# Patient Record
Sex: Female | Born: 1955 | Race: White | Hispanic: No | Marital: Married | Smoking: Current every day smoker
Health system: Southern US, Community
[De-identification: ages and names within clinical notes are randomized; demographics above are authoritative.]

## PROBLEM LIST (undated history)

## (undated) DIAGNOSIS — I10 Essential (primary) hypertension: Secondary | ICD-10-CM

## (undated) DIAGNOSIS — Z720 Tobacco use: Secondary | ICD-10-CM

## (undated) DIAGNOSIS — R918 Other nonspecific abnormal finding of lung field: Secondary | ICD-10-CM

## (undated) DIAGNOSIS — E785 Hyperlipidemia, unspecified: Secondary | ICD-10-CM

## (undated) DIAGNOSIS — I2510 Atherosclerotic heart disease of native coronary artery without angina pectoris: Secondary | ICD-10-CM

## (undated) HISTORY — DX: Hyperlipidemia, unspecified: E78.5

---

## 1998-08-23 ENCOUNTER — Encounter: Payer: Self-pay | Admitting: *Deleted

## 1998-08-23 ENCOUNTER — Emergency Department (HOSPITAL_COMMUNITY): Admission: EM | Admit: 1998-08-23 | Discharge: 1998-08-23 | Payer: Self-pay | Admitting: Emergency Medicine

## 1999-05-08 ENCOUNTER — Encounter: Payer: Self-pay | Admitting: Family Medicine

## 1999-05-08 ENCOUNTER — Inpatient Hospital Stay (HOSPITAL_COMMUNITY): Admission: EM | Admit: 1999-05-08 | Discharge: 1999-05-09 | Payer: Self-pay | Admitting: Emergency Medicine

## 1999-05-08 ENCOUNTER — Encounter: Payer: Self-pay | Admitting: Emergency Medicine

## 1999-05-09 ENCOUNTER — Encounter: Payer: Self-pay | Admitting: Internal Medicine

## 1999-05-10 ENCOUNTER — Emergency Department (HOSPITAL_COMMUNITY): Admission: EM | Admit: 1999-05-10 | Discharge: 1999-05-10 | Payer: Self-pay | Admitting: Emergency Medicine

## 2001-06-04 ENCOUNTER — Other Ambulatory Visit: Admission: RE | Admit: 2001-06-04 | Discharge: 2001-06-04 | Payer: Self-pay | Admitting: *Deleted

## 2002-11-18 ENCOUNTER — Other Ambulatory Visit: Admission: RE | Admit: 2002-11-18 | Discharge: 2002-11-18 | Payer: Self-pay | Admitting: *Deleted

## 2003-12-01 ENCOUNTER — Other Ambulatory Visit: Admission: RE | Admit: 2003-12-01 | Discharge: 2003-12-01 | Payer: Self-pay | Admitting: *Deleted

## 2005-05-05 ENCOUNTER — Other Ambulatory Visit: Admission: RE | Admit: 2005-05-05 | Discharge: 2005-05-05 | Payer: Self-pay | Admitting: Family Medicine

## 2006-09-18 ENCOUNTER — Other Ambulatory Visit: Admission: RE | Admit: 2006-09-18 | Discharge: 2006-09-18 | Payer: Self-pay | Admitting: *Deleted

## 2008-03-09 ENCOUNTER — Other Ambulatory Visit: Admission: RE | Admit: 2008-03-09 | Discharge: 2008-03-09 | Payer: Self-pay | Admitting: Family Medicine

## 2009-10-12 ENCOUNTER — Other Ambulatory Visit: Admission: RE | Admit: 2009-10-12 | Discharge: 2009-10-12 | Payer: Self-pay | Admitting: Family Medicine

## 2010-08-16 NOTE — H&P (Signed)
[place]  Patient:    Kari Harrison, Kari Harrison                         MRN: [id number] Adm. Date:  [id number] Attending:  Lorre Nick                         History and Physical  DIAGNOSES: 1. Epigastric pain, rule out gallstones, rule out cardiac etiology. 2. Poorly controlled hypertension.  HISTORY OF PRESENT ILLNESS:  Kari Harrison is a 55 year old white female admitted with epigastric pain in usual state of health until about two days prior to admission.  She was kept awake all night with heartburn, Tums did not give relief. She had epigastric pain at that time, which was constant, radiating to the left  lateral chest at times, and food seemed to increase symptoms.  No previous history of similar pain.  No diaphoresis, was having nausea but no vomiting.  No fever,  constipation, diarrhea, melena, or hematochezia.  Family history positive for gallstones in mother.  She came to [facility] Room where it was felt there would be need for admission to rule out MI.  PAST MEDICAL HISTORY:  Hospitalizations, operations:  Childbirth x 2 in the past, one vaginal delivery, one cesarean section in 1985.  ______ history of hypertension but she simply stopped taking medicines a year or two ago and does not really followup on blood pressures.  ALLERGIES:  No known drug allergies.  SOCIAL HISTORY:  She smokes about two or three cigarettes a day for three years. No alcohol use.  Employed as an Film/video editor.  Mother and sister accompany her to the emergency room.  FAMILY HISTORY:  Mother with gallstones, sister with high blood pressure. Father with MI and died at age 11.  REVIEW OF SYSTEMS:  Normal cholesterol in past by patient history.  Denies any history of respiratory disease, diabetes, kidney, or liver disease.  LMP two weeks ago.  It is 28-day, approximately 6 days in length.  She states she has a history of a heart murmur and had an  echocardiogram last year that she states was normal, although report is not on the chart.  OBJECTIVE:  VITAL SIGNS:  Blood pressure 170/108 after p.o. clonidine, pulse 80.  GENERAL:  Alert, oriented x 3.  HEENT:  Normocephalic, atraumatic.  EOMI nonicteric.  Posterior pharynx clear.  NECK:  Without JVD.  Thyroid normal.  RESPIRATORY:  Clear.  HEART:  RRR.  Normal S1, S2 with a grade 2/6 early systolic ejection murmur at eft sternal border.  ABDOMEN:  Tender to palpate in the epigastric region but no rigidity, guarding r rebound.  No HSM.  Slightly decreased bowel sounds.  NEUROLOGICAL:  Intact.  EXTREMITIES:  Without CCE.  RECTAL:  Stool [place], guaiac-negative.  ADMISSION LABORATORY DATA:  White count mildly elevated at 12.1, normal hemoglobin 14.6.  Liver functions normal.  BUN 14, creatinine 0.9, sodium was 3.5. Troponin level normal at 0.03.  CK 31, MB fraction 0.9, is normal.  Chest x-ray:  No acute disease.  ECG:  Normal sinus rhythm.  LVH present by voltage criteria with T wave inversions in the inferior and lateral leads consistent with chronic processes such as LDH, although I cannot rule out acute process.  ASSESSMENT AND PLAN: 1. Epigastric discomfort, probably gallstones, arrange for ultrasound.  IV Demerol,    on Pepcid, n.p.o.  Doubt a cardiac etiology  but Dr. Logan Bores has been consulted.    Cardiac enzymes are pending.  Follow on a monitor bed. 2. Poorly controlled hypertension.  N.P.O. for now.  Begin IV Vasotec.  Check    thyroid functions.  May need repeat echocardiogram to evaluate heart murmur. DD:  05/08/99 TD:  05/08/99 Job: [id number]

## 2011-09-23 ENCOUNTER — Other Ambulatory Visit: Payer: Self-pay | Admitting: Family Medicine

## 2011-09-23 DIAGNOSIS — R109 Unspecified abdominal pain: Secondary | ICD-10-CM

## 2011-09-24 ENCOUNTER — Ambulatory Visit
Admission: RE | Admit: 2011-09-24 | Discharge: 2011-09-24 | Disposition: A | Discharge status: Home / Self Care | Payer: Managed Care, Other (non HMO) | Source: Ambulatory Visit | Attending: Family Medicine | Admitting: Family Medicine

## 2011-09-24 DIAGNOSIS — R109 Unspecified abdominal pain: Secondary | ICD-10-CM

## 2011-09-24 MED ORDER — IOHEXOL 300 MG/ML  SOLN
100.0000 mL | Freq: Once | INTRAMUSCULAR | Status: AC | PRN
  Administered 2011-09-24: 100 mL via INTRAVENOUS

## 2011-11-21 ENCOUNTER — Other Ambulatory Visit: Payer: Self-pay | Admitting: Family Medicine

## 2011-11-21 ENCOUNTER — Other Ambulatory Visit (HOSPITAL_COMMUNITY)
Admission: RE | Admit: 2011-11-21 | Discharge: 2011-11-21 | Disposition: A | Discharge status: Home / Self Care | Payer: Managed Care, Other (non HMO) | Source: Ambulatory Visit | Attending: Family Medicine | Admitting: Family Medicine

## 2011-11-21 DIAGNOSIS — M8580 Other specified disorders of bone density and structure, unspecified site: Secondary | ICD-10-CM

## 2011-11-21 DIAGNOSIS — Z124 Encounter for screening for malignant neoplasm of cervix: Secondary | ICD-10-CM | POA: Insufficient documentation

## 2011-11-21 DIAGNOSIS — Z1151 Encounter for screening for human papillomavirus (HPV): Secondary | ICD-10-CM | POA: Insufficient documentation

## 2011-11-21 DIAGNOSIS — Z1231 Encounter for screening mammogram for malignant neoplasm of breast: Secondary | ICD-10-CM

## 2012-01-16 ENCOUNTER — Ambulatory Visit
Admission: RE | Admit: 2012-01-16 | Discharge: 2012-01-16 | Disposition: A | Discharge status: Home / Self Care | Payer: Managed Care, Other (non HMO) | Source: Ambulatory Visit | Attending: Family Medicine | Admitting: Family Medicine

## 2012-01-16 DIAGNOSIS — M8580 Other specified disorders of bone density and structure, unspecified site: Secondary | ICD-10-CM

## 2012-01-16 DIAGNOSIS — Z1231 Encounter for screening mammogram for malignant neoplasm of breast: Secondary | ICD-10-CM

## 2013-12-23 ENCOUNTER — Other Ambulatory Visit: Payer: Self-pay | Admitting: Family Medicine

## 2013-12-23 DIAGNOSIS — M8580 Other specified disorders of bone density and structure, unspecified site: Secondary | ICD-10-CM

## 2013-12-23 DIAGNOSIS — Z1231 Encounter for screening mammogram for malignant neoplasm of breast: Secondary | ICD-10-CM

## 2014-01-27 ENCOUNTER — Ambulatory Visit
Admission: RE | Admit: 2014-01-27 | Discharge: 2014-01-27 | Disposition: A | Discharge status: Home / Self Care | Payer: PRIVATE HEALTH INSURANCE | Source: Ambulatory Visit | Attending: Family Medicine | Admitting: Family Medicine

## 2014-01-27 DIAGNOSIS — M8580 Other specified disorders of bone density and structure, unspecified site: Secondary | ICD-10-CM

## 2014-01-27 DIAGNOSIS — Z1231 Encounter for screening mammogram for malignant neoplasm of breast: Secondary | ICD-10-CM

## 2014-06-14 ENCOUNTER — Inpatient Hospital Stay (HOSPITAL_COMMUNITY)
Admission: EM | Admit: 2014-06-14 | Discharge: 2014-06-17 | DRG: 247 | Disposition: A | Discharge status: Home / Self Care | Payer: 59 | Attending: Cardiology | Admitting: Cardiology

## 2014-06-14 ENCOUNTER — Emergency Department (HOSPITAL_COMMUNITY): Payer: 59

## 2014-06-14 ENCOUNTER — Encounter (HOSPITAL_COMMUNITY): Payer: Self-pay | Admitting: Emergency Medicine

## 2014-06-14 DIAGNOSIS — R079 Chest pain, unspecified: Secondary | ICD-10-CM

## 2014-06-14 DIAGNOSIS — Z7902 Long term (current) use of antithrombotics/antiplatelets: Secondary | ICD-10-CM

## 2014-06-14 DIAGNOSIS — Z8249 Family history of ischemic heart disease and other diseases of the circulatory system: Secondary | ICD-10-CM

## 2014-06-14 DIAGNOSIS — Z720 Tobacco use: Secondary | ICD-10-CM

## 2014-06-14 DIAGNOSIS — Z7982 Long term (current) use of aspirin: Secondary | ICD-10-CM

## 2014-06-14 DIAGNOSIS — F17210 Nicotine dependence, cigarettes, uncomplicated: Secondary | ICD-10-CM | POA: Diagnosis present

## 2014-06-14 DIAGNOSIS — R918 Other nonspecific abnormal finding of lung field: Secondary | ICD-10-CM | POA: Diagnosis present

## 2014-06-14 DIAGNOSIS — I10 Essential (primary) hypertension: Secondary | ICD-10-CM | POA: Diagnosis present

## 2014-06-14 DIAGNOSIS — I25110 Atherosclerotic heart disease of native coronary artery with unstable angina pectoris: Principal | ICD-10-CM | POA: Diagnosis present

## 2014-06-14 DIAGNOSIS — I2510 Atherosclerotic heart disease of native coronary artery without angina pectoris: Secondary | ICD-10-CM

## 2014-06-14 DIAGNOSIS — I200 Unstable angina: Secondary | ICD-10-CM

## 2014-06-14 DIAGNOSIS — Z79899 Other long term (current) drug therapy: Secondary | ICD-10-CM

## 2014-06-14 DIAGNOSIS — I2584 Coronary atherosclerosis due to calcified coronary lesion: Secondary | ICD-10-CM | POA: Diagnosis present

## 2014-06-14 HISTORY — DX: Tobacco use: Z72.0

## 2014-06-14 HISTORY — DX: Essential (primary) hypertension: I10

## 2014-06-14 HISTORY — DX: Atherosclerotic heart disease of native coronary artery without angina pectoris: I25.10

## 2014-06-14 HISTORY — DX: Other nonspecific abnormal finding of lung field: R91.8

## 2014-06-14 LAB — CREATININE, SERUM
CREATININE: 1.06 mg/dL (ref 0.50–1.10)
GFR calc Af Amer: 65 mL/min — ABNORMAL LOW (ref >90)
GFR, EST NON AFRICAN AMERICAN: 56 mL/min — AB (ref >90)

## 2014-06-14 LAB — I-STAT TROPONIN, ED
TROPONIN I, POC: 0.01 ng/mL (ref 0.00–0.08)
Troponin i, poc: 0 ng/mL (ref 0.00–0.08)

## 2014-06-14 LAB — BASIC METABOLIC PANEL
Anion gap: 8 (ref 5–15)
BUN: 14 mg/dL (ref 6–23)
CO2: 28 mmol/L (ref 19–32)
Calcium: 9.1 mg/dL (ref 8.4–10.5)
Chloride: 103 mmol/L (ref 96–112)
Creatinine, Ser: 0.91 mg/dL (ref 0.50–1.10)
GFR calc Af Amer: 78 mL/min — ABNORMAL LOW (ref >90)
GFR, EST NON AFRICAN AMERICAN: 68 mL/min — AB (ref >90)
GLUCOSE: 101 mg/dL — AB (ref 70–99)
Potassium: 3.4 mmol/L — ABNORMAL LOW (ref 3.5–5.1)
Sodium: 139 mmol/L (ref 135–145)

## 2014-06-14 LAB — CBC
HCT: 42.4 % (ref 36.0–46.0)
HEMATOCRIT: 43.3 % (ref 36.0–46.0)
Hemoglobin: 14.6 g/dL (ref 12.0–15.0)
Hemoglobin: 14.1 g/dL (ref 12.0–15.0)
MCH: 31.8 pg (ref 26.0–34.0)
MCH: 31.2 pg (ref 26.0–34.0)
MCHC: 33.7 g/dL (ref 30.0–36.0)
MCHC: 33.3 g/dL (ref 30.0–36.0)
MCV: 94.3 fL (ref 78.0–100.0)
MCV: 93.8 fL (ref 78.0–100.0)
PLATELETS: 295 10*3/uL (ref 150–400)
Platelets: 295 10*3/uL (ref 150–400)
RBC: 4.59 MIL/uL (ref 3.87–5.11)
RBC: 4.52 MIL/uL (ref 3.87–5.11)
RDW: 13.6 % (ref 11.5–15.5)
RDW: 13.6 % (ref 11.5–15.5)
WBC: 16.2 10*3/uL — AB (ref 4.0–10.5)
WBC: 13.8 10*3/uL — ABNORMAL HIGH (ref 4.0–10.5)

## 2014-06-14 LAB — TROPONIN I
Troponin I: <0.03 ng/mL (ref <0.031)
Troponin I: <0.03 ng/mL (ref <0.031)

## 2014-06-14 LAB — HEPATIC FUNCTION PANEL
ALBUMIN: 3.3 g/dL — AB (ref 3.5–5.2)
ALT: 18 U/L (ref 0–35)
AST: 20 U/L (ref 0–37)
Alkaline Phosphatase: 80 U/L (ref 39–117)
Bilirubin, Direct: 0.3 mg/dL (ref 0.0–0.5)
Indirect Bilirubin: 0.8 mg/dL (ref 0.3–0.9)
Total Bilirubin: 1.1 mg/dL (ref 0.3–1.2)
Total Protein: 6.2 g/dL (ref 6.0–8.3)

## 2014-06-14 MED ORDER — HEPARIN SODIUM (PORCINE) 5000 UNIT/ML IJ SOLN
5000.0000 [IU] | Freq: Three times a day (TID) | INTRAMUSCULAR | Status: DC
  Filled 2014-06-14: qty 1

## 2014-06-14 MED ORDER — NITROGLYCERIN 0.4 MG SL SUBL: 0.4000 mg | SUBLINGUAL_TABLET | SUBLINGUAL | Status: DC | PRN

## 2014-06-14 MED ORDER — VENLAFAXINE HCL ER 75 MG PO CP24
75.0000 mg | ORAL_CAPSULE | Freq: Every day | ORAL | Status: DC
  Administered 2014-06-16 – 2014-06-17 (×2): 75 mg via ORAL
  Filled 2014-06-14 (×5): qty 1

## 2014-06-14 MED ORDER — ATORVASTATIN CALCIUM 10 MG PO TABS: 10.0000 mg | ORAL_TABLET | Freq: Every day | ORAL | Status: DC

## 2014-06-14 MED ORDER — ASPIRIN EC 81 MG PO TBEC
81.0000 mg | DELAYED_RELEASE_TABLET | Freq: Every day | ORAL | Status: DC
  Filled 2014-06-14: qty 1

## 2014-06-14 MED ORDER — SODIUM CHLORIDE 0.9 % IV BOLUS (SEPSIS)
500.0000 mL | Freq: Once | INTRAVENOUS | Status: AC
  Administered 2014-06-14: 500 mL via INTRAVENOUS

## 2014-06-14 MED ORDER — ATENOLOL 50 MG PO TABS
50.0000 mg | ORAL_TABLET | Freq: Every day | ORAL | Status: DC
  Administered 2014-06-16 – 2014-06-17 (×2): 50 mg via ORAL
  Filled 2014-06-14: qty 2
  Filled 2014-06-14 (×2): qty 1

## 2014-06-14 MED ORDER — ACETAMINOPHEN 325 MG PO TABS: 650.0000 mg | ORAL_TABLET | ORAL | Status: DC | PRN

## 2014-06-14 MED ORDER — ONDANSETRON HCL 4 MG/2ML IJ SOLN: 4.0000 mg | Freq: Four times a day (QID) | INTRAMUSCULAR | Status: DC | PRN

## 2014-06-14 NOTE — ED Notes (Signed)
Cardiology at bedside.

## 2014-06-14 NOTE — Consult Note (Signed)
CARDIOLOGY CONSULT NOTE  Patient ID: Kari Harrison, MRN: [id number], DOB/AGE: 07/20/1955 59 y.o. Admit date: 06/14/2014 Date of Consult: 06/14/2014  Primary Physician: No primary care provider on file. Primary Cardiologist: None Referring Physician: ED Physician   Chief Complaint: Chest Pain Reason for Consultation: Chest Pain   HPI:  Kari Harrison is a 59yo woman with PMHx of HTN who presents to the ED with chest pain. 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 She describes the pain as "tight and heavy", radiating to her right chest, lasting about 15 minutes, and constant. She states after the pain started she went to lay down in the back of the store, but this did not reliever her pain. She then took 6 baby aspirin and this relieved her pain 10 minutes later. She describes associated diaphoresis, but denies dyspnea, nausea, vomiting, palpitations. She denies any chest pain or dyspnea with exertion. She had a nuclear stress test in 2001 which showed EF 55% and no evidence of ischemia or infarction. Of note, patient reports she had been to the dentist this morning for a tooth filling and received laughing gas and novacaine for the procedure. She states she is a smoker and has smoked 1/2 ppd for the last 20 years.   Medical History:  Past Medical History  Diagnosis Date  . Hypertension       Surgical History:  Past Surgical History  Procedure Laterality Date  . Cesarean section       Home Meds: Prior to Admission medications   Medication Sig Start Date End Date Taking? Authorizing Provider  aspirin 81 MG tablet Take 486 mg by mouth once.    Yes Historical Provider, MD  atenolol (TENORMIN) 50 MG tablet Take 50 mg by mouth daily. 06/12/14  Yes Historical Provider, MD  atorvastatin (LIPITOR) 10 MG tablet Take 10 mg by mouth daily. 06/12/14  Yes Historical Provider, MD    lisinopril-hydrochlorothiazide (PRINZIDE,ZESTORETIC) 20-12.5 MG per tablet Take 2 tablets by mouth daily. 06/12/14  Yes Historical Provider, MD  venlafaxine XR (EFFEXOR-XR) 75 MG 24 hr capsule Take 75 mg by mouth daily. 06/12/14  Yes Historical Provider, MD   Allergies: No Known Allergies  History   Social History  . Marital Status: Married    Spouse Name: N/A  . Number of Children: N/A  . Years of Education: N/A   Occupational History  . Not on file.   Social History Main Topics  . Smoking status: Current Every Day Smoker    Types: Cigarettes  . Smokeless tobacco: Not on file  . Alcohol Use: No  . Drug Use: Not on file  . Sexual Activity: Not on file   Other Topics Concern  . Not on file   Social History Narrative  . No narrative on file     No family history on file.   Review of Systems: General: negative for chills, fever, night sweats or weight changes.  ENT: negative for rhinorrhea or epistaxis Cardiovascular: See HPI Dermatological: negative for rash Respiratory: negative for cough or wheezing GI: negative for diarrhea, bright red blood per rectum, melena, or hematemesis GU: no hematuria, urgency, or frequency Neurologic: negative for visual changes, syncope, headache, or dizziness Heme: no easy bruising or bleeding Endo: negative for excessive thirst, thyroid disorder, or flushing Musculoskeletal: negative for joint pain or swelling, negative for myalgias  All other systems reviewed and are otherwise  negative except as noted above.  Physical Exam: Blood pressure 92/56, pulse 65, temperature 98.8 F (37.1 C), temperature source Oral, resp. rate 19, height 5\' 2"  (1.575 m), weight 170 lb (77.111 kg), SpO2 96 %. General: alert, sitting up in bed, NAD HEENT: [place]/AT, EOMI, mucus membranes moist Neck: supple, no JVD CV: RRR, no m/g/r Pulm: CTA bilaterally, breaths non-labored Abd: BS+, soft, non-tender, non-distended  Ext: warm, no edema Neuro: alert and  oriented x 3, no focal deficits     Labs: No results for input(s): CKTOTAL, CKMB, TROPONINI in the last 72 hours. Lab Results  Component Value Date   WBC 16.2* 06/14/2014   HGB 14.6 06/14/2014   HCT 43.3 06/14/2014   MCV 94.3 06/14/2014   PLT 295 06/14/2014    Recent Labs Lab 06/14/14 1343  NA 139  K 3.4*  CL 103  CO2 28  BUN 14  CREATININE 0.91  CALCIUM 9.1  PROT 6.2  BILITOT 1.1  ALKPHOS 80  ALT 18  AST 20  GLUCOSE 101*   Radiology/Studies:  Dg Chest Port 1 View  06/14/2014   CLINICAL DATA:  Mid chest pain and some shortness of breath. Bronchitis 3 weeks ago. Smoker.  EXAM: PORTABLE CHEST - 1 VIEW  COMPARISON:  05/09/2009.  FINDINGS: Normal sized heart. Clear lungs. Mildly prominent pulmonary vasculature. No pleural fluid. Unremarkable bones.  IMPRESSION: Interval mild pulmonary vascular congestion.   Electronically Signed   By: Beckie SaltsSteven  Reid M.D.   On: 06/14/2014 13:43    EKG:  Sinus rhythm, T wave inversions in I, II, V2-V6, prolonged QTc, no prior to compare   Cardiac Studies: None   ASSESSMENT AND PLAN:   Chest Pain: Patient presented with acute onset substernal chest pain at rest concerning for unstable angina. Initial istat troponin negative, but EKG shows T wave inversions. Chest x-ray with pulmonary edema, but no evidence of fluid overload on exam. Will trend troponins and admit to cardiology service. May need cath if chest pain reoccurs or troponin becomes elevated.   Paula ComptonSigned, Rivet, Carly MD 06/14/2014, 3:30 PM   Patient seen with resident, agree with the above note.  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  No prior chest pain.  Baseline good exercise tolerance.  She has multiple cardiac RFs including HTN, smoking, hyperlipidemia, and a father with MI at 3336.  Her ECG shows rather diffuse T wave inversions (no comparison). Initial troponin is negative.  Given her symptoms,  ECG, and RFs, I am concerned that her pain may have been ischemic.  - Admit and cycle enzymes.   - Will keep NPO at midnight. If troponin remains negative, will need Cardiolite or cardiac CTA.  If troponin positive, will need cardiac cath.  - Treat with ASA, statin, atenolol (home med) for now.  If she has recurrent pain or troponin returns positive, she will need to go on heparin gtt.   Marca AnconaDalton McLean 06/14/2014 4:47 PM

## 2014-06-14 NOTE — ED Provider Notes (Signed)
CSN: [id number]     Arrival date & time 06/14/14  1258 History   First MD Initiated Contact with Patient 06/14/14 1307     Chief Complaint  Patient presents with  . Chest Pain     Patient is a 59 y.o. female presenting with chest pain. The history is provided by the patient. No language interpreter was used.  Chest Pain  Ms. Scogin presents for evaluation of chest pain.  Pain started about two hours ago.  The pain is described as pressing and tight from the center of the chest and right side of chest.  She has associated diaphoresis, pallor.  She took six baby aspirin prior to EMS arrival.  The pain lasted about fifteen minutes and occurred at rest.  No associated SOB, abdominal pain, nausea, vomiting, leg swelling or pain.  Pain is completely resolved currently.  She had a tooth filled this morning at the dentist office and had laughing gas and novacaine.  She does not take any oral estrogen therapy. No history of cardiac disease, hyperlipidemia, DVT or PE.   Past Medical History  Diagnosis Date  . Hypertension    Past Surgical History  Procedure Laterality Date  . Cesarean section     No family history on file. History  Substance Use Topics  . Smoking status: Current Every Day Smoker    Types: Cigarettes  . Smokeless tobacco: Not on file  . Alcohol Use: No   OB History    No data available     Review of Systems  Cardiovascular: Positive for chest pain.  All other systems reviewed and are negative.     Allergies  Review of patient's allergies indicates no known allergies.  Home Medications   Prior to Admission medications   Not on File   BP 99/69 mmHg  Temp(Src) 98.8 F (37.1 C) (Oral)  Resp 16  Ht  (1.575 m)  Wt 170 lb (77.111 kg)  BMI 31.09 kg/m2  SpO2 91% Physical Exam  Constitutional: She is oriented to person, place, and time. She appears well-developed and well-nourished.  HENT:  Head: Normocephalic and atraumatic.  Cardiovascular: Normal rate  and regular rhythm.   No murmur heard. Pulmonary/Chest: Effort normal and breath sounds normal. No respiratory distress.  Abdominal: Soft. There is no tenderness. There is no rebound and no guarding.  Musculoskeletal: She exhibits no edema or tenderness.  Neurological: She is alert and oriented to person, place, and time.  Skin: Skin is warm and dry.  Psychiatric: She has a normal mood and affect. Her behavior is normal.  Nursing note and vitals reviewed.   ED Course  Procedures (including critical care time) Labs Review Labs Reviewed  CBC - Abnormal; Notable for the following:    WBC 16.2 (*)    All other components within normal limits  BASIC METABOLIC PANEL - Abnormal; Notable for the following:    Potassium 3.4 (*)    Glucose, Bld 101 (*)    GFR calc non Af Amer 68 (*)    GFR calc Af Amer 78 (*)    All other components within normal limits  HEPATIC FUNCTION PANEL - Abnormal; Notable for the following:    Albumin 3.3 (*)    All other components within normal limits  Rosezena Sensor, ED    Imaging Review Dg Chest Port 1 View  06/14/2014   CLINICAL DATA:  Mid chest pain and some shortness of breath. Bronchitis 3 weeks ago. Smoker.  EXAM: PORTABLE CHEST -  1 VIEW  COMPARISON:  05/09/2009.  FINDINGS: Normal sized heart. Clear lungs. Mildly prominent pulmonary vasculature. No pleural fluid. Unremarkable bones.  IMPRESSION: Interval mild pulmonary vascular congestion.   Electronically Signed   By: Beckie SaltsSteven  Reid M.D.   On: 06/14/2014 13:43     EKG Interpretation   Date/Time:  Wednesday June 14 2014 13:04:20 EDT Ventricular Rate:  69 PR Interval:  154 QRS Duration: 80 QT Interval:  535 QTC Calculation: 573 R Axis:   46 Text Interpretation:  Sinus rhythm Repol abnrm suggests ischemia,  anterolateral Prolonged QT interval Confirmed by Lincoln Brighamees, Liz [phone]([id number]) on  06/14/2014 1:28:20 PM      MDM   Final diagnoses:  Chest pain, unspecified chest pain type    Patient here for  valuation of episodic chest pain currently resolved, EKG is abnormal with T-wave inversion in lateral leads. Patient does have prior EKG with similar but slightly different changes. CBC with leukocytosis but there is no current evidence of acute infectious process, clinical picture is not consistent with CHF. Patient received ASA prior to ED arrival. Nitroglycerin ointment held given patient's marginal blood pressures. Discussed with cards regarding evaluation for ACS rule out.  Tilden FossaElizabeth Jean Skow, MD 06/14/14 [phone]

## 2014-06-14 NOTE — ED Notes (Addendum)
EMS - Patient was a work with sudden onset of central and right sided chest pain that lasted 15-20 minutes in duration.  Initially 10/10 pain on arrival to ED 0/10 pain.  Patient took 324mg  Aspirin prior to calling EMS. Pt states she has no pain at this time. Denies any N/V.

## 2014-06-15 ENCOUNTER — Observation Stay (HOSPITAL_COMMUNITY): Payer: 59

## 2014-06-15 ENCOUNTER — Encounter (HOSPITAL_COMMUNITY): Payer: Self-pay | Admitting: Interventional Cardiology

## 2014-06-15 DIAGNOSIS — R079 Chest pain, unspecified: Secondary | ICD-10-CM

## 2014-06-15 DIAGNOSIS — I209 Angina pectoris, unspecified: Secondary | ICD-10-CM | POA: Insufficient documentation

## 2014-06-15 DIAGNOSIS — I2510 Atherosclerotic heart disease of native coronary artery without angina pectoris: Secondary | ICD-10-CM

## 2014-06-15 DIAGNOSIS — Z720 Tobacco use: Secondary | ICD-10-CM

## 2014-06-15 LAB — BASIC METABOLIC PANEL
Anion gap: 5 (ref 5–15)
BUN: 14 mg/dL (ref 6–23)
CO2: 28 mmol/L (ref 19–32)
Calcium: 8.9 mg/dL (ref 8.4–10.5)
Chloride: 105 mmol/L (ref 96–112)
Creatinine, Ser: 0.85 mg/dL (ref 0.50–1.10)
GFR calc Af Amer: 85 mL/min — ABNORMAL LOW (ref >90)
GFR calc non Af Amer: 74 mL/min — ABNORMAL LOW (ref >90)
Glucose, Bld: 117 mg/dL — ABNORMAL HIGH (ref 70–99)
Potassium: 3.4 mmol/L — ABNORMAL LOW (ref 3.5–5.1)
Sodium: 138 mmol/L (ref 135–145)

## 2014-06-15 LAB — TROPONIN I: Troponin I: <0.03 ng/mL (ref <0.031)

## 2014-06-15 LAB — PROTIME-INR
INR: 0.98 (ref 0.00–1.49)
Prothrombin Time: 13.1 seconds (ref 11.6–15.2)

## 2014-06-15 MED ORDER — METOPROLOL TARTRATE 1 MG/ML IV SOLN: 5.0000 mg | Freq: Once | INTRAVENOUS | Status: DC

## 2014-06-15 MED ORDER — SODIUM CHLORIDE 0.9 % IV SOLN
1.0000 mL/kg/h | INTRAVENOUS | Status: DC
  Administered 2014-06-15 – 2014-06-16 (×2): 1 mL/kg/h via INTRAVENOUS

## 2014-06-15 MED ORDER — SODIUM CHLORIDE 0.9 % IV SOLN: 250.0000 mL | INTRAVENOUS | Status: DC | PRN

## 2014-06-15 MED ORDER — NITROGLYCERIN 0.4 MG SL SUBL: 0.8000 mg | SUBLINGUAL_TABLET | Freq: Once | SUBLINGUAL | Status: DC

## 2014-06-15 MED ORDER — ENOXAPARIN SODIUM 80 MG/0.8ML ~~LOC~~ SOLN
80.0000 mg | SUBCUTANEOUS | Status: AC
  Administered 2014-06-15: 80 mg via SUBCUTANEOUS
  Filled 2014-06-15: qty 0.8

## 2014-06-15 MED ORDER — SODIUM CHLORIDE 0.9 % IJ SOLN: 3.0000 mL | INTRAMUSCULAR | Status: DC | PRN

## 2014-06-15 MED ORDER — SODIUM CHLORIDE 0.9 % IJ SOLN
3.0000 mL | Freq: Two times a day (BID) | INTRAMUSCULAR | Status: DC
  Administered 2014-06-16: 3 mL via INTRAVENOUS

## 2014-06-15 MED ORDER — IOHEXOL 350 MG/ML SOLN
80.0000 mL | Freq: Once | INTRAVENOUS | Status: AC | PRN
  Administered 2014-06-15: 80 mL via INTRAVENOUS

## 2014-06-15 MED ORDER — METOPROLOL TARTRATE 1 MG/ML IV SOLN
INTRAVENOUS | Status: AC
  Filled 2014-06-15: qty 5

## 2014-06-15 MED ORDER — NITROGLYCERIN 0.4 MG SL SUBL
SUBLINGUAL_TABLET | SUBLINGUAL | Status: AC
  Administered 2014-06-15: 0.4 mg via SUBLINGUAL
  Filled 2014-06-15: qty 2

## 2014-06-15 MED ORDER — ENOXAPARIN SODIUM 80 MG/0.8ML ~~LOC~~ SOLN
80.0000 mg | Freq: Two times a day (BID) | SUBCUTANEOUS | Status: DC
  Filled 2014-06-15 (×2): qty 0.8

## 2014-06-15 NOTE — Progress Notes (Signed)
Patient Name: Kari Harrison Date of Encounter: 06/15/2014     Active Problems:   Chest pain    SUBJECTIVE  No further CP. She would like to go home but willing to stay for further cardiac work up/  CURRENT MEDS . aspirin EC  81 mg Oral Daily  . atenolol  50 mg Oral Daily  . atorvastatin  10 mg Oral Daily  . heparin  5,000 Units Subcutaneous 3 times per day  . venlafaxine XR  75 mg Oral Daily    OBJECTIVE  Filed Vitals:   06/14/14 1630 06/14/14 1830 06/14/14 1935 06/15/14 0500  BP: 110/71 102/58 107/57 124/77  Pulse: 73 73 68 66  Temp:   98.5 F (36.9 C) 98.1 F (36.7 C)  TempSrc:   Oral Oral  Resp: 12 16    Height:    5\' 2"  (1.575 m)  Weight:    170 lb (77.111 kg)  SpO2: 97% 97% 96% 98%    Intake/Output Summary (Last 24 hours) at 06/15/14 0818 Last data filed at 06/14/14 1617  Gross per 24 hour  Intake    500 ml  Output      0 ml  Net    500 ml   Filed Weights   06/14/14 1313 06/15/14 0500  Weight: 170 lb (77.111 kg) 170 lb (77.111 kg)    PHYSICAL EXAM  General: Pleasant, NAD. Neuro: Alert and oriented X 3. Moves all extremities spontaneously. Psych: Normal affect. HEENT:  Normal  Neck: Supple without bruits or JVD. Lungs:  Resp regular and unlabored, CTA. Heart: brady no s3, s4, or murmurs. Abdomen: Soft, non-tender, non-distended, BS + x 4.  Extremities: No clubbing, cyanosis or edema. DP/PT/Radials 2+ and equal bilaterally.  Accessory Clinical Findings  CBC  Recent Labs  06/14/14 1343 06/14/14 2233  WBC 16.2* 13.8*  HGB 14.6 14.1  HCT 43.3 42.4  MCV 94.3 93.8  PLT 295 295   Basic Metabolic Panel  Recent Labs  06/14/14 1343 06/14/14 2233  NA 139  --   K 3.4*  --   CL 103  --   CO2 28  --   GLUCOSE 101*  --   BUN 14  --   CREATININE 0.91 1.06  CALCIUM 9.1  --    Liver Function Tests  Recent Labs  06/14/14 1343  AST 20  ALT 18  ALKPHOS 80  BILITOT 1.1  PROT 6.2  ALBUMIN 3.3*   No results for input(s): LIPASE,  AMYLASE in the last 72 hours. Cardiac Enzymes  Recent Labs  06/14/14 1633 06/14/14 2233 06/15/14 0417  TROPONINI <0.03 <0.03 <0.03    TELE  NSR with sinus brady  Radiology/Studies  Dg Chest Port 1 View  06/14/2014   CLINICAL DATA:  Mid chest pain and some shortness of breath. Bronchitis 3 weeks ago. Smoker.  EXAM: PORTABLE CHEST - 1 VIEW  COMPARISON:  05/09/2009.  FINDINGS: Normal sized heart. Clear lungs. Mildly prominent pulmonary vasculature. No pleural fluid. Unremarkable bones.  IMPRESSION: Interval mild pulmonary vascular congestion.   Electronically Signed   By: Beckie SaltsSteven  Reid M.D.   On: 06/14/2014 13:43    ASSESSMENT AND PLAN  Kari Harrison is a 59 y.o. female with a history of HTN, smoking, hyperlipidemia, and a father with MI at 3936 who [facility] with chest pain concerning for [place].   Chest pain  -- Troponin neg x3. ECG shows rather diffuse T wave inversions (no comparison).  -- Given her symptoms,  ECG, and RFs, there is concern that her pain may have been ischemic.  -- Per Dr. Shirlee Latch: If troponin remains negative, will need Cardiolite or cardiac CTA. If troponin positive, will need cardiac cath.  -- Continue ASA, statin, atenolol (home med) for now. -- Will arrange for inpatient Cardiac CT. Her HR is sinus brady at baseline on home atenelol. Will have 18G IV placed. I have spoke to Dr. Delton See who will read this.   Incidental pulmonary nodules seen on Cardiac CT overread- still awaiting report or coronary status. -- "Several pulmonary nodules in the left lung, as detailed above. While it is possible that these could be infectious/inflammatory, or related to areas of mucoid impaction within distal bronchioles, the possibility of malignancy should be considered. At this time, a repeat noncontrast chest CT is recommended in 3 months to assess for the stability or resolution of these findings."   Signed, Janetta Hora PA-C  Pager [phone]

## 2014-06-15 NOTE — Progress Notes (Signed)
Pt states she has taken her own home meds this evening.  Her husband brought them and she took them at 1830 today.  She took all her meds except lisinopril.  She took: Aspirin 81mg , Atenolol 50mg , Lipitor 10mg , Effexor-XR 75mg .  She also took Vitamin C and Vitamin D.  Pt was educated on the importance of letting the RN give her any and all medications.  Pt apologized and stated she did not know the policy but will not take her own meds on her own while hospitalized.

## 2014-06-15 NOTE — Progress Notes (Signed)
ANTICOAGULATION CONSULT NOTE - Initial Consult  Pharmacy Consult for Lovenox Indication: chest pain/ACS  No Known Allergies  Patient Measurements: Height: 5\' 2"  (157.5 cm) Weight: 170 lb (77.111 kg) IBW/kg (Calculated) : 50.1  Vital Signs: Temp: 98.6 F (37 C) (03/17 1327) Temp Source: Oral (03/17 1327) BP: 96/66 mmHg (03/17 1327) Pulse Rate: 59 (03/17 1327)  Labs:  Recent Labs  06/14/14 1343 06/14/14 1633 06/14/14 2233 06/15/14 0417  HGB 14.6  --  14.1  --   HCT 43.3  --  42.4  --   PLT 295  --  295  --   CREATININE 0.91  --  1.06  --   TROPONINI  --  <0.03 <0.03 <0.03    Estimated Creatinine Clearance: 54.9 mL/min (by C-G formula based on Cr of 1.06).   Medical History: Past Medical History  Diagnosis Date  . Hypertension   . CAD in native artery 06/15/2014    Medications:  Prescriptions prior to admission  Medication Sig Dispense Refill Last Dose  . aspirin 81 MG tablet Take 486 mg by mouth once.    06/14/2014 at Unknown time  . atenolol (TENORMIN) 50 MG tablet Take 50 mg by mouth daily.  0 06/13/2014 at 2330  . atorvastatin (LIPITOR) 10 MG tablet Take 10 mg by mouth daily.  0 06/13/2014 at Unknown time  . lisinopril-hydrochlorothiazide (PRINZIDE,ZESTORETIC) 20-12.5 MG per tablet Take 2 tablets by mouth daily.  0 06/13/2014 at Unknown time  . venlafaxine XR (EFFEXOR-XR) 75 MG 24 hr capsule Take 75 mg by mouth daily.  0 06/13/2014 at Unknown time    Assessment: 59 y/o female who presented to the ED with chest pain concerning for unstable angina. Pharmacy consulted to begin Lovenox. Plan is for cath. SQ heparin was ordered but none was given. No bleeding noted, CBC is normal, troponin is neg x3. Renal function is normal.  Goal of Therapy:  Anti-Xa level 0.6-1 units/ml 4hrs after LMWH dose given Monitor platelets by anticoagulation protocol: Yes   Plan:  - Lovenox 80 mg SQ q12h - CBC q72h while on Lovenox - Monitor for s/sx of bleeding - F/u after cath  tomorrow  [facility]ist Medical CenterJennifer Poland, Pharm.D., BCPS Clinical Pharmacist Pager: [phone] 06/15/2014 7:52 PM

## 2014-06-15 NOTE — Progress Notes (Signed)
UR completed 

## 2014-06-16 ENCOUNTER — Encounter (HOSPITAL_COMMUNITY)
Admission: EM | Disposition: A | Discharge status: Home / Self Care | Payer: PRIVATE HEALTH INSURANCE | Source: Home / Self Care | Attending: Cardiology

## 2014-06-16 ENCOUNTER — Encounter (HOSPITAL_COMMUNITY): Payer: Self-pay | Admitting: Internal Medicine

## 2014-06-16 DIAGNOSIS — Z8249 Family history of ischemic heart disease and other diseases of the circulatory system: Secondary | ICD-10-CM | POA: Diagnosis not present

## 2014-06-16 DIAGNOSIS — Z7902 Long term (current) use of antithrombotics/antiplatelets: Secondary | ICD-10-CM | POA: Diagnosis not present

## 2014-06-16 DIAGNOSIS — I200 Unstable angina: Secondary | ICD-10-CM

## 2014-06-16 DIAGNOSIS — R079 Chest pain, unspecified: Secondary | ICD-10-CM | POA: Diagnosis present

## 2014-06-16 DIAGNOSIS — I2584 Coronary atherosclerosis due to calcified coronary lesion: Secondary | ICD-10-CM | POA: Diagnosis present

## 2014-06-16 DIAGNOSIS — F17210 Nicotine dependence, cigarettes, uncomplicated: Secondary | ICD-10-CM | POA: Diagnosis present

## 2014-06-16 DIAGNOSIS — I25110 Atherosclerotic heart disease of native coronary artery with unstable angina pectoris: Principal | ICD-10-CM

## 2014-06-16 DIAGNOSIS — R918 Other nonspecific abnormal finding of lung field: Secondary | ICD-10-CM | POA: Diagnosis present

## 2014-06-16 DIAGNOSIS — I10 Essential (primary) hypertension: Secondary | ICD-10-CM | POA: Diagnosis present

## 2014-06-16 DIAGNOSIS — I2510 Atherosclerotic heart disease of native coronary artery without angina pectoris: Secondary | ICD-10-CM | POA: Diagnosis not present

## 2014-06-16 DIAGNOSIS — I209 Angina pectoris, unspecified: Secondary | ICD-10-CM | POA: Diagnosis not present

## 2014-06-16 DIAGNOSIS — Z7982 Long term (current) use of aspirin: Secondary | ICD-10-CM | POA: Diagnosis not present

## 2014-06-16 DIAGNOSIS — Z79899 Other long term (current) drug therapy: Secondary | ICD-10-CM | POA: Diagnosis not present

## 2014-06-16 HISTORY — PX: LEFT HEART CATHETERIZATION WITH CORONARY ANGIOGRAM: SHX5451

## 2014-06-16 LAB — LIPID PANEL
CHOL/HDL RATIO: 4 ratio
CHOLESTEROL: 136 mg/dL (ref 0–200)
HDL: 34 mg/dL — AB (ref >39)
LDL Cholesterol: 76 mg/dL (ref 0–99)
Triglycerides: 129 mg/dL (ref <150)
VLDL: 26 mg/dL (ref 0–40)

## 2014-06-16 LAB — BASIC METABOLIC PANEL
Anion gap: 5 (ref 5–15)
BUN: 11 mg/dL (ref 6–23)
CHLORIDE: 111 mmol/L (ref 96–112)
CO2: 26 mmol/L (ref 19–32)
CREATININE: 0.78 mg/dL (ref 0.50–1.10)
Calcium: 8.6 mg/dL (ref 8.4–10.5)
GFR calc Af Amer: >90 mL/min (ref >90)
GFR, EST NON AFRICAN AMERICAN: 90 mL/min — AB (ref >90)
Glucose, Bld: 93 mg/dL (ref 70–99)
Potassium: 3.7 mmol/L (ref 3.5–5.1)
Sodium: 142 mmol/L (ref 135–145)

## 2014-06-16 LAB — POCT ACTIVATED CLOTTING TIME
ACTIVATED CLOTTING TIME: 257 s
Activated Clotting Time: 368 seconds

## 2014-06-16 SURGERY — LEFT HEART CATHETERIZATION WITH CORONARY ANGIOGRAM
Anesthesia: LOCAL

## 2014-06-16 MED ORDER — SODIUM CHLORIDE 0.9 % IV SOLN: 1.0000 mL/kg/h | INTRAVENOUS | Status: AC

## 2014-06-16 MED ORDER — SODIUM CHLORIDE 0.9 % IV SOLN
0.2500 mg/kg/h | INTRAVENOUS | Status: DC
  Filled 2014-06-16: qty 250

## 2014-06-16 MED ORDER — BIVALIRUDIN 250 MG IV SOLR
INTRAVENOUS | Status: AC
  Filled 2014-06-16: qty 250

## 2014-06-16 MED ORDER — ASPIRIN 81 MG PO CHEW
CHEWABLE_TABLET | ORAL | Status: AC
  Filled 2014-06-16: qty 1

## 2014-06-16 MED ORDER — ASPIRIN 81 MG PO CHEW
81.0000 mg | CHEWABLE_TABLET | Freq: Every day | ORAL | Status: DC
  Administered 2014-06-17: 81 mg via ORAL
  Filled 2014-06-16: qty 1

## 2014-06-16 MED ORDER — NITROGLYCERIN 1 MG/10 ML FOR IR/CATH LAB
INTRA_ARTERIAL | Status: AC
  Filled 2014-06-16: qty 10

## 2014-06-16 MED ORDER — ATORVASTATIN CALCIUM 20 MG PO TABS
20.0000 mg | ORAL_TABLET | Freq: Every day | ORAL | Status: DC
  Administered 2014-06-16: 20 mg via ORAL
  Filled 2014-06-16 (×3): qty 1

## 2014-06-16 MED ORDER — HEPARIN (PORCINE) IN NACL 2-0.9 UNIT/ML-% IJ SOLN
INTRAMUSCULAR | Status: AC
Start: 2014-06-16 — End: 2014-06-16
  Filled 2014-06-16: qty 1000

## 2014-06-16 MED ORDER — ACETAMINOPHEN 325 MG PO TABS: 650.0000 mg | ORAL_TABLET | ORAL | Status: DC | PRN

## 2014-06-16 MED ORDER — MIDAZOLAM HCL 2 MG/2ML IJ SOLN
INTRAMUSCULAR | Status: AC
  Filled 2014-06-16: qty 2

## 2014-06-16 MED ORDER — FENTANYL CITRATE 0.05 MG/ML IJ SOLN
INTRAMUSCULAR | Status: AC
  Filled 2014-06-16: qty 2

## 2014-06-16 MED ORDER — HEPARIN SODIUM (PORCINE) 1000 UNIT/ML IJ SOLN
INTRAMUSCULAR | Status: AC
  Filled 2014-06-16: qty 1

## 2014-06-16 MED ORDER — CLOPIDOGREL BISULFATE 300 MG PO TABS
ORAL_TABLET | ORAL | Status: AC
Start: 2014-06-16 — End: 2014-06-16
  Filled 2014-06-16: qty 1

## 2014-06-16 MED ORDER — ONDANSETRON HCL 4 MG/2ML IJ SOLN: 4.0000 mg | Freq: Four times a day (QID) | INTRAMUSCULAR | Status: DC | PRN

## 2014-06-16 MED ORDER — CLOPIDOGREL BISULFATE 300 MG PO TABS
ORAL_TABLET | ORAL | Status: AC
  Filled 2014-06-16: qty 1

## 2014-06-16 MED ORDER — CLOPIDOGREL BISULFATE 75 MG PO TABS
75.0000 mg | ORAL_TABLET | Freq: Every day | ORAL | Status: DC
  Administered 2014-06-17: 09:00:00 75 mg via ORAL
  Filled 2014-06-16: qty 1

## 2014-06-16 MED ORDER — VERAPAMIL HCL 2.5 MG/ML IV SOLN
INTRAVENOUS | Status: AC
  Filled 2014-06-16: qty 2

## 2014-06-16 MED ORDER — LIDOCAINE HCL (PF) 1 % IJ SOLN
INTRAMUSCULAR | Status: AC
  Filled 2014-06-16: qty 30

## 2014-06-16 NOTE — CV Procedure (Signed)
Cardiac Cath Procedure Note:  Indication: Chest pain. Cardiac CT with coronary stenosis  Procedures performed:  1) Selective coronary angiography 2) Left heart catheterization 3) Left ventriculogram  Description of procedure:   The risks and indication of the procedure were explained. Consent was signed and placed on the chart. An appropriate timeout was taken prior to the procedure.  We initially started with a right radial approach but this was abandoned due to a radial loop. The right groin was then prepped and draped in the routine sterile fashion and anesthetized with 1% local lidocaine.   A 5 FR arterial sheath was placed in the right femoral artery using a modified Seldinger technique. Standard catheters including a JL4, JR4 and angled pigtail were used. All catheter exchanges were made over a wire.  Complications:  None apparent  Findings:  Ao Pressure: 106/59 (79) LV Pressure: 105/11/21 There was no signficant gradient across the aortic valve on pullback.  Left main: Mild calcification. No significant stenosis.   LAD: Long vessel wraps apex. 50-60% lesion in proximal to mid LAD prior to take-off of first diagonal. There is a 40% lesion in the mid LAD just after the D1 take-off. Mild plaque in distal LAD. In large D1 there is 50% lesion ostially and 50% lesion in midsection.  LCX:  Small ramus with moderate ostial plaque. Large dominant circumflex. Gives off large branching OM-1. 3 PLS and a PDA. In the proximal LCX there is a 20-30% stenosis. In the mid LCX there is a 50% lesion just after the take-off of the OM-1.  In the distal LCX there is a 60% lesion after the first PL and a 90% lesion prior to the 2nd PL. In the large OM-1 there is a 40-50% mid lesion   RCA: Moderate sized non-dominant vessel with moderate diffuse plaque.  LV-gram done in the RAO projection: Ejection fraction =  55% nor regional wall motion abnormalities  Assessment: 1. 3v CAD with high grade  lesion in distal portion of dominant LCX otherwise non-obstructive CAD. 2. Normal LVEF  Plan/Discussion:  PCI LCX. Aggressive risk factor management.   Arvilla Meresaniel Bensimhon MD 5:06 PM

## 2014-06-16 NOTE — Interval H&P Note (Signed)
History and Physical Interval Note:  06/16/2014 3:42 PM  Kari Harrison  has presented today for surgery, with the diagnosis of cp  The various methods of treatment have been discussed with the patient and family. After consideration of risks, benefits and other options for treatment, the patient has consented to  Procedure(s): LEFT HEART CATHETERIZATION WITH CORONARY ANGIOGRAM (N/A) and possible angioplasty as a surgical intervention .  The patient's history has been reviewed, patient examined, no change in status, stable for surgery.  I have reviewed the patient's chart and labs.  Questions were answered to the patient's satisfaction.     [name]

## 2014-06-16 NOTE — CV Procedure (Signed)
       PROCEDURE:  PCI distal left circumflex.  INDICATIONS:  Unstable angina  The risks, benefits, and details of the procedure were explained to the patient.  The patient verbalized understanding and wanted to proceed.  Informed written consent was obtained.  PROCEDURE TECHNIQUE: Dr. Jones BroomBensihmon performed the diagnostic cardiac cath which revealed severe distal circumflex disease.   An EBU 3.0 guide catheter was used to engage the left main. An EBU 3.5 would have fit better. A pro-water wire was placed across the disease in the circumflex. A 2.0 x 12 balloon was used to predilate. A 2.25 x 24 Promus drug-eluting stent was deployed across the area of disease in the distal circumflex. The stent was deployed. The stent was postdilated with a 2.5 x 12 noncompliant balloon, inflated high pressure. Both branches which were jailed had TIMI-3 flow.  Intracoronary nitroglycerin was given. There was an excellent angiographic result. Angio-Seal was used for  was used for hemostasisIn the right groin. A TR band had been placed on the right wrist as they attempted radial access but due to a loop, were unsuccessful.   CONTRAST:  Total of 95 cc.  COMPLICATIONS:  None.     IMPRESSIONS:  1. Successful PCI of the distal  left circumflex artery  with a 2.25 x 24 Promus drug-eluting stent, postdilated to greater than 2.5 mm in diameter. 2.   Unsuccessful attempt at diagnostic cardiac cath from the right radial approach due to radial loop.  RECOMMENDATION:  Continue dual antiplatelet therapy for at least a year. Continue aggressive secondary prevention.   She'll be watched overnight. Possible discharge tomorrow. Further cardiac care per Dr. Shirlee LatchMcLean or Dr. Katrinka BlazingSmith.

## 2014-06-16 NOTE — Progress Notes (Addendum)
   The official CT angiogram report demonstrates severe three-vessel coronary artery disease with the possibility of significant diagonal obstruction. Coronary angiography was recommended.  Discussed the final results of the coronary CTA. We discuss the coronary angioma, risks, and alternatives. She is agreeable to proceed.  Plan catheterization today and if no intervention, discharged with aggressive risk factor modification including smoking cessation.  Home later today if medical therapy and no intervention.

## 2014-06-16 NOTE — H&P (View-Only) (Signed)
   The official CT angiogram report demonstrates severe three-vessel coronary artery disease with the possibility of significant diagonal obstruction. Coronary angiography was recommended.  Discussed the final results of the coronary CTA. We discuss the coronary angioma, risks, and alternatives. She is agreeable to proceed.  Plan catheterization today and if no intervention, discharged with aggressive risk factor modification including smoking cessation.  Home later today if medical therapy and no intervention. 

## 2014-06-16 NOTE — Progress Notes (Signed)
Pt to cath lab for procedure via stretcher 

## 2014-06-16 NOTE — Progress Notes (Signed)
Pt received form [facility] west to the cath lab holding area for a cath procedure.  Pt alert and denies any discomfort at this time.  IVF infusing, consent signed  And pt on monitor.

## 2014-06-17 ENCOUNTER — Encounter (HOSPITAL_COMMUNITY): Payer: Self-pay | Admitting: Nurse Practitioner

## 2014-06-17 DIAGNOSIS — I10 Essential (primary) hypertension: Secondary | ICD-10-CM | POA: Diagnosis present

## 2014-06-17 DIAGNOSIS — R918 Other nonspecific abnormal finding of lung field: Secondary | ICD-10-CM | POA: Diagnosis present

## 2014-06-17 LAB — BASIC METABOLIC PANEL
ANION GAP: 7 (ref 5–15)
BUN: 11 mg/dL (ref 6–23)
CHLORIDE: 111 mmol/L (ref 96–112)
CO2: 22 mmol/L (ref 19–32)
CREATININE: 0.79 mg/dL (ref 0.50–1.10)
Calcium: 8.5 mg/dL (ref 8.4–10.5)
GFR calc Af Amer: >90 mL/min (ref >90)
GFR, EST NON AFRICAN AMERICAN: 89 mL/min — AB (ref >90)
Glucose, Bld: 118 mg/dL — ABNORMAL HIGH (ref 70–99)
Potassium: 4.1 mmol/L (ref 3.5–5.1)
SODIUM: 140 mmol/L (ref 135–145)

## 2014-06-17 LAB — CBC
HEMATOCRIT: 39.9 % (ref 36.0–46.0)
Hemoglobin: 13.2 g/dL (ref 12.0–15.0)
MCH: 31.1 pg (ref 26.0–34.0)
MCHC: 33.1 g/dL (ref 30.0–36.0)
MCV: 94.1 fL (ref 78.0–100.0)
PLATELETS: 272 10*3/uL (ref 150–400)
RBC: 4.24 MIL/uL (ref 3.87–5.11)
RDW: 13.5 % (ref 11.5–15.5)
WBC: 10.1 10*3/uL (ref 4.0–10.5)

## 2014-06-17 MED ORDER — ATORVASTATIN CALCIUM 80 MG PO TABS
80.0000 mg | ORAL_TABLET | Freq: Every day | ORAL | Status: DC
  Administered 2014-06-17: 09:00:00 80 mg via ORAL
  Filled 2014-06-17: qty 1

## 2014-06-17 MED ORDER — NITROGLYCERIN 0.4 MG SL SUBL: 0.4000 mg | SUBLINGUAL_TABLET | SUBLINGUAL | Status: DC | PRN

## 2014-06-17 MED ORDER — CLOPIDOGREL BISULFATE 75 MG PO TABS: 75.0000 mg | ORAL_TABLET | Freq: Every day | ORAL | Status: DC

## 2014-06-17 MED ORDER — ASPIRIN 81 MG PO TABS: 81.0000 mg | ORAL_TABLET | Freq: Every day | ORAL | Status: DC

## 2014-06-17 MED ORDER — ATORVASTATIN CALCIUM 80 MG PO TABS: 80.0000 mg | ORAL_TABLET | Freq: Every day | ORAL | Status: DC

## 2014-06-17 NOTE — Progress Notes (Signed)
Patient ID: [facility], female   DOB: [birth year], 59 y.o.   MRN: [id number]    Subjective:    No complaints this morning.   Objective:   Temp:  [97.8 F (36.6 C)-98.4 F (36.9 C)] 97.9 F (36.6 C) (03/19 0746) Pulse Rate:  [50-72] 61 (03/19 0746) Resp:  [9-23] 19 (03/19 0746) BP: (98-143)/(58-83) 128/69 mmHg (03/19 0746) SpO2:  [92 %-99 %] 96 % (03/19 0746) Weight:  [171 lb 8.3 oz (77.8 kg)] 171 lb 8.3 oz (77.8 kg) (03/19 0445) Last BM Date: 06/15/14  Filed Weights   06/15/14 0500 06/16/14 0500 06/17/14 0445  Weight: 170 lb (77.111 kg) 174 lb 9.6 oz (79.198 kg) 171 lb 8.3 oz (77.8 kg)    Intake/Output Summary (Last 24 hours) at 06/17/14 0826 Last data filed at 06/17/14 0754  Gross per 24 hour  Intake  913.2 ml  Output   1650 ml  Net -736.8 ml    Telemetry:NSR  Exam:  General: NAD  Resp: CTAB  Cardiac: RRR, no m/r/g, no JVD  GI: abdomen soft, NT, ND  MSK:no LE edema  Neuro: no focal deficits   Lab Results:  Basic Metabolic Panel:  Recent Labs Lab 06/15/14 2015 06/16/14 0550 06/17/14 0331  NA 138 142 140  K 3.4* 3.7 4.1  CL 105 111 111  CO2 28 26 22   GLUCOSE 117* 93 118*  BUN 14 11 11   CREATININE 0.85 0.78 0.79  CALCIUM 8.9 8.6 8.5    Liver Function Tests:  Recent Labs Lab 06/14/14 1343  AST 20  ALT 18  ALKPHOS 80  BILITOT 1.1  PROT 6.2  ALBUMIN 3.3*    CBC:  Recent Labs Lab 06/14/14 1343 06/14/14 2233 06/17/14 0331  WBC 16.2* 13.8* 10.1  HGB 14.6 14.1 13.2  HCT 43.3 42.4 39.9  MCV 94.3 93.8 94.1  PLT 295 295 272    Cardiac Enzymes:  Recent Labs Lab 06/14/14 1633 06/14/14 2233 06/15/14 0417  TROPONINI <0.03 <0.03 <0.03    BNP: No results for input(s): PROBNP in the last 8760 hours.  Coagulation:  Recent Labs Lab 06/15/14 2015  INR 0.98    ECG:   Medications:   Scheduled Medications: . aspirin  81 mg Oral Daily  . atenolol  50 mg Oral Daily  . atorvastatin  20 mg Oral Daily  . clopidogrel  75 mg  Oral Q breakfast  . venlafaxine XR  75 mg Oral Daily     Infusions:     PRN Medications:  acetaminophen, acetaminophen, nitroGLYCERIN, ondansetron (ZOFRAN) IV     Assessment/Plan    1. Chest pain - abnormal cardiac CTA, referred for cath - cath 06/16/14 with LM patent, LAD prox 50-60%, LAD 40% mid, D1 50%, LCX 20-30% prox and 50% mid and 60% distal, 90% lesion prior to second PL.RCA moderate diffuse disease. LVgram EF 55%. S/p DES to distal LCX lesion.  - she is on ASA 81, atenolol 50, plavix 75, atorva 20mg  daily. Change atorva to 80mg  daily in setting of CAD. Resume home lisinopril/HCTZ combination pill at discharge - post cath Hgb 13.2, Cr 0.79. EKG sinus brady diffuse TWI unchanged   2. Pulmonary nodules - incidental finding on cardiac CT, needs repeat CT in 3 months   Will need f/u in 2-3 weeks at [facility] [address] [facility].   Dina RichJonathan Ilean Spradlin, M.D.

## 2014-06-17 NOTE — Discharge Summary (Signed)
Discharge Summary   Patient ID: [facility],  MRN: [id number], DOB/AGE: [birth year] 59 y.o.  Admit date: 06/14/2014 Discharge date: 06/17/2014  Primary Care Provider: No primary care provider on file. Primary Cardiologist: [facility], MD   Discharge Diagnoses Principal Problem:   Unstable angina  **S/P PCI and DES of the distal LCX this admission.  Active Problems:   CAD in native artery   Tobacco abuse   Hypertension   Left Pulmonary nodules  **Incidental finding on cardiac CTA with recommendation for f/u noncontrast chest CT in 3 months.  Allergies No Known Allergies  Procedures  Cardiac CT Angiography 3.17.2016  IMPRESSION: 1. Coronary calcium score of 704 (LAD 281, LCX 238, RCA 15). This was 68 percentile for age and sex matched control. 2.  Normal origin of coronary arteries.  Left dominance. 3. Severe diffuse calcifications of all three coronary arteries with moderate plaques and 50-69% stenosis in all three coronary vessels. Suspicion for obstructive CAD in 2.diagonal branch.   Cardiac catheterization is recommended.  Radiology over-read  IMPRESSION: 1. Several pulmonary nodules in the left lung, as detailed above. While it is possible that these could be infectious/inflammatory, or related to areas of mucoid impaction within distal bronchioles, the possibility of malignancy should be considered. At this time, a repeat noncontrast chest CT is recommended in 3 months to assess for the stability or resolution of these findings. _____________   Cardiac Catheterization and Percutaneous Coronary Intervention 3.18.2016  Findings:  Ao Pressure: 106/59 (79) LV Pressure: 105/11/21 There was no signficant gradient across the aortic valve on pullback.  Left main: Mild calcification. No significant stenosis.  LAD: Long vessel wraps apex. 50-60% lesion in proximal to mid LAD prior to take-off of first diagonal. There is a 40% lesion in the mid LAD just after the D1  take-off. Mild plaque in distal LAD. In large D1 there is 50% lesion ostially and 50% lesion in midsection. LCX:  Small ramus with moderate ostial plaque. Large dominant circumflex. Gives off large branching OM-1. 3 PLS and a PDA. In the proximal LCX there is a 20-30% stenosis. In the mid LCX there is a 50% lesion just after the take-off of the OM-1.  In the distal LCX there is a 60% lesion after the first PL and a 90% lesion prior to the 2nd PL. In the large OM-1 there is a 40-50% mid lesion    **The distal LCX was successfully stented using a 2.25 x 24 mm Promus DES.**  RCA: Moderate sized non-dominant vessel with moderate diffuse plaque.  LV-gram done in the RAO projection: Ejection fraction =  55% nor regional wall motion abnormalities _____________   History of Present Illness  59 y/o female with a h/o HTN, HL, and FH of premature CAD, who presented to [facility] on 06/14/2014 after an episode of substernal chest pain that occurred at work.  In the ED, ECG was notable for diffuse T wave abnormalities and troponin was normal.  She was admitted for further evaluation.  Hospital Course  Patient ruled out for MI.  Decision was made to pursue Cardiac CTA to r/o obstructive disease.  This was performed on 3/17 and showed moderate three vessel CAD with a coronary calcium score of 704 (99th percentile).  The radiology over-read noted left lung nodules with a recommendation for f/u non-contrast chest CT in 3 months.  Decision was then made to pursue diagnostic catheterization.  This was performed on 3/18 and she was found to have moderate, diffuse  CAD with severe distal LCX disease and normal LV function.  The distal LCX was then successfully intervened upon with placement of a 2.25 x 24 mm Promus DES.  [facility] tolerated the procedure well and post-procedure, she has been ambulating without recurrent symptoms or limitations. She will be discharged home today in good condition.  Discharge  Vitals Blood pressure 128/69, pulse 61, temperature 97.9 F (36.6 C), temperature source Oral, resp. rate 21, height 5\' 2"  (1.575 m), weight 171 lb 8.3 oz (77.8 kg), SpO2 96 %.  Filed Weights   06/15/14 0500 06/16/14 0500 06/17/14 0445  Weight: 170 lb (77.111 kg) 174 lb 9.6 oz (79.198 kg) 171 lb 8.3 oz (77.8 kg)   Labs  CBC  Recent Labs  06/14/14 2233 06/17/14 0331  WBC 13.8* 10.1  HGB 14.1 13.2  HCT 42.4 39.9  MCV 93.8 94.1  PLT 295 272   Basic Metabolic Panel  Recent Labs  06/16/14 0550 06/17/14 0331  NA 142 140  K 3.7 4.1  CL 111 111  CO2 26 22  GLUCOSE 93 118*  BUN 11 11  CREATININE 0.78 0.79  CALCIUM 8.6 8.5   Liver Function Tests  Recent Labs  06/14/14 1343  AST 20  ALT 18  ALKPHOS 80  BILITOT 1.1  PROT 6.2  ALBUMIN 3.3*   Cardiac Enzymes  Recent Labs  06/14/14 1633 06/14/14 2233 06/15/14 0417  TROPONINI <0.03 <0.03 <0.03   Fasting Lipid Panel  Recent Labs  06/16/14 0550  CHOL 136  HDL 34*  LDLCALC 76  TRIG 161129  CHOLHDL 4.0   Disposition  Pt is being discharged home today in good condition.  Follow-up Plans & Appointments  Follow-up Information    Follow up with [facility]. Schedule an appointment as soon as possible for a visit in 2 weeks.   Why:  we will arrange and contact you.   Contact information:   [address] [place] [id number] [phone]     Discharge Medications    Medication List    TAKE these medications        aspirin 81 MG tablet  Take 1 tablet (81 mg total) by mouth daily.     atenolol 50 MG tablet  Commonly known as:  TENORMIN  Take 50 mg by mouth daily.     atorvastatin 80 MG tablet  Commonly known as:  LIPITOR  Take 1 tablet (80 mg total) by mouth daily.     clopidogrel 75 MG tablet  Commonly known as:  PLAVIX  Take 1 tablet (75 mg total) by mouth daily with breakfast.     lisinopril-hydrochlorothiazide  20-12.5 MG per tablet  Commonly known as:  PRINZIDE,ZESTORETIC  Take 2 tablets by mouth daily.     nitroGLYCERIN 0.4 MG SL tablet  Commonly known as:  NITROSTAT  Place 1 tablet (0.4 mg total) under the tongue every 5 (five) minutes x 3 doses as needed for chest pain.     venlafaxine XR 75 MG 24 hr capsule  Commonly known as:  EFFEXOR-XR  Take 75 mg by mouth daily.       Outstanding Labs/Studies  Follow-up lipids/lft's in 6-8 wks (statin dose increased) Follow-up non-contrast chest CT in 3 months (L lung nodules incidentally noted on CT this admission).  Duration of Discharge Encounter   Greater than 30 minutes including physician time.  Signed, Nicolasa Duckinghristopher Berge NP 06/17/2014, 9:39 AM

## 2014-06-17 NOTE — Discharge Instructions (Signed)
**PLEASE REMEMBER TO BRING ALL OF YOUR MEDICATIONS TO EACH OF YOUR FOLLOW-UP OFFICE VISITS.  NO HEAVY LIFTING OR SEXUAL ACTIVITY X 7 DAYS. NO DRIVING X 3-5 DAYS. NO SOAKING BATHS, HOT TUBS, POOLS, ETC., X 7 DAYS.  Groin Site Care Refer to this sheet in the next few weeks. These instructions provide you with information on caring for yourself after your procedure. Your caregiver may also give you more specific instructions. Your treatment has been planned according to current medical practices, but problems sometimes occur. Call your caregiver if you have any problems or questions after your procedure. HOME CARE INSTRUCTIONS  You may shower 24 hours after the procedure. Remove the bandage (dressing) and gently wash the site with plain soap and water. Gently pat the site dry.   Do not apply powder or lotion to the site.   Do not sit in a bathtub, swimming pool, or whirlpool for 5 to 7 days.   No bending, squatting, or lifting anything over 10 pounds (4.5 kg) as directed by your caregiver.   Inspect the site at least twice daily.   Do not drive home if you are discharged the same day of the procedure. Have someone else drive you.   What to expect:  Any bruising will usually fade within 1 to 2 weeks.   Blood that collects in the tissue (hematoma) may be painful to the touch. It should usually decrease in size and tenderness within 1 to 2 weeks.  SEEK IMMEDIATE MEDICAL CARE IF:  You have unusual pain at the groin site or down the affected leg.   You have redness, warmth, swelling, or pain at the groin site.   You have drainage (other than a small amount of blood on the dressing).   You have chills.   You have a fever or persistent symptoms for more than 72 hours.   You have a fever and your symptoms suddenly get worse.   Your leg becomes pale, cool, tingly, or numb.  You have heavy bleeding from the site. Hold pressure on the site. .   Start taking an aspirin daily.  You are  welcome to come back at any time for further evaluation of your chest pain.    Chest Pain (Nonspecific) It is often hard to give a specific diagnosis for the cause of chest pain. There is always a chance that your pain could be related to something serious, such as a heart attack or a blood clot in the lungs. You need to follow up with your health care provider for further evaluation. CAUSES   Heartburn.  Pneumonia or bronchitis.  Anxiety or stress.  Inflammation around your heart (pericarditis) or lung (pleuritis or pleurisy).  A blood clot in the lung.  A collapsed lung (pneumothorax). It can develop suddenly on its own (spontaneous pneumothorax) or from trauma to the chest.  Shingles infection (herpes zoster virus). The chest wall is composed of bones, muscles, and cartilage. Any of these can be the source of the pain.  The bones can be bruised by injury.  The muscles or cartilage can be strained by coughing or overwork.  The cartilage can be affected by inflammation and become sore (costochondritis). DIAGNOSIS  Lab tests or other studies may be needed to find the cause of your pain. Your health care provider may have you take a test called an ambulatory electrocardiogram (ECG). An ECG records your heartbeat patterns over a 24-hour period. You may also have other tests, such as:  Transthoracic echocardiogram (TTE). During echocardiography, sound waves are used to evaluate how blood flows through your heart.  Transesophageal echocardiogram (TEE).  Cardiac monitoring. This allows your health care provider to monitor your heart rate and rhythm in real time.  Holter monitor. This is a portable device that records your heartbeat and can help diagnose heart arrhythmias. It allows your health care provider to track your heart activity for several days, if needed.  Stress tests by exercise or by giving medicine that makes the heart beat faster. TREATMENT   Treatment depends on what  may be causing your chest pain. Treatment may include:  Acid blockers for heartburn.  Anti-inflammatory medicine.  Pain medicine for inflammatory conditions.  Antibiotics if an infection is present.  You may be advised to change lifestyle habits. This includes stopping smoking and avoiding alcohol, caffeine, and chocolate.  You may be advised to keep your head raised (elevated) when sleeping. This reduces the chance of acid going backward from your stomach into your esophagus. Most of the time, nonspecific chest pain will improve within 2-3 days with rest and mild pain medicine.  HOME CARE INSTRUCTIONS   If antibiotics were prescribed, take them as directed. Finish them even if you start to feel better.  For the next few days, avoid physical activities that bring on chest pain. Continue physical activities as directed.  Do not use any tobacco products, including cigarettes, chewing tobacco, or electronic cigarettes.  Avoid drinking alcohol.  Only take medicine as directed by your health care provider.  Follow your health care provider's suggestions for further testing if your chest pain does not go away.  Keep any follow-up appointments you made. If you do not go to an appointment, you could develop lasting (chronic) problems with pain. If there is any problem keeping an appointment, call to reschedule. SEEK MEDICAL CARE IF:   Your chest pain does not go away, even after treatment.  You have a rash with blisters on your chest.  You have a fever. SEEK IMMEDIATE MEDICAL CARE IF:   You have increased chest pain or pain that spreads to your arm, neck, jaw, back, or abdomen.  You have shortness of breath.  You have an increasing cough, or you cough up blood.  You have severe back or abdominal pain.  You feel nauseous or vomit.  You have severe weakness.  You faint.  You have chills. This is an emergency. Do not wait to see if the pain will go away. Get medical help at  once. Call your local emergency services (911 in [place] Do not drive yourself to the hospital. MAKE SURE YOU:   Understand these instructions.  Will watch your condition.  Will get help right away if you are not doing well or get worse. Document Released: 12/25/2004 Document Revised: 03/22/2013 Document Reviewed: 10/21/2007 [facility] Patient Information 2015 [employer] [place]. This information is not intended to replace advice given to you by your health care provider. Make sure you discuss any questions you have with your health care provider.

## 2014-06-17 NOTE — Progress Notes (Signed)
CARDIAC REHAB PHASE I   PRE:  Rate/Rhythm: 66 SR  BP:  Supine:  Sitting:120/70 Standing:    SaO2: 94 RA  MODE:  Ambulation: 500 ft   POST:  Rate/Rhythm: 77 SR  BP:  Supine:   Sitting: 124/70  Standing:    SaO2: 95 RA Tolerated ambulation  Well without angina or difficulty.  Education completed re activity restrictions, site care, stent card, antiplatelet therapy, angina symptoms, NTG usage, when to call 911 and the doctor, home exercise, referred to phase II cardiac rehab. [phone] Cindra EvesBehrens, [place]esti Seaberg Adele RN, BSN 06/17/2014 8:54 AM

## 2014-06-17 NOTE — Progress Notes (Signed)
TR BAND REMOVAL  LOCATION:    right radial  DEFLATED PER PROTOCOL:    Yes.    TIME BAND OFF / DRESSING APPLIED:    21:30   SITE UPON ARRIVAL:    Level 0  SITE AFTER BAND REMOVAL:    Level 0  REVERSE ALLEN'S TEST:     positive  CIRCULATION SENSATION AND MOVEMENT:    Within Normal Limits   Yes.    COMMENTS:   Pt tolerated removal of TR band without complications.  Will continue to monitor patient.

## 2014-06-19 MED FILL — Sodium Chloride IV Soln 0.9%: INTRAVENOUS | Qty: 50 | Status: AC

## 2014-07-12 ENCOUNTER — Other Ambulatory Visit: Payer: Self-pay | Admitting: Acute Care

## 2014-07-12 ENCOUNTER — Encounter: Payer: Self-pay | Admitting: Nurse Practitioner

## 2014-07-12 ENCOUNTER — Ambulatory Visit (INDEPENDENT_AMBULATORY_CARE_PROVIDER_SITE_OTHER): Payer: PRIVATE HEALTH INSURANCE | Admitting: Nurse Practitioner

## 2014-07-12 VITALS — BP 104/70 | HR 72 | Ht 61.5 in | Wt 168.4 lb

## 2014-07-12 DIAGNOSIS — I200 Unstable angina: Secondary | ICD-10-CM | POA: Diagnosis not present

## 2014-07-12 DIAGNOSIS — I25810 Atherosclerosis of coronary artery bypass graft(s) without angina pectoris: Secondary | ICD-10-CM

## 2014-07-12 DIAGNOSIS — R918 Other nonspecific abnormal finding of lung field: Secondary | ICD-10-CM

## 2014-07-12 DIAGNOSIS — Z720 Tobacco use: Secondary | ICD-10-CM | POA: Diagnosis not present

## 2014-07-12 DIAGNOSIS — E785 Hyperlipidemia, unspecified: Secondary | ICD-10-CM | POA: Diagnosis not present

## 2014-07-12 DIAGNOSIS — R911 Solitary pulmonary nodule: Secondary | ICD-10-CM | POA: Diagnosis not present

## 2014-07-12 NOTE — Patient Instructions (Signed)
Medication Instructions:  None  Labwork: Your physician recommends that you return for a FASTING lipid profile: May 27 between 8-5    Testing/Procedures: Lung Cancer Screening  Follow-Up: In June with Norma FredricksonLori Gerhardt, NP  Any Other Special Instructions Will Be Listed Below (If Applicable).

## 2014-07-12 NOTE — Progress Notes (Signed)
Patient Name: Kari Harrison Date of Encounter: 07/12/2014  Primary Care Provider:  Neldon Labella, MD Primary Cardiologist:  Golden Circle, MD   Chief Complaint   59 year old female status post recent admission for chest pain with subsequent stenting of the left circumflex, who presents for follow-up today.  Past Medical History   Past Medical History  Diagnosis Date  . Hypertension   . CAD (coronary artery disease)     a. Cardiac CTA: severe 3VD;  b. 05/2014 Cath/PCI: LM nl, LAD 50-60p/m, 64m, D1 50ost/m, RI small/plaque, LCX dominant 20-30p, 57m, 60/90d (2.25x24 Promus DES), OM2 40-4m, RCA nondom, mod diff plaque, EF 55%.  . Tobacco abuse   . Pulmonary nodules     a. 05/2014 CTA: left lung pulm nodules w/ rec for 3 month f/u noncontrast CT.  Marland Kitchen Hyperlipidemia    Past Surgical History  Procedure Laterality Date  . Cesarean section    . Left heart catheterization with coronary angiogram N/A 06/16/2014    Procedure: LEFT HEART CATHETERIZATION WITH CORONARY ANGIOGRAM;  Surgeon: Dolores Patty, MD;  Location: [facility] CATH LAB;  Service: Cardiovascular;  Laterality: N/A;    Allergies  No Known Allergies  HPI  59 year old female who was recently admitted to [facility] with chest pain. She initially underwent cardiac CT angiography which suggested severe three-vessel CAD with incidental finding of left lung nodules and recommendation for follow-up CT in 3 months. Diagnostic catheterization was performed and showed severe left circumflex disease. This was successfully treated using a Promus drug eluting stent. She had normal LV function. She tolerated the procedure well was discharged the next day. Since discharge, she has done well without recurrence of chest pain or dyspnea. She further denies PND, without knee, dizziness, CD20, edema, or early satiety. She is tolerating medications well. She continues to smoke about 1 pack of cigarettes per week and is committed to quitting. Home  Medications  Prior to Admission medications   Medication Sig Start Date End Date Taking? Authorizing Provider  aspirin 81 MG tablet Take 1 tablet (81 mg total) by mouth daily. 06/17/14  Yes Ok Anis, NP  atenolol (TENORMIN) 50 MG tablet Take 50 mg by mouth daily. 06/12/14  Yes Historical Provider, MD  atorvastatin (LIPITOR) 80 MG tablet Take 1 tablet (80 mg total) by mouth daily. 06/17/14  Yes Ok Anis, NP  clopidogrel (PLAVIX) 75 MG tablet Take 1 tablet (75 mg total) by mouth daily with breakfast. 06/17/14  Yes Ok Anis, NP  lisinopril-hydrochlorothiazide (PRINZIDE,ZESTORETIC) 20-12.5 MG per tablet Take 2 tablets by mouth daily. 06/12/14  Yes Historical Provider, MD  nitroGLYCERIN (NITROSTAT) 0.4 MG SL tablet Place 1 tablet (0.4 mg total) under the tongue every 5 (five) minutes x 3 doses as needed for chest pain. 06/17/14  Yes Ok Anis, NP  venlafaxine XR (EFFEXOR-XR) 75 MG 24 hr capsule Take 75 mg by mouth daily. 06/12/14  Yes Historical Provider, MD    Review of Systems As above, she is doing well without chest pain or dyspnea. She continues to smoke..  All other systems reviewed and are otherwise negative except as noted above.  Physical Exam  VS:  BP 104/70 mmHg  Pulse 72  Ht 5' 1.5" (1.562 m)  Wt 168 lb 6.4 oz (76.386 kg)  BMI 31.31 kg/m2 , BMI Body mass index is 31.31 kg/(m^2). GEN: Well nourished, well developed, in no acute distress. HEENT: normal. Neck: Supple, no JVD, carotid bruits, or masses. Cardiac: RRR, no murmurs, rubs,  or gallops. No clubbing, cyanosis, edema.  Radials/DP/PT 2+ and equal bilaterally.  Right wrist and right groin cath sites are without bleeding, bruit, or hematoma. Respiratory:  Respirations regular and unlabored, clear to auscultation bilaterally. GI: Soft, nontender, nondistended, BS + x 4. MS: no deformity or atrophy. Skin: warm and dry, no rash. Neuro:  Strength and sensation are intact. Psych: Normal  affect.  Accessory Clinical Findings  ECG - regular sinus rhythm, 68, left axis, lateral T-wave inversion which is unchanged from prior ECGs. No acute ST or T changes.  Assessment & Plan  1.  Coronary artery disease: Status post recent admission for chest pain with abnormal cardiac CTA and subsequent catheterization revealing severe circumflex disease. This was successfully stented. She has residual, diffuse moderate coronary artery disease. She has not been having any chest pain or dyspnea. She remains on aspirin, beta blocker, statin, Plavix, and ACE inhibitor therapy. She is tolerating all of her medications well. She does not plan on partaking in cardiac rehabilitation though this was encouraged.  2. Hypertension: Stable on the beta blocker, ACE inhibitor, and diuretic.  3. Hyperlipidemia: LDL was 76 on March 18, with normal LFTs.  4. Ongoing tobacco abuse: She is down from about a half a pack a day to 1 pack per week. Complete cessation advised.  5. Left lung nodules: I discussed her case with Kandice RobinsonsSarah Groce, NP, who is the coordinator for the [facility]. We will send an ambulatory referral to the nodule clinic and they will follow up with her and arrange for follow-up low-dose lung cancer screening CT within the next 3 months.  6. Disposition: Follow-up lipids and LFTs in approximately 6 weeks. Follow with Dr. Shirlee LatchMcLean in 3 months. Referral made for nodule screening.   Nicolasa Duckinghristopher Berge, NP 07/12/2014, 10:46 AM

## 2014-08-09 ENCOUNTER — Encounter: Payer: Self-pay | Admitting: *Deleted

## 2014-08-25 ENCOUNTER — Other Ambulatory Visit (INDEPENDENT_AMBULATORY_CARE_PROVIDER_SITE_OTHER): Payer: 59 | Admitting: *Deleted

## 2014-08-25 DIAGNOSIS — R911 Solitary pulmonary nodule: Secondary | ICD-10-CM | POA: Diagnosis not present

## 2014-08-25 DIAGNOSIS — E785 Hyperlipidemia, unspecified: Secondary | ICD-10-CM | POA: Diagnosis not present

## 2014-08-25 LAB — HEPATIC FUNCTION PANEL
ALBUMIN: 3.9 g/dL (ref 3.5–5.2)
ALT: 46 U/L — ABNORMAL HIGH (ref 0–35)
AST: 36 U/L (ref 0–37)
Alkaline Phosphatase: 96 U/L (ref 39–117)
BILIRUBIN TOTAL: 0.6 mg/dL (ref 0.2–1.2)
Bilirubin, Direct: 0.2 mg/dL (ref 0.0–0.3)
TOTAL PROTEIN: 7.2 g/dL (ref 6.0–8.3)

## 2014-08-25 LAB — LIPID PANEL
CHOL/HDL RATIO: 3
Cholesterol: 119 mg/dL (ref 0–200)
HDL: 38.1 mg/dL — ABNORMAL LOW (ref >39.00)
LDL CALC: 56 mg/dL (ref 0–99)
NonHDL: 80.9
TRIGLYCERIDES: 126 mg/dL (ref 0.0–149.0)
VLDL: 25.2 mg/dL (ref 0.0–40.0)

## 2014-08-29 ENCOUNTER — Other Ambulatory Visit: Payer: Self-pay | Admitting: *Deleted

## 2014-08-29 DIAGNOSIS — R7401 Elevation of levels of liver transaminase levels: Secondary | ICD-10-CM

## 2014-08-29 DIAGNOSIS — R740 Nonspecific elevation of levels of transaminase and lactic acid dehydrogenase [LDH]: Principal | ICD-10-CM

## 2014-09-05 ENCOUNTER — Other Ambulatory Visit: Payer: Self-pay | Admitting: Family Medicine

## 2014-09-05 DIAGNOSIS — R918 Other nonspecific abnormal finding of lung field: Secondary | ICD-10-CM

## 2014-09-14 ENCOUNTER — Encounter: Payer: Self-pay | Admitting: Emergency Medicine

## 2014-09-14 ENCOUNTER — Ambulatory Visit (INDEPENDENT_AMBULATORY_CARE_PROVIDER_SITE_OTHER): Payer: 59 | Admitting: Emergency Medicine

## 2014-09-14 VITALS — BP 120/80 | HR 66 | Ht 62.0 in | Wt 168.0 lb

## 2014-09-14 DIAGNOSIS — R918 Other nonspecific abnormal finding of lung field: Secondary | ICD-10-CM

## 2014-09-14 DIAGNOSIS — R911 Solitary pulmonary nodule: Secondary | ICD-10-CM | POA: Diagnosis not present

## 2014-09-14 DIAGNOSIS — Z720 Tobacco use: Secondary | ICD-10-CM

## 2014-09-14 NOTE — Assessment & Plan Note (Signed)
Left lower lobe pulmonary nodules the largest of which is approximate 1 cm in diameter. Etiology is unclear but she does have a smoking history and a believe that this needs to be followed. She needs a repeat CT scan with contrast now which would be the three-month time point. I will follow with her to review

## 2014-09-14 NOTE — Assessment & Plan Note (Signed)
She has made significant progress decreasing her cigarettes. She is motivated to stop completely. I encouraged her in this. We will consider medical therapy when we follow-up. She likely needs pulmonary function testing at some point and we will discuss this as well

## 2014-09-14 NOTE — Patient Instructions (Signed)
We will perform a CT scan of your chest with contrast to reassess your small pulmonary nodule Please follow-up with Dr. Delton Coombes next available to review the study

## 2014-09-14 NOTE — Progress Notes (Signed)
Subjective:    Patient ID: Kari Harrison, female    DOB: 12-19-1955, 59 y.o.   MRN: [id number]  HPI 59 year old active smoker (10 pack-years) with hypertension, CAD recently identified and s/p L heart cath and stent placement. She is very active. Denies any wheezing. She does have some occasional cough especially in the last week which she ascribes to allergies. She does not have any history of lung cancer or any other malignancy.    I have reviewed her cardiac CT scan from March 2016 that reveals left lower lobe nodules, the largest of which is approximately 1 cm in diameter. She is referred regarding the pulmonary nodules. I have reviewed her cardiology notes including her catheterization report.    Review of Systems  Constitutional: Negative for fever and unexpected weight change.  HENT: Negative for congestion, dental problem, ear pain, nosebleeds, postnasal drip, rhinorrhea, sinus pressure, sneezing, sore throat and trouble swallowing.   Eyes: Negative for redness and itching.  Respiratory: Negative for cough, chest tightness, shortness of breath and wheezing.   Cardiovascular: Negative for palpitations and leg swelling.  Gastrointestinal: Negative for nausea and vomiting.       Heartburn  Genitourinary: Negative for dysuria.  Musculoskeletal: Negative for joint swelling.  Skin: Negative for rash.  Neurological: Negative for headaches.  Hematological: Does not bruise/bleed easily.  Psychiatric/Behavioral: Negative for dysphoric mood. The patient is not nervous/anxious.    Past Medical History  Diagnosis Date  . Hypertension   . CAD (coronary artery disease)     a. Cardiac CTA: severe 3VD;  b. 05/2014 Cath/PCI: LM nl, LAD 50-60p/m, 72m, D1 50ost/m, RI small/plaque, LCX dominant 20-30p, 49m, 60/90d (2.25x24 Promus DES), OM2 40-71m, RCA nondom, mod diff plaque, EF 55%.  . Tobacco abuse   . Pulmonary nodules     a. 05/2014 CTA: left lung pulm nodules w/ rec for 3 month f/u noncontrast  CT.  Marland Kitchen Hyperlipidemia      Family History  Problem Relation Age of Onset  . Arthritis Mother   . Macular degeneration Mother   . Heart attack Father   . Hypertension Sister      History   Social History  . Marital Status: Married    Spouse Name: N/A  . Number of Children: N/A  . Years of Education: N/A   Occupational History  . Not on file.   Social History Main Topics  . Smoking status: Current Every Day Smoker -- 0.25 packs/day for 19 years    Types: Cigarettes  . Smokeless tobacco: Not on file  . Alcohol Use: No  . Drug Use: Not on file  . Sexual Activity: Not on file   Other Topics Concern  . 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   No Known Allergies   Outpatient Prescriptions Prior to Visit  Medication Sig Dispense Refill  . aspirin 81 MG tablet Take 1 tablet (81 mg total) by mouth daily. 30 tablet   . atenolol (TENORMIN) 50 MG tablet Take 50 mg by mouth daily.  0  . atorvastatin (LIPITOR) 80 MG tablet Take 1 tablet (80 mg total) by mouth daily. 30 tablet 6  . clopidogrel (PLAVIX) 75 MG tablet Take 1 tablet (75 mg total) by mouth daily with breakfast. 30 tablet 6  . lisinopril-hydrochlorothiazide (PRINZIDE,ZESTORETIC) 20-12.5 MG per tablet Take 2 tablets by mouth daily.  0  . nitroGLYCERIN (NITROSTAT) 0.4 MG SL tablet Place 1 tablet (0.4 mg total)  under the tongue every 5 (five) minutes x 3 doses as needed for chest pain. 25 tablet 3  . venlafaxine XR (EFFEXOR-XR) 75 MG 24 hr capsule Take 75 mg by mouth daily.  0   No facility-administered medications prior to visit.         Objective:   Physical Exam Filed Vitals:   09/14/14 1447 09/14/14 1448  BP:  120/80  Pulse:  66  Height: 5\' 2"  (1.575 m)   Weight: 168 lb (76.204 kg)   SpO2:  96%   Gen: Pleasant, well-nourished, in no distress,  normal affect  ENT: No lesions,  mouth clear,  oropharynx clear, no postnasal drip  Neck: No JVD, no TMG, no carotid  bruits  Lungs: No use of accessory muscles, clear without rales or rhonchi  Cardiovascular: RRR, heart sounds normal, no murmur or gallops, no peripheral edema  Musculoskeletal: No deformities, no cyanosis or clubbing  Neuro: alert, non focal  Skin: Warm, no lesions or rashes   06/15/14 -- cardiac CT scan  FINDINGS: A 120 kV prospective scan was triggered in the descending thoracic aorta at 111 HU's. Axial non-contrast 3 mm slices were carried out through the heart. The data set was analyzed on a dedicated work station and scored using the Agatson method. Gantry rotation speed was 270 msecs and collimation was .9 mm. No beta blockade and 0.4 mg of sl NTG was given. The 3D data set was reconstructed in 5% intervals of the 67-82 % of the R-R cycle. Diastolic phases were analyzed on a dedicated work station using MPR, MIP and VRT modes. The patient received 80 cc of contrast.  Aorta: Normal caliber of the aortic root and visualized portion of the ascending aorta. No calcifications.  Aortic Valve: Trileaflet. No calcifications.  Coronary Arteries: Originating in normal position.  Left dominance.  Left main is a large caliber vessel with no plaque. It gives rise to LAD, a very small ramus intermedius and a dominant LCX artery.  LAD is a large caliber vessel that gives rise to one very small diagonal branch and a large second diagonal branch. Proximal LAD has mild long calcified plaque with associated 0-25 % stenosis. In the mid LAD, prior to the take off of the second diagonal branch there is a moderate mixed plaque with associated stenosis 50-69%. Distal LAD has no plaque.  The second diagonal branch has mild calcified ostial plaque with 25-50% stenosis. Mid portion of the vessel has non-calcified plaque suspicious for > 70% stenosis.  Ramus intermedius is a very small caliber vessel that has > 50% stenosis in the mid segment.  LCX artery is a large dominant  vessel that gives rise to three obtuse marginal branches and PDA.  Ostial and proximal LCX artery has 3 focal minimal calcified plaques associated with 0-25% stenosis. Mid LCX artery at the take off of OM1 has a long moderate calcified plaque associated with 50-69% stenosis. There is another long mild calcified plaque at the take off the OM2 associated with 25-50% stenosis.  OM1 is a large caliber vessel that has minimal calcified plaque in the ostial and proximal portion with 0-25% stenosis.  OM2 and OM3 are small caliber vessel with mild plaque only.  RCA is a moderate caliber non-dominant vessel. Proximal RCA has 1 focal calcified plaque with associated stenosis 25-50% stenosis. Mid RCA had a long non-calcified plaque associated with 50-69% stenosis.  IMPRESSION: 1. Coronary calcium score of 704 (LAD 281, LCX 238, RCA 15). This was 99  percentile for age and sex matched control.  2. Normal origin of coronary arteries. Left dominance.  3. Severe diffuse calcifications of all three coronary arteries with moderate plaques and 50-69% stenosis in all three coronary vessels. Suspicion for obstructive CAD in 2.diagonal branch.  Cardiac catheterization is recommended.  FINDINGS: 11 x 10 mm irregular-shaped slightly spiculated nodular density in the left lower lobe (image 39 of series 204), which has a slightly branching pattern, which could represent mucoid impaction within some distal bronchi and bronchioles. Adjacent satellite nodules in the left lower lobe (example on image 42 of series 204 where there is a 4 mm nodule). In addition, there is a 9 x 6 mm nodule in the left upper lobe (images 18 and 19 of series 204). Within the visualized portions of the thorax there is no acute consolidative airspace disease, no pleural effusion, no pneumothorax and no lymphadenopathy. Visualized portions of the upper abdomen are unremarkable. No aggressive appearing lytic or blastic  lesions are noted in the visualized portions of the skeleton.  IMPRESSION: 1. Several pulmonary nodules in the left lung, as detailed above. While it is possible that these could be infectious/inflammatory, or related to areas of mucoid impaction within distal bronchioles, the possibility of malignancy should be considered. At this time, a repeat noncontrast chest CT is recommended in 3 months to assess for the stability or resolution of these findings.m      Assessment & Plan:  Pulmonary nodules Left lower lobe pulmonary nodules the largest of which is approximate 1 cm in diameter. Etiology is unclear but she does have a smoking history and a believe that this needs to be followed. She needs a repeat CT scan with contrast now which would be the three-month time point. I will follow with her to review  Tobacco abuse She has made significant progress decreasing her cigarettes. She is motivated to stop completely. I encouraged her in this. We will consider medical therapy when we follow-up. She likely needs pulmonary function testing at some point and we will discuss this as well

## 2014-09-21 ENCOUNTER — Ambulatory Visit: Payer: PRIVATE HEALTH INSURANCE | Admitting: Nurse Practitioner

## 2014-09-22 ENCOUNTER — Ambulatory Visit (INDEPENDENT_AMBULATORY_CARE_PROVIDER_SITE_OTHER)
Admission: RE | Admit: 2014-09-22 | Discharge: 2014-09-22 | Disposition: A | Discharge status: Home / Self Care | Payer: 59 | Source: Ambulatory Visit | Attending: Emergency Medicine | Admitting: Emergency Medicine

## 2014-09-22 DIAGNOSIS — R911 Solitary pulmonary nodule: Secondary | ICD-10-CM | POA: Diagnosis not present

## 2014-09-22 MED ORDER — IOHEXOL 300 MG/ML  SOLN
80.0000 mL | Freq: Once | INTRAMUSCULAR | Status: AC | PRN
  Administered 2014-09-22: 80 mL via INTRAVENOUS

## 2014-09-27 ENCOUNTER — Encounter: Payer: Self-pay | Admitting: Nurse Practitioner

## 2014-09-27 ENCOUNTER — Ambulatory Visit (INDEPENDENT_AMBULATORY_CARE_PROVIDER_SITE_OTHER): Payer: 59 | Admitting: Nurse Practitioner

## 2014-09-27 VITALS — BP 118/76 | HR 71 | Ht 61.5 in | Wt 168.0 lb

## 2014-09-27 DIAGNOSIS — R748 Abnormal levels of other serum enzymes: Secondary | ICD-10-CM | POA: Diagnosis not present

## 2014-09-27 LAB — HEPATIC FUNCTION PANEL
ALT: 42 U/L — ABNORMAL HIGH (ref 0–35)
AST: 36 U/L (ref 0–37)
Albumin: 4.1 g/dL (ref 3.5–5.2)
Alkaline Phosphatase: 97 U/L (ref 39–117)
Bilirubin, Direct: 0.3 mg/dL (ref 0.0–0.3)
Total Bilirubin: 0.9 mg/dL (ref 0.2–1.2)
Total Protein: 7.7 g/dL (ref 6.0–8.3)

## 2014-09-27 NOTE — Progress Notes (Signed)
CARDIOLOGY OFFICE NOTE  Date:  09/27/2014    Kari Harrison Date of Birth: 05/11/1955 Medical Record #[id number]  PCP:  Neldon Labella, MD  Cardiologist:  Shirlee Latch    No chief complaint on file.   History of Present Illness: Kari Harrison is a 59 y.o. female who presents today for a follow up visit. This is an approximate 2 1/2 month check. Seen for Dr. Shirlee Latch. She has CAD, HTn, tobacco abuse, lung nodules and HLD.  Presented back in March after cardiac CT showing severe 3 VD - s/p PCI of the LCX.  Saw Marvis Repress, NP here in April - she was doing well.  Comes back today. Here alone. She is doing well. Smoking down to 1/2 pack per week - where it was 1/2 pack a day. 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 No chest pain. Breathing is good. She will have a rare pain in her upper right thigh - feels most likely like arthritis. For repeat LFTs - will get today. Has had a repeat CT scan as well - those results were shared with her.    Past Medical History  Diagnosis Date  . Hypertension   . CAD (coronary artery disease)     a. Cardiac CTA: severe 3VD;  b. 05/2014 Cath/PCI: LM nl, LAD 50-60p/m, 2m, D1 50ost/m, RI small/plaque, LCX dominant 20-30p, 49m, 60/90d (2.25x24 Promus DES), OM2 40-54m, RCA nondom, mod diff plaque, EF 55%.  . Tobacco abuse   . Pulmonary nodules     a. 05/2014 CTA: left lung pulm nodules w/ rec for 3 month f/u noncontrast CT.  Marland Kitchen Hyperlipidemia     Past Surgical History  Procedure Laterality Date  . Cesarean section    . Left heart catheterization with coronary angiogram N/A 06/16/2014    Procedure: LEFT HEART CATHETERIZATION WITH CORONARY ANGIOGRAM;  Surgeon: Dolores Patty, MD;  Location: [facility] CATH LAB;  Service: Cardiovascular;  Laterality: N/A;     Medications: Current Outpatient Prescriptions  Medication Sig Dispense Refill  . aspirin 81 MG tablet Take 1 tablet  (81 mg total) by mouth daily. 30 tablet   . atenolol (TENORMIN) 50 MG tablet Take 50 mg by mouth daily.  0  . atorvastatin (LIPITOR) 80 MG tablet Take 1 tablet (80 mg total) by mouth daily. 30 tablet 6  . clopidogrel (PLAVIX) 75 MG tablet Take 1 tablet (75 mg total) by mouth daily with breakfast. 30 tablet 6  . lisinopril-hydrochlorothiazide (PRINZIDE,ZESTORETIC) 20-12.5 MG per tablet Take 2 tablets by mouth daily.  0  . nitroGLYCERIN (NITROSTAT) 0.4 MG SL tablet Place 1 tablet (0.4 mg total) under the tongue every 5 (five) minutes x 3 doses as needed for chest pain. 25 tablet 3  . venlafaxine XR (EFFEXOR-XR) 75 MG 24 hr capsule Take 75 mg by mouth daily.  0   No current facility-administered medications for this visit.    Allergies: No Known Allergies  Social History: The patient  reports that she has been smoking Cigarettes.  She has a 4.75 pack-year smoking history. She does not have any smokeless tobacco history on file. She reports that she does not drink alcohol.   Family History: The patient's family history includes Arthritis in her mother; Heart attack in her father; Hypertension in her sister; Macular degeneration in her mother.   Review of Systems: Please see the history of present illness.  Otherwise, the review of systems is positive for none.   All other systems are reviewed and negative.   Physical Exam: VS:  BP 118/76 mmHg  Pulse 71  Ht 5' 1.5" (1.562 m)  Wt 168 lb (76.204 kg)  BMI 31.23 kg/m2 .  BMI Body mass index is 31.23 kg/(m^2).  Wt Readings from Last 3 Encounters:  09/27/14 168 lb (76.204 kg)  09/14/14 168 lb (76.204 kg)  07/12/14 168 lb 6.4 oz (76.386 kg)    General: Pleasant. Well developed, well nourished and in no acute distress.  HEENT: Normal. Neck: Supple, no JVD, carotid bruits, or masses noted.  Cardiac: Regular rate and rhythm. No murmurs, rubs, or gallops. No edema. Distal pulses are 2+ Respiratory:  Lungs are clear to auscultation  bilaterally with normal work of breathing.  GI: Soft and nontender.  MS: No deformity or atrophy. Gait and ROM intact. Skin: Warm and dry. Color is normal.  Neuro:  Strength and sensation are intact and no gross focal deficits noted.  Psych: Alert, appropriate and with normal affect.   LABORATORY DATA:   EKG:  EKG is not ordered today.   Lab Results  Component Value Date   WBC 10.1 06/17/2014   HGB 13.2 06/17/2014   HCT 39.9 06/17/2014   PLT 272 06/17/2014   GLUCOSE 118* 06/17/2014   CHOL 119 08/25/2014   TRIG 126.0 08/25/2014   HDL 38.10* 08/25/2014   LDLCALC 56 08/25/2014   ALT 46* 08/25/2014   AST 36 08/25/2014   NA 140 06/17/2014   K 4.1 06/17/2014   CL 111 06/17/2014   CREATININE 0.79 06/17/2014   BUN 11 06/17/2014   CO2 22 06/17/2014   INR 0.98 06/15/2014    BNP (last 3 results) No results for input(s): BNP in the last 8760 hours.  ProBNP (last 3 results) No results for input(s): PROBNP in the last 8760 hours.   Other Studies Reviewed Today: PROCEDURE: PCI distal left circumflex.  INDICATIONS: Unstable angina  The risks, benefits, and details of the procedure were explained to the patient. The patient verbalized understanding and wanted to proceed. Informed written consent was obtained.  PROCEDURE TECHNIQUE: Dr. Jones Broom performed the diagnostic cardiac cath which revealed severe distal circumflex disease. An EBU 3.0 guide catheter was used to engage the left main. An EBU 3.5 would have fit better. A pro-water wire was placed across the disease in the circumflex. A 2.0 x 12 balloon was used to predilate. A 2.25 x 24 Promus drug-eluting stent was deployed across the area of disease in the distal circumflex. The stent was deployed. The stent was postdilated with a 2.5 x 12 noncompliant balloon, inflated high pressure. Both branches which were jailed had TIMI-3 flow. Intracoronary nitroglycerin was given. There was an excellent angiographic result.  Angio-Seal was used for was used for hemostasisIn the right groin. A TR band had been placed on the right wrist as they attempted radial access but due to a loop, were unsuccessful.  CONTRAST: Total of 95 cc.  COMPLICATIONS: None.    IMPRESSIONS:  1. Successful PCI of the distal left circumflex artery with a 2.25 x 24 Promus drug-eluting stent, postdilated to greater than 2.5 mm in diameter. 2. Unsuccessful attempt at diagnostic cardiac cath from the right radial approach due to radial loop.  RECOMMENDATION: Continue dual antiplatelet therapy for at least a year. Continue aggressive secondary prevention.   She'll be watched overnight. Possible discharge tomorrow. Further cardiac care per Dr. Shirlee Latch or Dr. Katrinka Blazing.  Left main: Mild calcification. No significant stenosis.   LAD: Long vessel wraps apex. 50-60% lesion in proximal to mid LAD prior to take-off of first diagonal. There is a 40% lesion in the mid LAD just after the D1 take-off. Mild plaque in distal LAD. In large D1 there is 50% lesion ostially and 50% lesion in midsection.  LCX: Small ramus with moderate ostial plaque. Large dominant circumflex. Gives off large branching OM-1. 3 PLS and a PDA. In the proximal LCX there is a 20-30% stenosis. In the mid LCX there is a 50% lesion just after the take-off of the OM-1. In the distal LCX there is a 60% lesion after the first PL and a 90% lesion prior to the 2nd PL. In the large OM-1 there is a 40-50% mid lesion   RCA: Moderate sized non-dominant vessel with moderate diffuse plaque.  LV-gram done in the RAO projection: Ejection fraction = 55% nor regional wall motion abnormalities  Assessment: 1. 3v CAD with high grade lesion in distal portion of dominant LCX otherwise non-obstructive CAD. 2. Normal LVEF  Plan/Discussion:  PCI LCX. Aggressive risk factor management.   Arvilla Meresaniel Bensimhon MD 5:06 PM    CT Chest IMPRESSION: 1. Previously noted pulmonary nodules  in the left lung have completely resolved, compatible with benign infectious or inflammatory nodules on the prior study. 2. Atherosclerosis, including three-vessel coronary artery disease, status post PTCI to the left circumflex coronary artery.   Electronically Signed  By: Trudie Reedaniel Entrikin M.D.  On: 09/22/2014 09:14  Assessment/Plan: 1. Coronary artery disease: Status post admission for chest pain with abnormal cardiac CTA and subsequent catheterization revealing severe circumflex disease from March of 2016. LCX was successfully stented. She has residual, diffuse moderate coronary artery disease. She has not been having any chest pain or dyspnea. She remains on aspirin, beta blocker, statin, Plavix, and ACE inhibitor therapy. She is tolerating all of her medications well. She continues to do well.  2. Hypertension: Stable on the beta blocker, ACE inhibitor, and diuretic.  3. Hyperlipidemia: on statin therapy  4. Ongoing tobacco abuse: She continues to cut back - complete cessation advised.  5. Left lung nodules: resolved on repeat study - these results were shared with her today.   6. Mild elevation of LFTs - recheck today since she is here.   Current medicines are reviewed with the patient today.  The patient does not have concerns regarding medicines other than what has been noted above.  The following changes have been made:  See above.  Labs/ tests ordered today include:   No orders of the defined types were placed in this encounter.     Disposition:   FU with Dr. Shirlee LatchMcLean in 6 months.   Patient is agreeable to this plan and will call if any problems develop in the interim.   Signed: Rosalio MacadamiaLori C. Obelia Bonello, RN, ANP-C 09/27/2014 2:04 PM  [facility] [address] [place], [place]  [address] Phone: [phone] Fax: [phone]

## 2014-09-27 NOTE — Patient Instructions (Signed)
We will be checking the following labs today - HPF  Cancel lab visit for 7/18   Medication Instructions:    Continue with your current medicines.     Testing/Procedures To Be Arranged:  N/A  Follow-Up:   See Dr. Shirlee LatchMcLean in 6 months    Other Special Instructions:   Keep working on your smoking  Call the [facility] office at [phone] if you have any questions, problems or concerns.

## 2014-10-16 ENCOUNTER — Other Ambulatory Visit: Payer: 59

## 2014-10-27 ENCOUNTER — Encounter: Payer: Self-pay | Admitting: Emergency Medicine

## 2014-10-27 ENCOUNTER — Ambulatory Visit (INDEPENDENT_AMBULATORY_CARE_PROVIDER_SITE_OTHER): Payer: 59 | Admitting: Emergency Medicine

## 2014-10-27 VITALS — BP 110/78 | HR 68 | Ht 61.5 in | Wt 168.0 lb

## 2014-10-27 DIAGNOSIS — R918 Other nonspecific abnormal finding of lung field: Secondary | ICD-10-CM

## 2014-10-27 DIAGNOSIS — Z720 Tobacco use: Secondary | ICD-10-CM | POA: Diagnosis not present

## 2014-10-27 NOTE — Patient Instructions (Signed)
Your CT scan of the chest shows that the pulmonary nodules previously seen have completely resolved. He will not need any further scans. Continue to work hard on smoking cessation. We discussed some techniques today. Feel free to call our office if we can help in any way with efforts to stop. Take advantage of 1-800-QUIT-NOW. Follow with Dr Delton Coombes as needed

## 2014-10-27 NOTE — Assessment & Plan Note (Signed)
Most of the visit today was spent discussing techniques for smoking cessation.. I asked her to contact us with help in any way with her efforts. She is ready to set quit date and will do so. She will use Nicorette gum to wean herself off the nicotine

## 2014-10-27 NOTE — Progress Notes (Signed)
Subjective:    Patient ID: Kari Harrison, female    DOB: December 20, 1955, 59 y.o.   MRN: [id number]  HPI 59 year old active smoker (10 pack-years) with hypertension, CAD recently identified and s/p L heart cath and stent placement. She is very active. Denies any wheezing. She does have some occasional cough especially in the last week which she ascribes to allergies. She does not have any history of lung cancer or any other malignancy.    I have reviewed her cardiac CT scan from March 2016 that reveals left lower lobe nodules, the largest of which is approximately 1 cm in diameter. She is referred regarding the pulmonary nodules. I have reviewed her cardiology notes including her catheterization report.   ROV 10/27/14 -- follow up visit for tobacco use, pulmonary nodules that were identified on cardiac CT scan 06/15/14. She is now s/p repeat Ct chest 09/22/14 to follow the nodules > I have reviewed the study myself and the nodules have all resolved. We discussed this today, and discussed her efforts to stop smoking.    Review of Systems  Constitutional: Negative for fever and unexpected weight change.  HENT: Negative for congestion, dental problem, ear pain, nosebleeds, postnasal drip, rhinorrhea, sinus pressure, sneezing, sore throat and trouble swallowing.   Eyes: Negative for redness and itching.  Respiratory: Negative for cough, chest tightness, shortness of breath and wheezing.   Cardiovascular: Negative for palpitations and leg swelling.  Gastrointestinal: Negative for nausea and vomiting.       Heartburn  Genitourinary: Negative for dysuria.  Musculoskeletal: Negative for joint swelling.  Skin: Negative for rash.  Neurological: Negative for headaches.  Hematological: Does not bruise/bleed easily.  Psychiatric/Behavioral: Negative for dysphoric mood. The patient is not nervous/anxious.     Past Medical History  Diagnosis Date  . Hypertension   . CAD (coronary artery disease)     a.  Cardiac CTA: severe 3VD;  b. 05/2014 Cath/PCI: LM nl, LAD 50-60p/m, 55m, D1 50ost/m, RI small/plaque, LCX dominant 20-30p, 55m, 60/90d (2.25x24 Promus DES), OM2 40-85m, RCA nondom, mod diff plaque, EF 55%.  . Tobacco abuse   . Pulmonary nodules     a. 05/2014 CTA: left lung pulm nodules w/ rec for 3 month f/u noncontrast CT.  Marland Kitchen Hyperlipidemia      Family History  Problem Relation Age of Onset  . Arthritis Mother   . Macular degeneration Mother   . Heart attack Father   . Hypertension Sister      History   Social History  . Marital Status: Married    Spouse Name: N/A  . Number of Children: N/A  . Years of Education: N/A   Occupational History  . Not on file.   Social History Main Topics  . Smoking status: Current Every Day Smoker -- 0.25 packs/day for 19 years    Types: Cigarettes  . Smokeless tobacco: Not on file  . Alcohol Use: No  . Drug Use: Not on file  . Sexual Activity: Not on file   Other Topics Concern  . 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   No Known Allergies   Outpatient Prescriptions Prior to Visit  Medication Sig Dispense Refill  . aspirin 81 MG tablet Take 1 tablet (81 mg total) by mouth daily. 30 tablet   . atenolol (TENORMIN) 50 MG tablet Take 50 mg by mouth daily.  0  . atorvastatin (LIPITOR) 80 MG tablet Take 1 tablet (80 mg  total) by mouth daily. 30 tablet 6  . clopidogrel (PLAVIX) 75 MG tablet Take 1 tablet (75 mg total) by mouth daily with breakfast. 30 tablet 6  . lisinopril-hydrochlorothiazide (PRINZIDE,ZESTORETIC) 20-12.5 MG per tablet Take 2 tablets by mouth daily.  0  . nitroGLYCERIN (NITROSTAT) 0.4 MG SL tablet Place 1 tablet (0.4 mg total) under the tongue every 5 (five) minutes x 3 doses as needed for chest pain. 25 tablet 3  . venlafaxine XR (EFFEXOR-XR) 75 MG 24 hr capsule Take 75 mg by mouth daily.  0   No facility-administered medications prior to visit.         Objective:   Physical  Exam Filed Vitals:   10/27/14 0857  BP: 110/78  Pulse: 68  Height: 5' 1.5" (1.562 m)  Weight: 168 lb (76.204 kg)  SpO2: 93%   Gen: Pleasant, well-nourished, in no distress,  normal affect  ENT: No lesions,  mouth clear,  oropharynx clear, no postnasal drip  Neck: No JVD, no TMG, no carotid bruits  Lungs: No use of accessory muscles, clear without rales or rhonchi  Cardiovascular: RRR, heart sounds normal, no murmur or gallops, no peripheral edema  Musculoskeletal: No deformities, no cyanosis or clubbing  Neuro: alert, non focal  Skin: Warm, no lesions or rashes  09/22/14 --  COMPARISON: Cardiac CTA 06/15/2014.  FINDINGS: Mediastinum/Lymph Nodes: Heart size is normal. There is no significant pericardial fluid, thickening or pericardial calcification. There is atherosclerosis of the thoracic aorta, the great vessels of the mediastinum and the coronary arteries, including calcified atherosclerotic plaque in the left anterior descending, left circumflex and right coronary arteries. Status post PTCI to the left circumflex coronary artery. No pathologically enlarged mediastinal or hilar lymph nodes. Esophagus is unremarkable in appearance. No axillary lymphadenopathy.  Lungs/Pleura: Previously noted left upper lobe and left lower lobe nodules have completely resolved compared to the prior examination, compatible with infectious or inflammatory nodules on study 06/15/2014. No suspicious appearing pulmonary nodules or masses are otherwise noted. No acute consolidative airspace disease. No pleural effusions.  Upper Abdomen: Unremarkable.  Musculoskeletal/Soft Tissues: There are no aggressive appearing lytic or blastic lesions noted in the visualized portions of the skeleton.  IMPRESSION: 1. Previously noted pulmonary nodules in the left lung have completely resolved, compatible with benign infectious or inflammatory nodules on the prior study. 2. Atherosclerosis,  including three-vessel coronary artery disease, status post PTCI to the left circumflex coronary artery.     Assessment & Plan:  Pulmonary nodules Completely resolved on follow-up CT scan of the chest performed on 6/17. suspect of these were inflammatory in nature. I do not believe she needs any further CT scans. She does not qualify for low-dose CT scan screening for lung cancer based on her smoking history. The most important thing for her liver now stop smoking  Tobacco abuse Most of the visit today was spent discussing techniques for smoking cessation.. I asked her to contact us with help in any way with her efforts. She is ready to set quit date and will do so. She will use Nicorette gum to wean herself off the nicotine

## 2014-10-27 NOTE — Assessment & Plan Note (Signed)
Completely resolved on follow-up CT scan of the chest performed on 6/17. suspect of these were inflammatory in nature. I do not believe she needs any further CT scans. She does not qualify for low-dose CT scan screening for lung cancer based on her smoking history. The most important thing for her liver now stop smoking

## 2015-01-12 ENCOUNTER — Other Ambulatory Visit: Payer: Self-pay

## 2015-01-12 MED ORDER — CLOPIDOGREL BISULFATE 75 MG PO TABS: 75.0000 mg | ORAL_TABLET | Freq: Every day | ORAL | Status: DC

## 2015-01-12 MED ORDER — ATORVASTATIN CALCIUM 80 MG PO TABS: 80.0000 mg | ORAL_TABLET | Freq: Every day | ORAL | Status: DC

## 2015-09-07 ENCOUNTER — Other Ambulatory Visit: Payer: Self-pay | Admitting: Cardiology

## 2015-10-20 ENCOUNTER — Other Ambulatory Visit: Payer: Self-pay | Admitting: Cardiology

## 2015-11-19 ENCOUNTER — Other Ambulatory Visit: Payer: Self-pay | Admitting: Cardiology

## 2015-12-27 ENCOUNTER — Other Ambulatory Visit: Payer: Self-pay | Admitting: Cardiology

## 2016-01-26 ENCOUNTER — Other Ambulatory Visit: Payer: Self-pay | Admitting: Cardiology

## 2016-02-05 ENCOUNTER — Telehealth (HOSPITAL_COMMUNITY): Payer: Self-pay | Admitting: Cardiology

## 2016-02-05 NOTE — Telephone Encounter (Signed)
It appears that she could come off Plavix as long as she is taking aspirin 81.

## 2016-02-05 NOTE — Telephone Encounter (Signed)
INCOMING CALL FROM PCP OFFICE DR MILLER WOULD LIKE TO KNOW IF PATIENT CAN COME OFF PLAVIX, WAS STARTED >18 MONTHS AGO FOR A STENT  PLEASE ADVISE  [facility] DR Hyacinth MeekerMILLER [phone]

## 2016-02-05 NOTE — Telephone Encounter (Signed)
She is Dalton's patient but looks like she hasn't followed up.  Dalton?

## 2016-02-06 NOTE — Telephone Encounter (Signed)
Detailed message left for Dr Rondel BatonMiller's nurse- Darl PikesSusan Patient aware she can d/c plavix and continue asa 81 mg daily

## 2016-09-13 IMAGING — CR DG CHEST 1V PORT
1 series · 1 of 1 positions shown · non-contrast
Comparison: 05/09/2009.

CLINICAL DATA: Mid chest pain and some shortness of breath.
Bronchitis 3 weeks ago. Smoker.

EXAM:
PORTABLE CHEST - 1 VIEW

[portable]
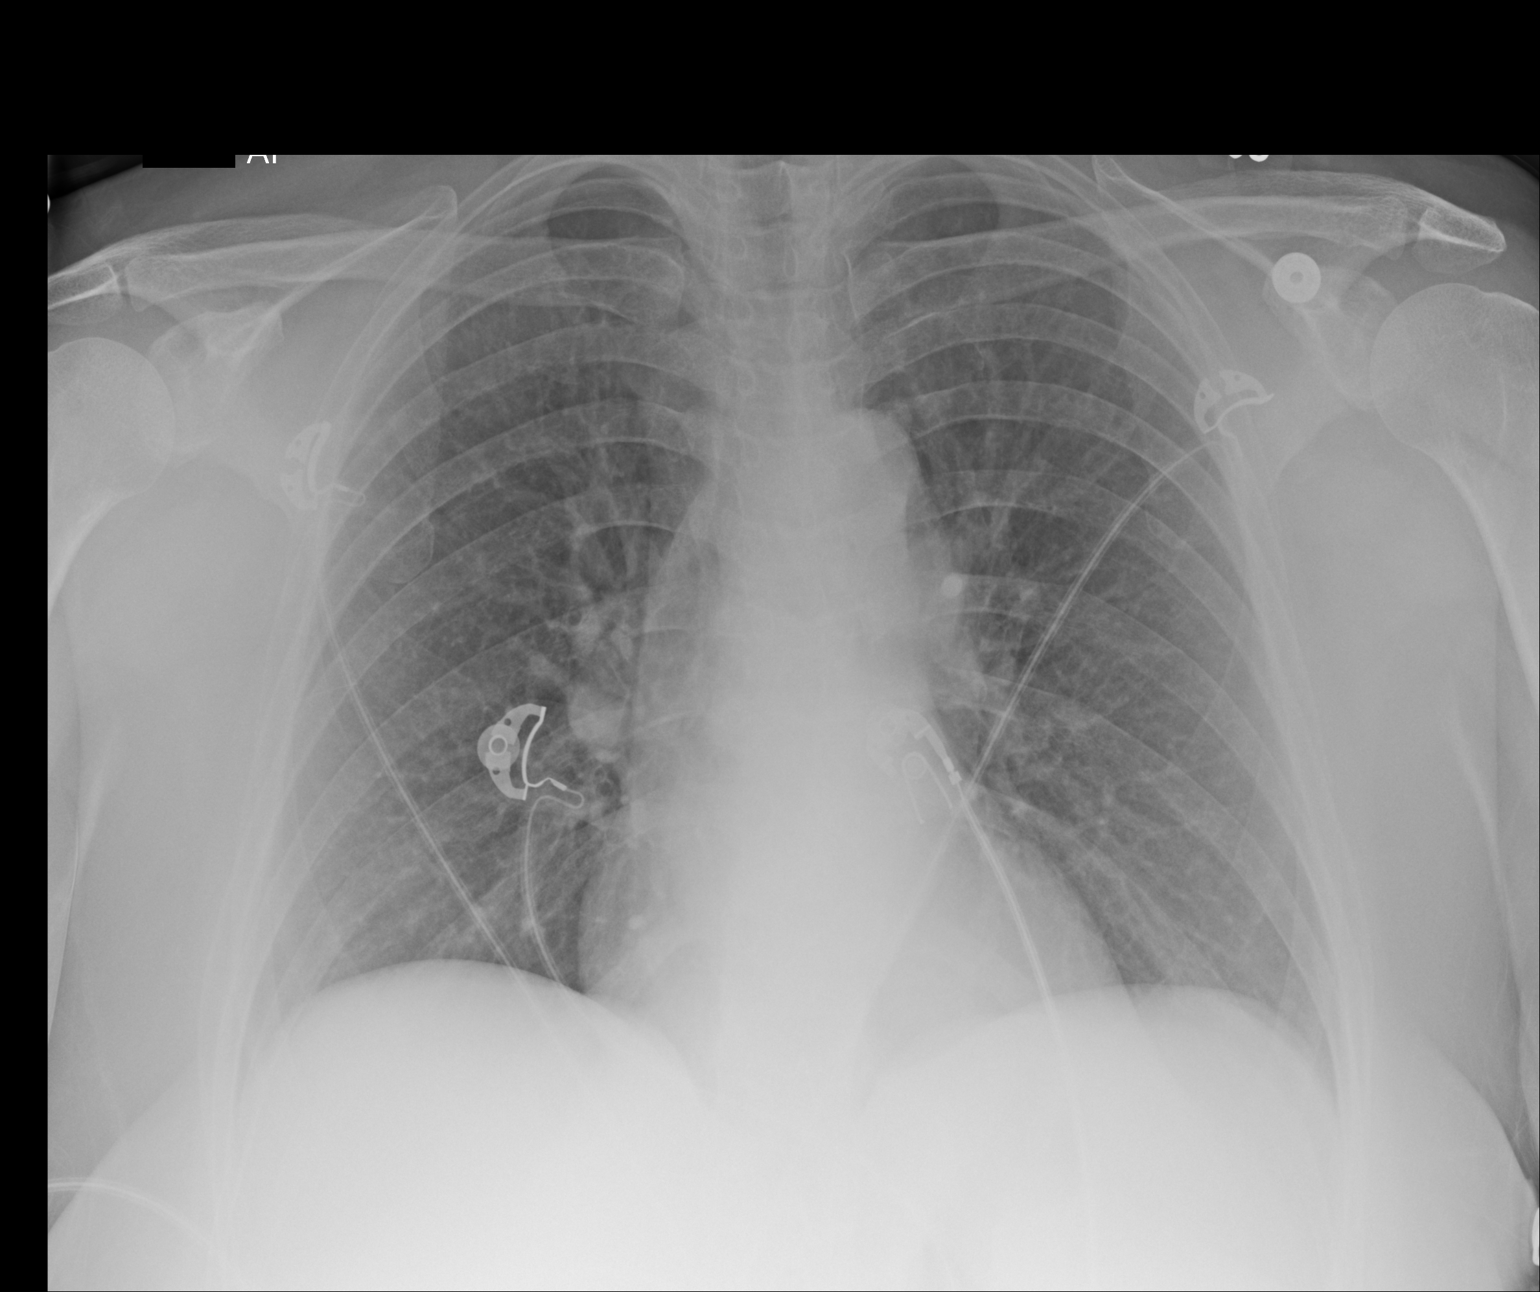

[1 of 1 positions shown; findings below may reference images not displayed]

FINDINGS: Normal sized heart. Clear lungs. Mildly prominent pulmonary
vasculature. No pleural fluid. Unremarkable bones.
IMPRESSION: Interval mild pulmonary vascular congestion.

## 2017-02-06 ENCOUNTER — Other Ambulatory Visit: Payer: Self-pay | Admitting: Family Medicine

## 2017-02-06 ENCOUNTER — Other Ambulatory Visit (HOSPITAL_COMMUNITY)
Admission: RE | Admit: 2017-02-06 | Discharge: 2017-02-06 | Disposition: A | Discharge status: Home / Self Care | Payer: 59 | Source: Ambulatory Visit | Attending: Family Medicine | Admitting: Family Medicine

## 2017-02-06 DIAGNOSIS — Z124 Encounter for screening for malignant neoplasm of cervix: Secondary | ICD-10-CM | POA: Insufficient documentation

## 2017-02-11 LAB — CYTOLOGY - PAP
Diagnosis: NEGATIVE
HPV: NOT DETECTED

## 2017-02-12 ENCOUNTER — Other Ambulatory Visit: Payer: Self-pay | Admitting: Family Medicine

## 2017-02-12 DIAGNOSIS — M8580 Other specified disorders of bone density and structure, unspecified site: Secondary | ICD-10-CM

## 2017-03-07 ENCOUNTER — Other Ambulatory Visit: Payer: Self-pay | Admitting: Family Medicine

## 2017-03-07 DIAGNOSIS — Z1231 Encounter for screening mammogram for malignant neoplasm of breast: Secondary | ICD-10-CM

## 2017-04-10 ENCOUNTER — Ambulatory Visit
Admission: RE | Admit: 2017-04-10 | Discharge: 2017-04-10 | Disposition: A | Discharge status: Home / Self Care | Payer: 59 | Source: Ambulatory Visit | Attending: Family Medicine | Admitting: Family Medicine

## 2017-04-10 DIAGNOSIS — Z1231 Encounter for screening mammogram for malignant neoplasm of breast: Secondary | ICD-10-CM

## 2017-04-10 DIAGNOSIS — M8580 Other specified disorders of bone density and structure, unspecified site: Secondary | ICD-10-CM

## 2019-02-20 ENCOUNTER — Emergency Department (HOSPITAL_COMMUNITY): Payer: Commercial Managed Care - PPO

## 2019-02-20 ENCOUNTER — Inpatient Hospital Stay (HOSPITAL_COMMUNITY)
Admission: EM | Admit: 2019-02-20 | Discharge: 2019-02-22 | DRG: 308 | Disposition: A | Discharge status: Home / Self Care | Payer: Commercial Managed Care - PPO | Attending: Internal Medicine | Admitting: Internal Medicine

## 2019-02-20 ENCOUNTER — Other Ambulatory Visit: Payer: Self-pay

## 2019-02-20 ENCOUNTER — Inpatient Hospital Stay (HOSPITAL_COMMUNITY): Payer: Commercial Managed Care - PPO

## 2019-02-20 ENCOUNTER — Encounter (HOSPITAL_COMMUNITY): Payer: Self-pay | Admitting: *Deleted

## 2019-02-20 DIAGNOSIS — E785 Hyperlipidemia, unspecified: Secondary | ICD-10-CM | POA: Diagnosis present

## 2019-02-20 DIAGNOSIS — D72829 Elevated white blood cell count, unspecified: Secondary | ICD-10-CM | POA: Diagnosis present

## 2019-02-20 DIAGNOSIS — E7849 Other hyperlipidemia: Secondary | ICD-10-CM | POA: Diagnosis not present

## 2019-02-20 DIAGNOSIS — F1210 Cannabis abuse, uncomplicated: Secondary | ICD-10-CM | POA: Diagnosis present

## 2019-02-20 DIAGNOSIS — Z720 Tobacco use: Secondary | ICD-10-CM | POA: Diagnosis present

## 2019-02-20 DIAGNOSIS — I4891 Unspecified atrial fibrillation: Secondary | ICD-10-CM

## 2019-02-20 DIAGNOSIS — Z955 Presence of coronary angioplasty implant and graft: Secondary | ICD-10-CM | POA: Diagnosis not present

## 2019-02-20 DIAGNOSIS — G92 Toxic encephalopathy: Secondary | ICD-10-CM | POA: Diagnosis present

## 2019-02-20 DIAGNOSIS — F17210 Nicotine dependence, cigarettes, uncomplicated: Secondary | ICD-10-CM | POA: Diagnosis present

## 2019-02-20 DIAGNOSIS — Z20828 Contact with and (suspected) exposure to other viral communicable diseases: Secondary | ICD-10-CM | POA: Diagnosis present

## 2019-02-20 DIAGNOSIS — R739 Hyperglycemia, unspecified: Secondary | ICD-10-CM | POA: Diagnosis present

## 2019-02-20 DIAGNOSIS — I10 Essential (primary) hypertension: Secondary | ICD-10-CM | POA: Diagnosis present

## 2019-02-20 DIAGNOSIS — I2510 Atherosclerotic heart disease of native coronary artery without angina pectoris: Secondary | ICD-10-CM | POA: Diagnosis present

## 2019-02-20 DIAGNOSIS — Z8249 Family history of ischemic heart disease and other diseases of the circulatory system: Secondary | ICD-10-CM | POA: Diagnosis not present

## 2019-02-20 DIAGNOSIS — Z7982 Long term (current) use of aspirin: Secondary | ICD-10-CM

## 2019-02-20 DIAGNOSIS — E7800 Pure hypercholesterolemia, unspecified: Secondary | ICD-10-CM | POA: Diagnosis not present

## 2019-02-20 DIAGNOSIS — Z79899 Other long term (current) drug therapy: Secondary | ICD-10-CM | POA: Diagnosis not present

## 2019-02-20 DIAGNOSIS — A419 Sepsis, unspecified organism: Secondary | ICD-10-CM

## 2019-02-20 LAB — HEMOGLOBIN A1C
Hgb A1c MFr Bld: 6.1 % — ABNORMAL HIGH (ref 4.8–5.6)
Mean Plasma Glucose: 128.37 mg/dL

## 2019-02-20 LAB — CBC
HCT: 50.8 % — ABNORMAL HIGH (ref 36.0–46.0)
Hemoglobin: 17 g/dL — ABNORMAL HIGH (ref 12.0–15.0)
MCH: 31.3 pg (ref 26.0–34.0)
MCHC: 33.5 g/dL (ref 30.0–36.0)
MCV: 93.6 fL (ref 80.0–100.0)
Platelets: 334 10*3/uL (ref 150–400)
RBC: 5.43 MIL/uL — ABNORMAL HIGH (ref 3.87–5.11)
RDW: 13.2 % (ref 11.5–15.5)
WBC: 12.5 10*3/uL — ABNORMAL HIGH (ref 4.0–10.5)
nRBC: 0 % (ref 0.0–0.2)

## 2019-02-20 LAB — RAPID URINE DRUG SCREEN, HOSP PERFORMED
Amphetamines: NOT DETECTED
Barbiturates: NOT DETECTED
Benzodiazepines: NOT DETECTED
Cocaine: NOT DETECTED
Opiates: NOT DETECTED
Tetrahydrocannabinol: POSITIVE — AB

## 2019-02-20 LAB — HIV ANTIBODY (ROUTINE TESTING W REFLEX): HIV Screen 4th Generation wRfx: NONREACTIVE

## 2019-02-20 LAB — BASIC METABOLIC PANEL
Anion gap: 12 (ref 5–15)
BUN: 15 mg/dL (ref 8–23)
CO2: 22 mmol/L (ref 22–32)
Calcium: 9.6 mg/dL (ref 8.9–10.3)
Chloride: 107 mmol/L (ref 98–111)
Creatinine, Ser: 0.84 mg/dL (ref 0.44–1.00)
GFR calc Af Amer: >60 mL/min (ref >60)
GFR calc non Af Amer: >60 mL/min (ref >60)
Glucose, Bld: 164 mg/dL — ABNORMAL HIGH (ref 70–99)
Potassium: 3.5 mmol/L (ref 3.5–5.1)
Sodium: 141 mmol/L (ref 135–145)

## 2019-02-20 LAB — TROPONIN I (HIGH SENSITIVITY)
Troponin I (High Sensitivity): 11 ng/L (ref <18)
Troponin I (High Sensitivity): 15 ng/L (ref <18)

## 2019-02-20 LAB — ECHOCARDIOGRAM COMPLETE
Height: 62 in
Weight: 2720 oz

## 2019-02-20 LAB — TSH: TSH: 4.492 u[IU]/mL (ref 0.350–4.500)

## 2019-02-20 LAB — GLUCOSE, CAPILLARY
Glucose-Capillary: 98 mg/dL (ref 70–99)
Glucose-Capillary: 115 mg/dL — ABNORMAL HIGH (ref 70–99)
Glucose-Capillary: 118 mg/dL — ABNORMAL HIGH (ref 70–99)

## 2019-02-20 LAB — MAGNESIUM: Magnesium: 1.8 mg/dL (ref 1.7–2.4)

## 2019-02-20 LAB — SARS CORONAVIRUS 2 (TAT 6-24 HRS): SARS Coronavirus 2: NEGATIVE

## 2019-02-20 MED ORDER — ACETAMINOPHEN 325 MG PO TABS
650.0000 mg | ORAL_TABLET | ORAL | Status: DC | PRN
  Administered 2019-02-21 (×2): 650 mg via ORAL
  Filled 2019-02-20 (×2): qty 2

## 2019-02-20 MED ORDER — DILTIAZEM LOAD VIA INFUSION
10.0000 mg | Freq: Once | INTRAVENOUS | Status: AC
  Administered 2019-02-20: 10 mg via INTRAVENOUS
  Filled 2019-02-20: qty 10

## 2019-02-20 MED ORDER — DILTIAZEM HCL-DEXTROSE 125-5 MG/125ML-% IV SOLN (PREMIX)
5.0000 mg/h | INTRAVENOUS | Status: DC
  Administered 2019-02-20 – 2019-02-21 (×2): 5 mg/h via INTRAVENOUS
  Filled 2019-02-20: qty 125

## 2019-02-20 MED ORDER — VENLAFAXINE HCL ER 75 MG PO CP24
75.0000 mg | ORAL_CAPSULE | Freq: Every day | ORAL | Status: DC
  Administered 2019-02-21 – 2019-02-22 (×2): 75 mg via ORAL
  Filled 2019-02-20 (×4): qty 1

## 2019-02-20 MED ORDER — ASPIRIN EC 81 MG PO TBEC
81.0000 mg | DELAYED_RELEASE_TABLET | Freq: Every day | ORAL | Status: DC
  Administered 2019-02-21 – 2019-02-22 (×2): 81 mg via ORAL
  Filled 2019-02-20 (×2): qty 1

## 2019-02-20 MED ORDER — SODIUM CHLORIDE 0.9% FLUSH: 3.0000 mL | Freq: Once | INTRAVENOUS | Status: DC

## 2019-02-20 MED ORDER — PANTOPRAZOLE SODIUM 40 MG PO TBEC
40.0000 mg | DELAYED_RELEASE_TABLET | Freq: Every day | ORAL | Status: DC
  Administered 2019-02-21 – 2019-02-22 (×2): 40 mg via ORAL
  Filled 2019-02-20 (×2): qty 2

## 2019-02-20 MED ORDER — APIXABAN 5 MG PO TABS
5.0000 mg | ORAL_TABLET | Freq: Two times a day (BID) | ORAL | Status: DC
  Administered 2019-02-20 – 2019-02-22 (×5): 5 mg via ORAL
  Filled 2019-02-20 (×6): qty 1

## 2019-02-20 MED ORDER — ASPIRIN 81 MG PO CHEW
324.0000 mg | CHEWABLE_TABLET | Freq: Once | ORAL | Status: AC
  Administered 2019-02-20: 324 mg via ORAL
  Filled 2019-02-20: qty 4

## 2019-02-20 MED ORDER — ATORVASTATIN CALCIUM 80 MG PO TABS
80.0000 mg | ORAL_TABLET | Freq: Every day | ORAL | Status: DC
  Administered 2019-02-21: 80 mg via ORAL
  Filled 2019-02-20: qty 1

## 2019-02-20 MED ORDER — DILTIAZEM HCL-DEXTROSE 125-5 MG/125ML-% IV SOLN (PREMIX)
5.0000 mg/h | INTRAVENOUS | Status: DC
  Administered 2019-02-20: 5 mg/h via INTRAVENOUS
  Filled 2019-02-20: qty 125

## 2019-02-20 MED ORDER — INSULIN ASPART 100 UNIT/ML ~~LOC~~ SOLN
0.0000 [IU] | Freq: Three times a day (TID) | SUBCUTANEOUS | Status: DC
  Administered 2019-02-21: 2 [IU] via SUBCUTANEOUS

## 2019-02-20 MED ORDER — INSULIN ASPART 100 UNIT/ML ~~LOC~~ SOLN: 0.0000 [IU] | Freq: Every day | SUBCUTANEOUS | Status: DC

## 2019-02-20 MED ORDER — ONDANSETRON HCL 4 MG/2ML IJ SOLN: 4.0000 mg | Freq: Four times a day (QID) | INTRAMUSCULAR | Status: DC | PRN

## 2019-02-20 MED ORDER — LORATADINE 10 MG PO TABS
10.0000 mg | ORAL_TABLET | Freq: Every day | ORAL | Status: DC
  Administered 2019-02-21 – 2019-02-22 (×2): 10 mg via ORAL
  Filled 2019-02-20 (×2): qty 1

## 2019-02-20 NOTE — Progress Notes (Signed)
Echocardiogram 2D Echocardiogram has been performed.  Oneal Deputy Ladarian Bonczek 02/20/2019, 12:07 PM

## 2019-02-20 NOTE — ED Provider Notes (Signed)
TIME SEEN: 5:33 AM  CHIEF COMPLAINT: Palpitations, chest pain and shortness of breath  HPI: Patient is a 63 year old female with history of CAD status post stent followed by [facility] cardi[facility], hypertension, hyperlipidemia, tobacco use who presents to the emergency department with complaints of palpitations and feeling like her heart was beating out of her chest that started about an hour prior to arrival while sleeping.  States it woke her up from sleep.  Feels diffuse chest tightness and shortness of breath.  No recent fevers, cough, vomiting or diarrhea.  Was in normal state of health previous to this.  No known history of A. fib.  Not on anticoagulation.  No previous palpitations.  No history of heavy alcohol use.  No increase stimulant intake.  No history of illicit drug use.  PCP - Dr. [name] with [facility]  ROS: See HPI Constitutional: no fever  Eyes: no drainage  ENT: no runny nose   Cardiovascular:  chest pain  Resp: SOB  GI: no vomiting GU: no dysuria Integumentary: no rash  Allergy: no hives  Musculoskeletal: no leg swelling  Neurological: no slurred speech ROS otherwise negative  PAST MEDICAL HISTORY/PAST SURGICAL HISTORY:  Past Medical History:  Diagnosis Date  . CAD (coronary artery disease)    a. Cardiac CTA: severe 3VD;  b. 05/2014 Cath/PCI: LM nl, LAD 50-60p/m, 78m, D1 50ost/m, RI small/plaque, LCX dominant 20-30p, 96m, 60/90d (2.25x24 Promus DES), OM2 40-90m, RCA nondom, mod diff plaque, EF 55%.  Marland Kitchen Hyperlipidemia   . Hypertension   . Pulmonary nodules    a. 05/2014 CTA: left lung pulm nodules w/ rec for 3 month f/u noncontrast CT.  . Tobacco abuse     MEDICATIONS:  Prior to Admission medications   Medication Sig Start Date End Date Taking? Authorizing Provider  aspirin 81 MG tablet Take 1 tablet (81 mg total) by mouth daily. 06/17/14   Theora Gianotti, NP  atenolol (TENORMIN) 50 MG tablet Take 50 mg by mouth daily. 06/12/14   [provider]   atorvastatin (LIPITOR) 80 MG tablet take 1 tablet by mouth once daily ; MAKE DOCTOR'S APPOINTMENT ! 01/28/16   Larey Dresser, MD  lisinopril-hydrochlorothiazide (PRINZIDE,ZESTORETIC) 20-12.5 MG per tablet Take 2 tablets by mouth daily. 06/12/14   [provider]  nitroGLYCERIN (NITROSTAT) 0.4 MG SL tablet Place 1 tablet (0.4 mg total) under the tongue every 5 (five) minutes x 3 doses as needed for chest pain. 06/17/14   Theora Gianotti, NP  venlafaxine XR (EFFEXOR-XR) 75 MG 24 hr capsule Take 75 mg by mouth daily. 06/12/14   [provider]    ALLERGIES:  No Known Allergies  SOCIAL HISTORY:  Social History   Tobacco Use  . Smoking status: Current Every Day Smoker    Packs/day: 0.25    Years: 19.00    Pack years: 4.75    Types: Cigarettes  Substance Use Topics  . Alcohol use: No    Alcohol/week: 0.0 standard drinks    FAMILY HISTORY: Family History  Problem Relation Age of Onset  . Arthritis Mother   . Macular degeneration Mother   . Heart attack Father   . Hypertension Sister     EXAM: BP (!) 131/94   Pulse (!) 157   Temp 98.2 F (36.8 C) (Oral)   Resp 19   SpO2 97%  CONSTITUTIONAL: Alert and oriented and responds appropriately to questions. Well-appearing; well-nourished HEAD: Normocephalic EYES: Conjunctivae clear, pupils appear equal, EOM appear intact ENT: normal  nose; moist mucous membranes NECK: Supple, normal ROM CARD: Irregularly irregular and tachycardic; S1 and S2 appreciated; no murmurs, no clicks, no rubs, no gallops RESP: Normal chest excursion without splinting or tachypnea; breath sounds clear and equal bilaterally; no wheezes, no rhonchi, no rales, no hypoxia or respiratory distress, speaking full sentences ABD/GI: Normal bowel sounds; non-distended; soft, non-tender, no rebound, no guarding, no peritoneal signs, no hepatosplenomegaly BACK:  The back appears normal EXT: Normal ROM in all joints; no deformity noted, no  edema; no cyanosis SKIN: Normal color for age and race; warm; no rash on exposed skin NEURO: Moves all extremities equally PSYCH: The patient's mood and manner are appropriate.   MEDICAL DECISION MAKING: Patient here with A. fib with RVR.  We had discussion about risk and benefits of chemical cardioversion versus electrocardioversion.  I feel she would be a candidate for electrical cardioversion given onset of symptoms within the past 2 hours.  She states that she does not feel comfortable at this time with electrocardioversion and would like to start IV medications and be admitted to the hospital.  Will obtain labs including electrolytes, TSH.  Will start IV Cardizem and admit.  ED PROGRESS: Patient's labs unremarkable.  Normal electrolytes.  Negative troponin.  Chest x-ray shows chronic bronchial thickening.  Will discuss with medicine for admission.   6:46 AM Discussed patient's case with hospitalist, Dr. Selena Batten.  I have recommended admission and patient (and family if present) agree with this plan. Admitting physician will place admission orders.   I reviewed all nursing notes, vitals, pertinent previous records and interpreted all EKGs, lab and urine results, imaging (as available).     EKG Interpretation  Date/Time:  Sunday February 20 2019 04:38:13 EST Ventricular Rate:  163 PR Interval:    QRS Duration: 68 QT Interval:  294 QTC Calculation: 484 R Axis:   6 Text Interpretation: Atrial fibrillation with rapid ventricular response Anterior infarct , age undetermined ST & T wave abnormality, consider inferolateral ischemia Abnormal ECG A fib is new compareed to prior Confirmed by Rochele Raring [phone]) on 02/20/2019 5:27:23 AM        CRITICAL CARE Performed by: Baxter Hire    Total critical care time: 50 minutes  Critical care time was exclusive of separately billable procedures and treating other patients.  Critical care was necessary to treat or prevent imminent or  life-threatening deterioration.  Critical care was time spent personally by me on the following activities: development of treatment plan with patient and/or surrogate as well as nursing, discussions with consultants, evaluation of patient's response to treatment, examination of patient, obtaining history from patient or surrogate, ordering and performing treatments and interventions, ordering and review of laboratory studies, ordering and review of radiographic studies, pulse oximetry and re-evaluation of patient's condition.   Kari Harrison was evaluated in Emergency Department on 02/20/2019 for the symptoms described in the history of present illness. She was evaluated in the context of the global COVID-19 pandemic, which necessitated consideration that the patient might be at risk for infection with the SARS-CoV-2 virus that causes COVID-19. Institutional protocols and algorithms that pertain to the evaluation of patients at risk for COVID-19 are in a state of rapid change based on information released by regulatory bodies including the CDC and federal and state organizations. These policies and algorithms were followed during the patient's care in the ED.  Patient was seen wearing N95, face shield, gloves.    , Kari Maw, DO 02/20/19 [phone]

## 2019-02-20 NOTE — Progress Notes (Signed)
   02/20/19 2033  Vitals  ECG Heart Rate (!) 58  Resp (!) 21  MEWS Score  MEWS RR 1  MEWS Pulse 0  MEWS Systolic 0  MEWS LOC 0  MEWS Temp 0  MEWS Score 1  MEWS Score Color Green  Provider Notification  Provider Name/Title Dr. Kalman Shan  Date Provider Notified 02/20/19  Time Provider Notified 2015  Notification Type Page  Notification Reason Other (Comment) (Pt's HR in the 50's SB sustained. pt on cardizem drip.)  Response Other (Comment) (awaiting call back)

## 2019-02-20 NOTE — Progress Notes (Signed)
Paged [facility] to inform that patient converted out of A fib and HR in the 50's right around 1855. Spoke with [facility] NP. Orders received to keep patient on Cardizem gtt and obtain EKG.

## 2019-02-20 NOTE — ED Triage Notes (Addendum)
Pt woke up this morning feeling like her heart rate was high; took her blood pressure and it was noted to be 683 systolic. Denies dizziness; reports sob at times; denies chest pain. Pulse noted to be irregular on palpation

## 2019-02-20 NOTE — H&P (Signed)
History and Physical    Kari Harrison GGY:[id number] DOB: 1955/06/07 DOA: 02/20/2019  PCP: Kari Hazel, MD Consultants:  Kari Harrison - pulmonology; Kari Harrison - cardiology Patient coming from:  Home - lives with husband; NOK: Husband, [phone]  Chief Complaint: Palpitations  HPI: Kari Harrison is a 63 y.o. female with medical history significant of HTN; HLD: CAD s/p stent; and tobacco dependence presenting with palpitations.  She reports that she was fast asleep and she woke up with the sensation that her heart was beating really fast out of her chest.  She took ASA without improvement and so decided to come in to the ER.  No h/o prior.  She has a stent that was placed in 2016.  She felt well last night without issues.  Currently, she feels much better, on Cardizem drip.   ED Course: Carryover, per Dr. Selena Harrison:  63 yo female with hypertension, hyperlipidemia, CAD s/p stent presents with chest pain/ sob, palpitations/ dizziness and found in afib with RVR.    TSH pending Trop 11 EKG Afib at 160, nl axis  Currently on cardizem GTT  ED requesting admission for Afib with RVR.    Review of Systems: As per HPI; otherwise review of systems reviewed and negative.   Ambulatory Status:  Ambulates without assistance  Past Medical History:  Diagnosis Date  . CAD (coronary artery disease)    a. Cardiac CTA: severe 3VD;  b. 05/2014 Cath/PCI: LM nl, LAD 50-60p/m, 97m, D1 50ost/m, RI small/plaque, LCX dominant 20-30p, 58m, 60/90d (2.25x24 Promus DES), OM2 40-65m, RCA nondom, mod diff plaque, EF 55%.  Marland Kitchen Hyperlipidemia   . Hypertension   . Pulmonary nodules    a. 05/2014 CTA: left lung pulm nodules w/ rec for 3 month f/u noncontrast CT.  . Tobacco abuse     Past Surgical History:  Procedure Laterality Date  . CESAREAN SECTION    . LEFT HEART CATHETERIZATION WITH CORONARY ANGIOGRAM N/A 06/16/2014   Procedure: LEFT HEART CATHETERIZATION WITH CORONARY ANGIOGRAM;  Surgeon: Kari Patty,  MD;  Location: [facility] CATH LAB;  Service: Cardiovascular;  Laterality: N/A;    Social History   Socioeconomic History  . Marital status: Married    Spouse name: Not on file  . Number of children: Not on file  . Years of education: Not on file  . Highest education level: Not on file  Occupational History  . Occupation: Agricultural consultant  Social Needs  . Financial resource strain: Not on file  . Food insecurity    Worry: Not on file    Inability: Not on file  . Transportation needs    Medical: Not on file    Non-medical: Not on file  Tobacco Use  . Smoking status: Current Every Day Smoker    Packs/day: 0.50    Years: 24.00    Pack years: 12.00    Types: Cigarettes  . Smokeless tobacco: Never Used  . Tobacco comment: denies need for a patch  Substance and Sexual Activity  . Alcohol use: No    Alcohol/week: 0.0 standard drinks  . Drug use: Yes    Types: Marijuana    Comment: occasional use  . Sexual activity: Not on file  Lifestyle  . Physical activity    Days per week: Not on file    Minutes per session: Not on file  . Stress: Not on file  Relationships  . Social Musician on phone: Not on file    Gets together: Not  on file    Attends religious service: Not on file    Active member of club or organization: Not on file    Attends meetings of clubs or organizations: Not on file    Relationship status: Not on file  . Intimate partner violence    Fear of current or ex partner: Not on file    Emotionally abused: Not on file    Physically abused: Not on file    Forced sexual activity: Not on file  Other Topics Concern  . Not on file  Social History Narrative  . Not on file    No Known Allergies  Family History  Problem Relation Age of Onset  . Arthritis Mother   . Macular degeneration Mother   . Heart attack Father   . Hypertension Sister   . Atrial fibrillation Neg Hx     Prior to Admission medications   Medication Sig Start Date End Date Taking?  Authorizing Provider  aspirin 81 MG tablet Take 1 tablet (81 mg total) by mouth daily. 06/17/14   Theora Gianotti, NP  atenolol (TENORMIN) 50 MG tablet Take 50 mg by mouth daily. 06/12/14   [provider]  atorvastatin (LIPITOR) 80 MG tablet take 1 tablet by mouth once daily ; MAKE DOCTOR'S APPOINTMENT ! 01/28/16   Larey Dresser, MD  lisinopril-hydrochlorothiazide (PRINZIDE,ZESTORETIC) 20-12.5 MG per tablet Take 2 tablets by mouth daily. 06/12/14   [provider]  nitroGLYCERIN (NITROSTAT) 0.4 MG SL tablet Place 1 tablet (0.4 mg total) under the tongue every 5 (five) minutes x 3 doses as needed for chest pain. 06/17/14   Theora Gianotti, NP  venlafaxine XR (EFFEXOR-XR) 75 MG 24 hr capsule Take 75 mg by mouth daily. 06/12/14   [provider]    Physical Exam: Vitals:   02/20/19 0845 02/20/19 1014 02/20/19 1015 02/20/19 1016  BP:  109/76  105/78  Pulse: 94 (!) 53 86 78  Resp: 15 20 19 18   Temp:      TempSrc:      SpO2: 95% 91% 95% 95%  Weight:      Height:         . General:  Appears calm and comfortable and is NAD . Eyes:  PERRL, EOMI, normal lids, iris . ENT:  grossly normal hearing, lips & tongue, mmm . Neck:  no LAD, masses or thyromegaly . Cardiovascular:  Irregularly irregular but rate controlled, no m/r/g. No LE edema.  Marland Kitchen Respiratory:   CTA bilaterally with no wheezes/rales/rhonchi.  Normal respiratory effort. . Abdomen:  soft, NT, ND, NABS . Back:   normal alignment, no CVAT . Skin:  no rash or induration seen on limited exam . Musculoskeletal:  grossly normal tone BUE/BLE, good ROM, no bony abnormality . Psychiatric:  grossly normal mood and affect, speech fluent and appropriate, AOx3 . Neurologic:  CN 2-12 grossly intact, moves all extremities in coordinated fashion, sensation intact    Radiological Exams on Admission: Dg Chest 2 View  Result Date: 02/20/2019 CLINICAL DATA:  Tachycardia. EXAM: CHEST - 2 VIEW COMPARISON:   Radiographs 06/14/2014. CT 09/22/2014 FINDINGS: The cardiomediastinal contours are unchanged. Normal heart size with unchanged aortic tortuosity. Chronic bronchial thickening. Pulmonary vasculature is normal. No consolidation, pleural effusion, or pneumothorax. No acute osseous abnormalities are seen. IMPRESSION: Chronic bronchial thickening. No acute abnormality. Electronically Signed   By: Keith Rake M.D.   On: 02/20/2019 05:13    EKG: Independently reviewed.  Afib with RVR with rate 163;  nonspecific ST changes which are likely rate-related   Labs on Admission: I have personally reviewed the available labs and imaging studies at the time of the admission.  Pertinent labs:   Glucose 164 HS troponin 11, 15 WBC 12.5 Hgb 17.0 TSH 4.492   Assessment/Plan Principal Problem:   Atrial fibrillation with RVR (HCC) Active Problems:   Tobacco abuse   CAD in native artery   Hypertension   Hyperlipidemia   Marijuana abuse    Atrial Fibrillation with RVR -Patient presenting with new-onset afib.  -Etiology is thought to be related to HTN, ischemic heart disease, or marijuana abuse, but is currently unknown.  -Since the afib onset was within 48 hours, cardioversion could be considered. -Will plan to admit to SDU for Diltiazem drip as per protocol with plan to transition to PO Diltiazem once heart rate is controlled.  -HS troponin negative; will not repeat due to low suspicion for current ACS. -Will give ASA 81 mg PO daily.   -Will request Echocardiogram for further evaluation  -Will risk stratify with FLP and HgbA1c; will also order TSH and UDS.  -Repeat EKG in AM  -Will consult cardiology  -Heart rate is well controlled on Cardizem at this time. -CHA2DS2-VASc Score is >2 and so patient will need oral anticoagulation.  -Patient started on Eliquis.   HTN -Takes Atenolol and Lisinopril-HCTZ at home; will hold for now given Cardizem drip with current BP 105/78.  HLD -Continue  Lipitor 80mg  -Lipids were checked in 2016, will repeat  Hyperglycemia -Glucose 164 -No known h/o DM, will check A1c and cover with moderate-scale SSI for now  CAD -Continue daily ASA, statin -Likely needs resumption of BB and ACE when tolerated  Tobacco dependence -Tobacco Dependence: encourage cessation.   -This was discussed with the patient and should be reviewed on an ongoing basis.   -Patch declined by patient and she reports plan to quit.  Marijuana abuse -Cessation encouraged; this should be encouraged on an ongoing basis -UDS ordered    Note: This patient has been tested and is negative for the novel coronavirus COVID-19.   DVT prophylaxis:  Eliquis  Code Status:  Full - confirmed with patient Family Communication: None present Disposition Plan:  Home once clinically improved Consults called: Cardiology  Admission status:   Admit - It is my clinical opinion that admission to INPATIENT is reasonable and necessary because of the expectation that this patient will require hospital care that crosses at least 2 midnights to treat this condition based on the medical complexity of the problems presented.  Given the aforementioned information, the predictability of an adverse outcome is felt to be significant.    Jonah BlueJennifer Nemesio Castrillon MD [facility]   How to contact the [facility] Attending or Consulting provider 7A - 7P or covering provider during after hours 7P -7A, for this patient?  1. Check the care team in [facility] and look for a) attending/consulting [facility] provider listed and b) the [facility] team listed 2. Log into [email or web address] and use [place]'s universal password to access. If you do not have the password, please contact the hospital operator. 3. Locate the [facility] provider you are looking for under [facility] and page to a number that you can be directly reached. 4. If you still have difficulty reaching the provider, please page the [facility] (Director on Call) for the Hospitalists  listed on amion for assistance.   02/20/2019, 10:54 AM

## 2019-02-20 NOTE — Consult Note (Signed)
Cardiology Consultation:   Patient ID: Kari Harrison MRN: [id number]; DOB: [birth year]  Admit date: 02/20/2019 Date of Consult: 02/20/2019  Primary Care Provider: Sigmund HazelMiller, Lisa, MD Primary Cardiologist:  Juleen ChinaMcLean r  Primary Electrophysiologist:  None    Patient Profile:   Kari Harrison is a 63 y.o. female with a hx of hypertension, hyperlipidemia, coronary artery disease, cigarette smoking who is being seen today for the evaluation of new onset atrial fibrillation at the request of Dr. Ophelia CharterYates.  History of Present Illness:   Kari Harrison is a 63 year old female with a history of hypertension, coronary artery disease and is status post PCI.  She woke up with tachycardia palpitations.  She took an aspirin without improvement and came to the emergency room.  She was found to have atrial fibrillation at a rate of 160. She has a history of coronary stenting in the past.  She denies any episodes of chest pain that are consistent with angina. Has a history of hyperlipidemia as well and has been on atorvastatin 80 mg a day.  Heart Pathway Score:     Past Medical History:  Diagnosis Date  . CAD (coronary artery disease)    a. Cardiac CTA: severe 3VD;  b. 05/2014 Cath/PCI: LM nl, LAD 50-60p/m, 989m, D1 50ost/m, RI small/plaque, LCX dominant 20-30p, 3070m, 60/90d (2.25x24 Promus DES), OM2 40-5470m, RCA nondom, mod diff plaque, EF 55%.  Marland Kitchen. Hyperlipidemia   . Hypertension   . Pulmonary nodules    a. 05/2014 CTA: left lung pulm nodules w/ rec for 3 month f/u noncontrast CT.  . Tobacco abuse     Past Surgical History:  Procedure Laterality Date  . CESAREAN SECTION    . LEFT HEART CATHETERIZATION WITH CORONARY ANGIOGRAM N/A 06/16/2014   Procedure: LEFT HEART CATHETERIZATION WITH CORONARY ANGIOGRAM;  Surgeon: Dolores Pattyaniel R Bensimhon, MD;  [facility];  Service: Cardiovascular;  Laterality: N/A;     Home Medications:  Prior to Admission medications   Medication Sig Start Date End Date  Taking? Authorizing Provider  Ascorbic Acid (VITAMIN C) 100 MG tablet Take 100 mg by mouth daily.   Yes [provider]  aspirin 81 MG tablet Take 1 tablet (81 mg total) by mouth daily. 06/17/14  Yes Creig HinesBerge, Christopher Ronald, NP  atenolol (TENORMIN) 50 MG tablet Take 50 mg by mouth daily. 06/12/14  Yes [provider]  atorvastatin (LIPITOR) 80 MG tablet take 1 tablet by mouth once daily ; MAKE DOCTOR'S APPOINTMENT ! Patient taking differently: Take 80 mg by mouth daily.  01/28/16  Yes Laurey MoraleMcLean, Dalton S, MD  CALCIUM PO Take 1 tablet by mouth daily.   Yes [provider]  cholecalciferol (VITAMIN D3) 25 MCG (1000 UT) tablet Take 1,000 Units by mouth daily.   Yes [provider]  lisinopril-hydrochlorothiazide (PRINZIDE,ZESTORETIC) 20-12.5 MG per tablet Take 2 tablets by mouth daily. 06/12/14  Yes [provider]  loratadine (CLARITIN) 10 MG tablet Take 10 mg by mouth daily.   Yes [provider]  Multiple Vitamins-Minerals (ZINC PO) Take 1 tablet by mouth daily.   Yes [provider]  nitroGLYCERIN (NITROSTAT) 0.4 MG SL tablet Place 1 tablet (0.4 mg total) under the tongue every 5 (five) minutes x 3 doses as needed for chest pain. 06/17/14  Yes Creig HinesBerge, Christopher Ronald, NP  omeprazole (PRILOSEC) 20 MG capsule Take 20 mg by mouth daily.   Yes [provider]  venlafaxine XR (EFFEXOR-XR) 75 MG 24 hr capsule Take 75 mg by mouth  daily. 06/12/14  Yes [provider]    Inpatient Medications: Scheduled Meds: . apixaban  5 mg Oral BID  . [START ON 02/21/2019] aspirin EC  81 mg Oral Daily  . [START ON 02/21/2019] atorvastatin  80 mg Oral q1800  . insulin aspart  0-15 Units Subcutaneous TID WC  . insulin aspart  0-5 Units Subcutaneous QHS  . [START ON 02/21/2019] loratadine  10 mg Oral Daily  . [START ON 02/21/2019] pantoprazole  40 mg Oral Daily  . venlafaxine XR  75 mg Oral Daily   Continuous Infusions: . diltiazem  (CARDIZEM) infusion 5 mg/hr (02/20/19 1016)   PRN Meds: acetaminophen, ondansetron (ZOFRAN) IV  Allergies:   No Known Allergies  Social History:   Social History   Socioeconomic History  . Marital status: Married    Spouse name: Not on file  . Number of children: Not on file  . Years of education: Not on file  . Highest education level: Not on file  Occupational History  . Occupation: Chartered certified accountant  Social Needs  . Financial resource strain: Not on file  . Food insecurity    Worry: Not on file    Inability: Not on file  . Transportation needs    Medical: Not on file    Non-medical: Not on file  Tobacco Use  . Smoking status: Current Every Day Smoker    Packs/day: 0.50    Years: 24.00    Pack years: 12.00    Types: Cigarettes  . Smokeless tobacco: Never Used  . Tobacco comment: denies need for a patch  Substance and Sexual Activity  . Alcohol use: No    Alcohol/week: 0.0 standard drinks  . Drug use: Yes    Types: Marijuana    Comment: occasional use  . Sexual activity: Not on file  Lifestyle  . Physical activity    Days per week: Not on file    Minutes per session: Not on file  . Stress: Not on file  Relationships  . Social Herbalist on phone: Not on file    Gets together: Not on file    Attends religious service: Not on file    Active member of club or organization: Not on file    Attends meetings of clubs or organizations: Not on file    Relationship status: Not on file  . Intimate partner violence    Fear of current or ex partner: Not on file    Emotionally abused: Not on file    Physically abused: Not on file    Forced sexual activity: Not on file  Other Topics Concern  . Not on file  Social History Narrative  . Not on file    Family History:    Family History  Problem Relation Age of Onset  . Arthritis Mother   . Macular degeneration Mother   . Heart attack Father   . Hypertension Sister   . Atrial fibrillation Neg Hx       ROS:  Please see the history of present illness.   All other ROS reviewed and negative.     Physical Exam/Data:   Vitals:   02/20/19 0845 02/20/19 1014 02/20/19 1015 02/20/19 1016  BP:  109/76  105/78  Pulse: 94 (!) 53 86 78  Resp: 15 20 19 18   Temp:      TempSrc:      SpO2: 95% 91% 95% 95%  Weight:      Height:  No intake or output data in the 24 hours ending 02/20/19 1219 Last 3 Weights 02/20/2019 10/27/2014 09/27/2014  Weight (lbs) 170 lb 168 lb 168 lb  Weight (kg) 77.111 kg 76.204 kg 76.204 kg     Body mass index is 31.09 kg/m.  General:  Well nourished, well developed, in no acute distress HEENT: normal Lymph: no adenopathy Neck: no JVD Endocrine:  No thryomegaly Vascular: No carotid bruits; FA pulses 2+ bilaterally without bruits  Cardiac:  normal S1, S2; RRR; no murmur  Lungs:  clear to auscultation bilaterally, no wheezing, rhonchi or rales  Abd: soft, nontender, no hepatomegaly  Ext: no edema Musculoskeletal:  No deformities, BUE and BLE strength normal and equal Skin: warm and dry  Neuro:  CNs 2-12 intact, no focal abnormalities noted Psych:  Normal affect   EKG:  The EKG was personally reviewed and demonstrates:   Atrial fibrillation with a rapid ventricular response in the 160s.  No ST or T wave changes. Telemetry:  Telemetry was personally reviewed and demonstrates: Currently she is in atrial fibrillation with a controlled ventricular rate in the 70s to 80s.  Relevant CV Studies:   Laboratory Data:  High Sensitivity Troponin:   Recent Labs  Lab 02/20/19 0447 02/20/19 0630  TROPONINIHS 11 15     Chemistry Recent Labs  Lab 02/20/19 0447  NA 141  K 3.5  CL 107  CO2 22  GLUCOSE 164*  BUN 15  CREATININE 0.84  CALCIUM 9.6  GFRNONAA >60  GFRAA >60  ANIONGAP 12    No results for input(s): PROT, ALBUMIN, AST, ALT, ALKPHOS, BILITOT in the last 168 hours. Hematology Recent Labs  Lab 02/20/19 0447  WBC 12.5*  RBC 5.43*  HGB 17.0*   HCT 50.8*  MCV 93.6  MCH 31.3  MCHC 33.5  RDW 13.2  PLT 334   BNPNo results for input(s): BNP, PROBNP in the last 168 hours.  DDimer No results for input(s): DDIMER in the last 168 hours.   Radiology/Studies:  Dg Chest 2 View  Result Date: 02/20/2019 CLINICAL DATA:  Tachycardia. EXAM: CHEST - 2 VIEW COMPARISON:  Radiographs 06/14/2014. CT 09/22/2014 FINDINGS: The cardiomediastinal contours are unchanged. Normal heart size with unchanged aortic tortuosity. Chronic bronchial thickening. Pulmonary vasculature is normal. No consolidation, pleural effusion, or pneumothorax. No acute osseous abnormalities are seen. IMPRESSION: Chronic bronchial thickening. No acute abnormality. Electronically Signed   By: Narda Rutherford M.D.   On: 02/20/2019 05:13    Assessment and Plan:   1. Atrial fibrillation with a rapid ventricular response: Patient is on a diltiazem drip.  Her heart rate is much better.  She is currently asymptomatic and does not have any episodes of chest pain.  She is diuresing quite a bit after slowing of her heart rate.  She has been started on diltiazem drip as well as Eliquis. The plan is to arrange for TEE/cardioversion in the next day or so. She may convert on her own.  Her rate has slowed nicely.  2.  Hyperlipidemia: Continue atorvastatin.  Lipid panel is to be drawn tomorrow.  3.  History of coronary artery disease: She is not having any episodes of angina.  None of her symptoms for this admission are consistent with angina.      For questions or updates, please contact [facility] Please consult [email or web address] for contact info under     Signed, Kristeen Miss, MD  02/20/2019 12:19 PM

## 2019-02-20 NOTE — Plan of Care (Signed)
63 yo female with hypertension, hyperlipidemia, CAD s/p stent presents with chest pain/ sob, palpitations/ dizziness and found in afib with RVR.    TSH pending Trop 11 EKG Afib at 160, nl axis  Currently on cardizem GTT  ED requesting admission for Afib with RVR.

## 2019-02-20 NOTE — Discharge Instructions (Signed)
Information on my medicine - ELIQUIS® (apixaban) ° °This medication education was reviewed with me or my healthcare representative as part of my discharge preparation. ° °Why was Eliquis® prescribed for you? °Eliquis® was prescribed for you to reduce the risk of a blood clot forming that can cause a stroke if you have a medical condition called atrial fibrillation (a type of irregular heartbeat). ° °What do You need to know about Eliquis® ? °Take your Eliquis® TWICE DAILY - one tablet in the morning and one tablet in the evening with or without food. If you have difficulty swallowing the tablet whole please discuss with your pharmacist how to take the medication safely. ° °Take Eliquis® exactly as prescribed by your doctor and DO NOT stop taking Eliquis® without talking to the doctor who prescribed the medication.  Stopping may increase your risk of developing a stroke.  Refill your prescription before you run out. ° °After discharge, you should have regular check-up appointments with your healthcare provider that is prescribing your Eliquis®.  In the future your dose may need to be changed if your kidney function or weight changes by a significant amount or as you get older. ° °What do you do if you miss a dose? °If you miss a dose, take it as soon as you remember on the same day and resume taking twice daily.  Do not take more than one dose of ELIQUIS at the same time to make up a missed dose. ° °Important Safety Information °A possible side effect of Eliquis® is bleeding. You should call your healthcare provider right away if you experience any of the following: °? Bleeding from an injury or your nose that does not stop. °? Unusual colored urine (red or dark [place]) or unusual colored stools (red or black). °? Unusual bruising for unknown reasons. °? A serious fall or if you hit your head (even if there is no bleeding). ° °Some medicines may interact with Eliquis® and might increase your risk of bleeding or  clotting while on Eliquis®. To help avoid this, consult your healthcare provider or pharmacist prior to using any new prescription or non-prescription medications, including herbals, vitamins, non-steroidal anti-inflammatory drugs (NSAIDs) and supplements. ° °This website has more information on Eliquis® (apixaban): http://www.eliquis.com/eliquis/home ° °

## 2019-02-20 NOTE — ED Notes (Signed)
ED TO INPATIENT HANDOFF REPORT  ED Nurse Name and Phone #:   S Name/Age/Gender Kari Harrison 63 y.o. female Room/Bed: 029C/029C  Code Status   Code Status: Full Code  Home/SNF/Other Home Patient oriented to: self, place, time and situation Is this baseline? Yes   Triage Complete: Triage complete  Chief Complaint high blood pressure and rapid heart beat  Triage Note Pt woke up this morning feeling like her heart rate was high; took her blood pressure and it was noted to be 190 systolic. Denies dizziness; reports sob at times; denies chest pain. Pulse noted to be irregular on palpation    Allergies No Known Allergies  Level of Care/Admitting Diagnosis ED Disposition    ED Disposition Condition Comment   Admit  Hospital Area: [facility] [[id number]]  Level of Care: Progressive [102]  Admit to Progressive based on following criteria: CARDIOVASCULAR & THORACIC of moderate stability with acute coronary syndrome symptoms/low risk myocardial infarction/hypertensive urgency/arrhythmias/heart failure potentially compromising stability and stable post cardiovascular intervention patients.  Covid Evaluation: Asymptomatic Screening Protocol (No Symptoms)  Diagnosis: Atrial fibrillation (HCC) [427.31.ICD-9-CM]  Admitting Physician: Jonah Blue [[id number]]  Attending Physician: Jonah Blue [[id number]]  Estimated length of stay: 3 - 4 days  Certification:: I certify this patient will need inpatient services for at least 2 midnights  PT Class (Do Not Modify): Inpatient [101]  PT Acc Code (Do Not Modify): Private [1]       B Medical/Surgery History Past Medical History:  Diagnosis Date  . CAD (coronary artery disease)    a. Cardiac CTA: severe 3VD;  b. 05/2014 Cath/PCI: LM nl, LAD 50-60p/m, 22m, D1 50ost/m, RI small/plaque, LCX dominant 20-30p, 7m, 60/90d (2.25x24 Promus DES), OM2 40-46m, RCA nondom, mod diff plaque, EF 55%.  Marland Kitchen Hyperlipidemia   . Hypertension   .  Pulmonary nodules    a. 05/2014 CTA: left lung pulm nodules w/ rec for 3 month f/u noncontrast CT.  . Tobacco abuse    Past Surgical History:  Procedure Laterality Date  . CESAREAN SECTION    . LEFT HEART CATHETERIZATION WITH CORONARY ANGIOGRAM N/A 06/16/2014   Procedure: LEFT HEART CATHETERIZATION WITH CORONARY ANGIOGRAM;  Surgeon: Dolores Patty, MD;  [facility] C[facility] LAB;  Service: Cardiovascular;  Laterality: N/A;     A IV Location/Drains/Wounds Patient Lines/Drains/Airways Status   Active Line/Drains/Airways    Name:   Placement date:   Placement time:   Site:   Days:   Peripheral IV 02/20/19 Left Antecubital   02/20/19    0448    Antecubital   less than 1   External Urinary Catheter   02/20/19    0612    -   less than 1          Intake/Output Last 24 hours No intake or output data in the 24 hours ending 02/20/19 1257  Labs/Imaging Results for orders placed or performed during the hospital encounter of 02/20/19 (from the past 48 hour(s))  Basic metabolic panel     Status: Abnormal   Collection Time: 02/20/19  4:47 AM  Result Value Ref Range   Sodium 141 135 - 145 mmol/L   Potassium 3.5 3.5 - 5.1 mmol/L   Chloride 107 98 - 111 mmol/L   CO2 22 22 - 32 mmol/L   Glucose, Bld 164 (H) 70 - 99 mg/dL   BUN 15 8 - 23 mg/dL   Creatinine, Ser 9.83 0.44 - 1.00 mg/dL   Calcium 9.6 8.9 - 10.3  mg/dL   GFR calc non Af Amer >60 >60 mL/min   GFR calc Af Amer >60 >60 mL/min   Anion gap 12 5 - 15    Comment: Performed at [facility] [address], [place], [place] [address]  CBC     Status: Abnormal   Collection Time: 02/20/19  4:47 AM  Result Value Ref Range   WBC 12.5 (H) 4.0 - 10.5 K/uL   RBC 5.43 (H) 3.87 - 5.11 MIL/uL   Hemoglobin 17.0 (H) 12.0 - 15.0 g/dL   HCT 50.8 (H) 36.0 - 46.0 %   MCV 93.6 80.0 - 100.0 fL   MCH 31.3 26.0 - 34.0 pg   MCHC 33.5 30.0 - 36.0 g/dL   RDW 13.2 11.5 - 15.5 %   Platelets 334 150 - 400 K/uL   nRBC 0.0 0.0 - 0.2 %    Comment:  Performed at [facility], [place] [address] [place], [place] [address]  Troponin I (High Sensitivity)     Status: None   Collection Time: 02/20/19  4:47 AM  Result Value Ref Range   Troponin I (High Sensitivity) 11 <18 ng/L    Comment: (NOTE) Elevated high sensitivity troponin I (hsTnI) values and significant  changes across serial measurements may suggest ACS but many other  chronic and acute conditions are known to elevate hsTnI results.  Refer to the "Links" section for chest pain algorithms and additional  guidance. Performed at [facility], [place] [address], [place], [place] [address]   Magnesium     Status: None   Collection Time: 02/20/19  4:47 AM  Result Value Ref Range   Magnesium 1.8 1.7 - 2.4 mg/dL    Comment: Performed at [facility] [address] [place], [place] [address]  TSH     Status: None   Collection Time: 02/20/19  6:07 AM  Result Value Ref Range   TSH 4.492 0.350 - 4.500 uIU/mL    Comment: Performed by a 3rd Generation assay with a functional sensitivity of <=0.01 uIU/mL. Performed at [facility], [place] [address], [place], [place] [address]   Troponin I (High Sensitivity)     Status: None   Collection Time: 02/20/19  6:30 AM  Result Value Ref Range   Troponin I (High Sensitivity) 15 <18 ng/L    Comment: (NOTE) Elevated high sensitivity troponin I (hsTnI) values and significant  changes across serial measurements may suggest ACS but many other  chronic and acute conditions are known to elevate hsTnI results.  Refer to the "Links" section for chest pain algorithms and additional  guidance. Performed at [facility], [place] [address], [place], [place] [address]    Dg Chest 2 View  Result Date: 02/20/2019 CLINICAL DATA:  Tachycardia. EXAM: CHEST - 2 VIEW COMPARISON:  Radiographs 06/14/2014. CT 09/22/2014 FINDINGS: The cardiomediastinal contours are unchanged. Normal heart size with unchanged aortic tortuosity. Chronic  bronchial thickening. Pulmonary vasculature is normal. No consolidation, pleural effusion, or pneumothorax. No acute osseous abnormalities are seen. IMPRESSION: Chronic bronchial thickening. No acute abnormality. Electronically Signed   By: Keith Rake M.D.   On: 02/20/2019 05:13    Pending Labs Unresulted Labs (From admission, onward)    Start     Ordered   02/21/19 7893  Basic metabolic panel  Tomorrow morning,   R     02/20/19 0843   02/21/19 0500  Lipid panel  Tomorrow morning,   R  02/20/19 0843   02/21/19 0500  CBC  Tomorrow morning,   R     02/20/19 0843   02/20/19 1058  Urine rapid drug screen (hosp performed)  ONCE - STAT,   STAT     02/20/19 1102   02/20/19 0841  HIV Antibody (routine testing w rflx)  (HIV Antibody (Routine testing w reflex) panel)  Once,   STAT     02/20/19 0843   02/20/19 0841  Hemoglobin A1c  Once,   STAT    Comments: To assess prior glycemic control    02/20/19 0843   02/20/19 0537  SARS CORONAVIRUS 2 (TAT 6-24 HRS) Nasopharyngeal Nasopharyngeal Swab  (Asymptomatic/Tier 3)  ONCE - STAT,   STAT    Question Answer Comment  Is this test for diagnosis or screening Screening   Symptomatic for COVID-19 as defined by CDC No   Hospitalized for COVID-19 No   Admitted to ICU for COVID-19 No   Previously tested for COVID-19 No   Resident in a congregate (group) care setting No   Employed in healthcare setting Unknown   Pregnant No      02/20/19 0536          Vitals/Pain Today's Vitals   02/20/19 0845 02/20/19 1014 02/20/19 1015 02/20/19 1016  BP:  109/76  105/78  Pulse: 94 (!) 53 86 78  Resp: 15 20 19 18   Temp:      TempSrc:      SpO2: 95% 91% 95% 95%  Weight:      Height:      PainSc:        Isolation Precautions No active isolations  Medications Medications  aspirin EC tablet 81 mg (has no administration in time range)  atorvastatin (LIPITOR) tablet 80 mg (has no administration in time range)  venlafaxine XR (EFFEXOR-XR) 24 hr  capsule 75 mg (has no administration in time range)  acetaminophen (TYLENOL) tablet 650 mg (has no administration in time range)  ondansetron (ZOFRAN) injection 4 mg (has no administration in time range)  insulin aspart (novoLOG) injection 0-15 Units (has no administration in time range)  apixaban (ELIQUIS) tablet 5 mg (5 mg Oral Given 02/20/19 1150)  diltiazem (CARDIZEM) 125 mg in dextrose 5% 125 mL (1 mg/mL) infusion (5 mg/hr Intravenous New Bag/Given 02/20/19 1016)  insulin aspart (novoLOG) injection 0-5 Units (has no administration in time range)  loratadine (CLARITIN) tablet 10 mg (has no administration in time range)  pantoprazole (PROTONIX) EC tablet 40 mg (has no administration in time range)  diltiazem (CARDIZEM) 1 mg/mL load via infusion 10 mg (10 mg Intravenous Bolus from Bag 02/20/19 0629)  aspirin chewable tablet 324 mg (324 mg Oral Given 02/20/19 [id number])    Mobility walks Low fall risk   Focused Assessments Cardiac Assessment Handoff:  Cardiac Rhythm: Atrial fibrillation Lab Results  Component Value Date   TROPONINI <0.03 06/15/2014   No results found for: DDIMER Does the Patient currently have chest pain? No     R Recommendations: See Admitting Provider Note  Report given to:   Additional Notes:

## 2019-02-21 ENCOUNTER — Inpatient Hospital Stay (HOSPITAL_COMMUNITY): Payer: Commercial Managed Care - PPO

## 2019-02-21 ENCOUNTER — Other Ambulatory Visit: Payer: Self-pay | Admitting: Medical

## 2019-02-21 DIAGNOSIS — R29818 Other symptoms and signs involving the nervous system: Secondary | ICD-10-CM

## 2019-02-21 DIAGNOSIS — I10 Essential (primary) hypertension: Secondary | ICD-10-CM

## 2019-02-21 DIAGNOSIS — R400 Somnolence: Secondary | ICD-10-CM

## 2019-02-21 DIAGNOSIS — E7849 Other hyperlipidemia: Secondary | ICD-10-CM

## 2019-02-21 DIAGNOSIS — E7800 Pure hypercholesterolemia, unspecified: Secondary | ICD-10-CM

## 2019-02-21 DIAGNOSIS — I4891 Unspecified atrial fibrillation: Secondary | ICD-10-CM

## 2019-02-21 LAB — URINALYSIS, ROUTINE W REFLEX MICROSCOPIC
Bilirubin Urine: NEGATIVE
Glucose, UA: NEGATIVE mg/dL
Ketones, ur: NEGATIVE mg/dL
Nitrite: NEGATIVE
Protein, ur: NEGATIVE mg/dL
Specific Gravity, Urine: 1.023 (ref 1.005–1.030)
pH: 5 (ref 5.0–8.0)

## 2019-02-21 LAB — GLUCOSE, CAPILLARY
Glucose-Capillary: 104 mg/dL — ABNORMAL HIGH (ref 70–99)
Glucose-Capillary: 116 mg/dL — ABNORMAL HIGH (ref 70–99)
Glucose-Capillary: 96 mg/dL (ref 70–99)
Glucose-Capillary: 122 mg/dL — ABNORMAL HIGH (ref 70–99)
Glucose-Capillary: 90 mg/dL (ref 70–99)

## 2019-02-21 LAB — LIPID PANEL
Cholesterol: 134 mg/dL (ref 0–200)
HDL: 33 mg/dL — ABNORMAL LOW (ref >40)
LDL Cholesterol: 74 mg/dL (ref 0–99)
Total CHOL/HDL Ratio: 4.1 RATIO
Triglycerides: 135 mg/dL (ref <150)
VLDL: 27 mg/dL (ref 0–40)

## 2019-02-21 LAB — CBC
HCT: 45.9 % (ref 36.0–46.0)
Hemoglobin: 15.5 g/dL — ABNORMAL HIGH (ref 12.0–15.0)
MCH: 31.6 pg (ref 26.0–34.0)
MCHC: 33.8 g/dL (ref 30.0–36.0)
MCV: 93.7 fL (ref 80.0–100.0)
Platelets: 298 10*3/uL (ref 150–400)
RBC: 4.9 MIL/uL (ref 3.87–5.11)
RDW: 13.2 % (ref 11.5–15.5)
WBC: 11.9 10*3/uL — ABNORMAL HIGH (ref 4.0–10.5)
nRBC: 0 % (ref 0.0–0.2)

## 2019-02-21 LAB — BASIC METABOLIC PANEL
Anion gap: 7 (ref 5–15)
BUN: 12 mg/dL (ref 8–23)
CO2: 27 mmol/L (ref 22–32)
Calcium: 9 mg/dL (ref 8.9–10.3)
Chloride: 105 mmol/L (ref 98–111)
Creatinine, Ser: 0.84 mg/dL (ref 0.44–1.00)
GFR calc Af Amer: >60 mL/min (ref >60)
GFR calc non Af Amer: >60 mL/min (ref >60)
Glucose, Bld: 118 mg/dL — ABNORMAL HIGH (ref 70–99)
Potassium: 3.3 mmol/L — ABNORMAL LOW (ref 3.5–5.1)
Sodium: 139 mmol/L (ref 135–145)

## 2019-02-21 MED ORDER — LISINOPRIL-HYDROCHLOROTHIAZIDE 20-12.5 MG PO TABS: 2.0000 | ORAL_TABLET | Freq: Every day | ORAL | Status: DC

## 2019-02-21 MED ORDER — ATENOLOL 50 MG PO TABS
50.0000 mg | ORAL_TABLET | Freq: Every day | ORAL | Status: DC
  Administered 2019-02-21 – 2019-02-22 (×2): 50 mg via ORAL
  Filled 2019-02-21 (×2): qty 1

## 2019-02-21 MED ORDER — KETOROLAC TROMETHAMINE 30 MG/ML IJ SOLN
30.0000 mg | Freq: Once | INTRAMUSCULAR | Status: AC
  Administered 2019-02-21: 30 mg via INTRAVENOUS
  Filled 2019-02-21: qty 1

## 2019-02-21 MED ORDER — POTASSIUM CHLORIDE CRYS ER 20 MEQ PO TBCR
60.0000 meq | EXTENDED_RELEASE_TABLET | Freq: Once | ORAL | Status: AC
  Administered 2019-02-21: 60 meq via ORAL
  Filled 2019-02-21: qty 6

## 2019-02-21 MED ORDER — HYDROCHLOROTHIAZIDE 25 MG PO TABS
25.0000 mg | ORAL_TABLET | Freq: Every day | ORAL | Status: DC
  Administered 2019-02-21 – 2019-02-22 (×2): 25 mg via ORAL
  Filled 2019-02-21 (×2): qty 1

## 2019-02-21 MED ORDER — LISINOPRIL 40 MG PO TABS
40.0000 mg | ORAL_TABLET | Freq: Every day | ORAL | Status: DC
  Administered 2019-02-21 – 2019-02-22 (×2): 40 mg via ORAL
  Filled 2019-02-21 (×2): qty 1

## 2019-02-21 NOTE — Progress Notes (Signed)
   02/21/19 0506  Vitals  Temp 97.8 F (36.6 C)  Temp Source Oral  BP 125/68  MAP (mmHg) 83  BP Location Right Arm  BP Method Automatic  Patient Position (if appropriate) Lying  Pulse Rate (!) 54  Pulse Rate Source Monitor  Resp 16  Oxygen Therapy  SpO2 91 %  O2 Device Room Air  Height and Weight  Weight 78.8 kg (scale c)  Type of Scale Used Standing  BMI (Calculated) 31.76  MEWS Score  MEWS RR 0  MEWS Pulse 0  MEWS Systolic 0  MEWS LOC 0  MEWS Temp 0  MEWS Score 0  MEWS Score Color Green  Pt on cardizem drip, Dr. Kalman Shan made aware, awaiting call back.

## 2019-02-21 NOTE — Progress Notes (Signed)
PROGRESS NOTE    Kari Harrison  ZOX:[id number] DOB: 03/16/1956 DOA: 02/20/2019 PCP: Sigmund HazelMiller, Lisa, MD    Brief Narrative:  63 y.o. female with medical history significant of HTN; HLD: CAD s/p stent; and tobacco dependence presenting with palpitations.  She reports that she was fast asleep and she woke up with the sensation that her heart was beating really fast out of her chest.  She took ASA without improvement and so decided to come in to the ER.  No h/o prior.  She has a stent that was placed in 2016.  She felt well last night without issues.    Assessment & Plan:   Principal Problem:   Atrial fibrillation with RVR (HCC) Active Problems:   Tobacco abuse   CAD in native artery   Hypertension   Hyperlipidemia   Marijuana abuse   Atrial Fibrillation with RVR -Patient presenting with new-onset afib.  -Etiology is thought to be related to HTN, ischemic heart disease, or marijuana abuse, however is unknown at this time -Initially started on cardizem gtt, transitioned to PO, now back in NSR -On ASA 81 mg PO daily.   -2d echo reviewed. Normal LVEF -Seen by Cardiology. Initially plan for cardiversion, however pt now in NSR -Plan for follow up with afib clinic and with Cardiology -CHA2DS2-VASc Score is >2 and so patient started on Eliquis.   HTN -Takes Atenolol and Lisinopril-HCTZ at home -Resumed atenolol -Lisinopril was held secondary to soft BP while on cardizem gtt, will resume as cardizem is off  HLD -Continue Lipitor 80mg  -LDL 74  Hyperglycemia -Glucose 164 -No known h/o DM, will check A1c and cover with moderate-scale SSI for now  CAD -Continue daily ASA, statin -Will resume atenolol and ACEI per home regimen  Tobacco dependence -Tobacco Dependence: encourage cessation.   -This was discussed with the patient and should be reviewed on an ongoing basis.   -Patch declined by patient and she reports plan to quit at time of presentation  Marijuana abuse  -Cessation encouraged; this should be encouraged on an ongoing basis -UDS ordered and reviewed, confirms marijuana  Acute toxic metabolic encephalopathy -New finding today. Pt also reports increased generalized malaise and headache -Afebrile, however pt with leukocytosis per below -Will f/u below infectious work up  Leukocytosis -WBC 12.5k with repeat of 11.9k -Pt noted to be encephalopathic  -CXR reviewd. CXR appears unremarkable -Will check UA, urine culture and blood culture  DVT prophylaxis: Eliquis Code Status: Full Family Communication: Pt in room, family not at bedside Disposition Plan: Uncertain at this time  Consultants:   Cardiology  Procedures:     Antimicrobials: Anti-infectives (From admission, onward)   None       Subjective: Complaining of generalized malaise and headache this AM  Objective: Vitals:   02/21/19 1143 02/21/19 1146 02/21/19 1156 02/21/19 1614  BP: 134/86  127/87 (!) 148/87  Pulse:  75 69 (!) 59  Resp:   (!) 22 (!) 8  Temp:   98.2 F (36.8 C) 98.4 F (36.9 C)  TempSrc:   Oral Oral  SpO2:   99% 98%  Weight:      Height:        Intake/Output Summary (Last 24 hours) at 02/21/2019 1625 Last data filed at 02/21/2019 1258 Gross per 24 hour  Intake 772.9 ml  Output 300 ml  Net 472.9 ml   Filed Weights   02/20/19 0631 02/21/19 0506  Weight: 77.1 kg 78.8 kg    Examination:  General exam: Appears  calm and comfortable  Respiratory system: Clear to auscultation. Respiratory effort normal. Cardiovascular system: S1 & S2 heard, regular Gastrointestinal system: Abdomen is nondistended, soft and nontender. No organomegaly or masses felt. Normal bowel sounds heard. Central nervous system: Alert and oriented. No focal neurological deficits. Extremities: Symmetric 5 x 5 power. Skin: No rashes, lesions Psychiatry: Judgement and insight appear normal. Mood & affect appropriate.   Data Reviewed: I have personally reviewed following  labs and imaging studies  CBC: Recent Labs  Lab 02/20/19 0447 02/21/19 0530  WBC 12.5* 11.9*  HGB 17.0* 15.5*  HCT 50.8* 45.9  MCV 93.6 93.7  PLT 334 409   Basic Metabolic Panel: Recent Labs  Lab 02/20/19 0447 02/21/19 0530  NA 141 139  K 3.5 3.3*  CL 107 105  CO2 22 27  GLUCOSE 164* 118*  BUN 15 12  CREATININE 0.84 0.84  CALCIUM 9.6 9.0  MG 1.8  --    GFR: Estimated Creatinine Clearance: 65.8 mL/min (by C-G formula based on SCr of 0.84 mg/dL). Liver Function Tests: No results for input(s): AST, ALT, ALKPHOS, BILITOT, PROT, ALBUMIN in the last 168 hours. No results for input(s): LIPASE, AMYLASE in the last 168 hours. No results for input(s): AMMONIA in the last 168 hours. Coagulation Profile: No results for input(s): INR, PROTIME in the last 168 hours. Cardiac Enzymes: No results for input(s): CKTOTAL, CKMB, CKMBINDEX, TROPONINI in the last 168 hours. BNP (last 3 results) No results for input(s): PROBNP in the last 8760 hours. HbA1C: Recent Labs    02/20/19 1456  HGBA1C 6.1*   CBG: Recent Labs  Lab 02/20/19 1633 02/20/19 2121 02/21/19 0609 02/21/19 0811 02/21/19 1119  GLUCAP 115* 118* 104* 116* 96   Lipid Profile: Recent Labs    02/21/19 0530  CHOL 134  HDL 33*  LDLCALC 74  TRIG 135  CHOLHDL 4.1   Thyroid Function Tests: Recent Labs    02/20/19 0607  TSH 4.492   Anemia Panel: No results for input(s): VITAMINB12, FOLATE, FERRITIN, TIBC, IRON, RETICCTPCT in the last 72 hours. Sepsis Labs: No results for input(s): PROCALCITON, LATICACIDVEN in the last 168 hours.  Recent Results (from the past 240 hour(s))  SARS CORONAVIRUS 2 (TAT 6-24 HRS) Nasopharyngeal Nasopharyngeal Swab     Status: None   Collection Time: 02/20/19  5:37 AM   Specimen: Nasopharyngeal Swab  Result Value Ref Range Status   SARS Coronavirus 2 NEGATIVE NEGATIVE Final    Comment: (NOTE) SARS-CoV-2 target nucleic acids are NOT DETECTED. The SARS-CoV-2 RNA is generally  detectable in upper and lower respiratory specimens during the acute phase of infection. Negative results do not preclude SARS-CoV-2 infection, do not rule out co-infections with other pathogens, and should not be used as the sole basis for treatment or other patient management decisions. Negative results must be combined with clinical observations, patient history, and epidemiological information. The expected result is Negative. Fact Sheet for Patients: SugarRoll.be Fact Sheet for Healthcare Providers: https://www.woods-mathews.com/ This test is not yet approved or cleared by the [place] FDA and  has been authorized for detection and/or diagnosis of SARS-CoV-2 by FDA under an Emergency Use Authorization (EUA). This EUA will remain  in effect (meaning this test can be used) for the duration of the COVID-19 declaration under Section 56 4(b)(1) of the Act, 21 U.S.C. section 360bbb-3(b)(1), unless the authorization is terminated or revoked sooner. Performed at [facility], [place] [address] [place], [place] [address]      Radiology Studies:  Dg Chest 2 View  Result Date: 02/20/2019 CLINICAL DATA:  Tachycardia. EXAM: CHEST - 2 VIEW COMPARISON:  Radiographs 06/14/2014. CT 09/22/2014 FINDINGS: The cardiomediastinal contours are unchanged. Normal heart size with unchanged aortic tortuosity. Chronic bronchial thickening. Pulmonary vasculature is normal. No consolidation, pleural effusion, or pneumothorax. No acute osseous abnormalities are seen. IMPRESSION: Chronic bronchial thickening. No acute abnormality. Electronically Signed   By: Narda Rutherford M.D.   On: 02/20/2019 05:13    Scheduled Meds: . apixaban  5 mg Oral BID  . aspirin EC  81 mg Oral Daily  . atenolol  50 mg Oral Daily  . atorvastatin  80 mg Oral q1800  . lisinopril  40 mg Oral Daily   And  . hydrochlorothiazide  25 mg Oral Daily  . insulin aspart  0-15 Units  Subcutaneous TID WC  . insulin aspart  0-5 Units Subcutaneous QHS  . loratadine  10 mg Oral Daily  . pantoprazole  40 mg Oral Daily  . venlafaxine XR  75 mg Oral Daily   Continuous Infusions:   LOS: 1 day   Rickey Barbara, MD [facility] Pager On Amion  If 7PM-7AM, please contact night-coverage 02/21/2019, 4:25 PM

## 2019-02-21 NOTE — Progress Notes (Signed)
RN to bedside at 1608 to assess VS. Patient's husband stated patient seems different. Stated patient not able to recognize him nor recall his name upon arrival to room. Patient also can not remember passcode to phone. Husband Coralyn Mark stated patient reported unable to remember how to answer phone, feels confused and just doesn't feel like self.   RN conducted neuro assessment; WDL (pupils, symmetrical face, no drift arms, upper ext motor strength 5/5, able to stand and well balanced, able to follow commands, able to answer day of week, holiday this week, husband name and DOB). However, patient appears lethargic.   Paged Chiu at [phone]. please callback. pt. appears confused, BP 148/87, husband at bedside stated appears disoriented,memory impaired  Spoke with Wyline Copas upon callback informed of BP and that husband reported changes to memory/recall. Will place orders to assess for infection. VS assessed.   Patient husband at bedside expressed concern about meds patient received that may be contributing to altered mental status. Informed husband patient received lisinopril and hydrochlorothiazide PTA and patient communicated having HA before receiving Atenolol this AM. Patient also reported HA while on Cardizem gtt this AM. Informed receiving insulin to cover CBG, elevated cbg could be stress and does not mean patient will necessarily leave hospital on insulin. Informed will do UA, blood cult and CXR to r/o infection per MD orders; informed patient has mildly elevated WBC and some patients appear altered if have infection. Reviewed all PTA meds with patient and husband, stated takes four prescripiton meds atenolol, lipitor, hctz/lisinopril and one other could not recall. Patient offered to check at home for prescriptions and assured husband he can call back and wil inform pharm and MD of additional meds if necessary.  Reviewed prescriptions husband had for atenolol, effexor, lisinopril/hctz and lipitor and confirmed  similar inpatient dosing.

## 2019-02-21 NOTE — Progress Notes (Signed)
Spoke with tele and informed to notify of HR below 50 even if not sustained.   At approx 1310 patient stated takes lisinopril, atorvastatin and 2 other home meds and feels poorly when not taking them. Has not had HA such as current HA and current HA has some dizziness. Reviewed PTA meds with patient, will inform MD about concerns about med causing lousy feeling.  Paged MD Wyline Copas at [phone], Patient still complaining of 10/10 frontal HA.stated feels poorly when not take home meds; lisinopril? Spoke with Wyline Copas via phone at 1450, informed patient stated feels poorly without scheduled meds, however, unclear which one possibly lisinopril, however, this HA is new onset. Will order lisinopril and continue to monitor.

## 2019-02-21 NOTE — Progress Notes (Signed)
Progress Note  Patient Name: Kari Harrison Date of Encounter: 02/21/2019  Primary Cardiologist: Kristeen Miss, MD   Subjective   She reports having a frontal headache this morning. No complaints of chest pain, palpitations, or SOB.   Inpatient Medications    Scheduled Meds: . apixaban  5 mg Oral BID  . aspirin EC  81 mg Oral Daily  . atorvastatin  80 mg Oral q1800  . insulin aspart  0-15 Units Subcutaneous TID WC  . insulin aspart  0-5 Units Subcutaneous QHS  . loratadine  10 mg Oral Daily  . pantoprazole  40 mg Oral Daily  . potassium chloride  60 mEq Oral Once  . venlafaxine XR  75 mg Oral Daily   Continuous Infusions: . diltiazem (CARDIZEM) infusion Stopped (02/21/19 0938)   PRN Meds: acetaminophen, ondansetron (ZOFRAN) IV   Vital Signs    Vitals:   02/20/19 2033 02/21/19 0020 02/21/19 0506 02/21/19 0814  BP:  103/75 125/68 129/79  Pulse:  (!) 53 (!) 54 (!) 57  Resp: (!) 21 16 16 18   Temp:  97.7 F (36.5 C) 97.8 F (36.6 C) 98.7 F (37.1 C)  TempSrc:  Oral Oral Oral  SpO2:  92% 91% 98%  Weight:   78.8 kg   Height:        Intake/Output Summary (Last 24 hours) at 02/21/2019 0943 Last data filed at 02/21/2019 0934 Gross per 24 hour  Intake 819.07 ml  Output 300 ml  Net 519.07 ml   Filed Weights   02/20/19 0631 02/21/19 0506  Weight: 77.1 kg 78.8 kg    Telemetry    Converted to sinus rhythm/sinus bradycardia with occasional PACs - Personally Reviewed  ECG    No new tracings - Personally Reviewed  Physical Exam   GEN: Laying in bed in no acute distress.   Neck: No JVD, no carotid bruits Cardiac: RRR, no murmurs, rubs, or gallops.  Respiratory: Clear to auscultation bilaterally, no wheezes/ rales/ rhonchi GI: NABS, Soft, nontender, non-distended  MS: No edema; No deformity. Neuro:  Nonfocal, moving all extremities spontaneously Psych: Normal affect   Labs    Chemistry Recent Labs  Lab 02/20/19 0447 02/21/19 0530  NA 141 139   K 3.5 3.3*  CL 107 105  CO2 22 27  GLUCOSE 164* 118*  BUN 15 12  CREATININE 0.84 0.84  CALCIUM 9.6 9.0  GFRNONAA >60 >60  GFRAA >60 >60  ANIONGAP 12 7     Hematology Recent Labs  Lab 02/20/19 0447 02/21/19 0530  WBC 12.5* 11.9*  RBC 5.43* 4.90  HGB 17.0* 15.5*  HCT 50.8* 45.9  MCV 93.6 93.7  MCH 31.3 31.6  MCHC 33.5 33.8  RDW 13.2 13.2  PLT 334 298    Cardiac EnzymesNo results for input(s): TROPONINI in the last 168 hours. No results for input(s): TROPIPOC in the last 168 hours.   BNPNo results for input(s): BNP, PROBNP in the last 168 hours.   DDimer No results for input(s): DDIMER in the last 168 hours.   Radiology    Dg Chest 2 View  Result Date: 02/20/2019 CLINICAL DATA:  Tachycardia. EXAM: CHEST - 2 VIEW COMPARISON:  Radiographs 06/14/2014. CT 09/22/2014 FINDINGS: The cardiomediastinal contours are unchanged. Normal heart size with unchanged aortic tortuosity. Chronic bronchial thickening. Pulmonary vasculature is normal. No consolidation, pleural effusion, or pneumothorax. No acute osseous abnormalities are seen. IMPRESSION: Chronic bronchial thickening. No acute abnormality. Electronically Signed   By: 09/24/2014.D.  On: 02/20/2019 05:13    Cardiac Studies    Echocardiogram 02/20/2019: 1. Left ventricular ejection fraction, by visual estimation, is 60 to 65%. The left ventricle has normal function. There is mildly increased left ventricular hypertrophy.  2. Left ventricular diastolic parameters are indeterminate.  3. Global right ventricle has normal systolic function.The right ventricular size is normal. No increase in right ventricular wall thickness.  4. Left atrial size was normal.  5. Right atrial size was normal.  6. Moderate aortic valve annular calcification.  7. The mitral valve is normal in structure. No evidence of mitral valve regurgitation. No evidence of mitral stenosis.  8. The tricuspid valve is normal in structure. Tricuspid valve  regurgitation is not demonstrated.  9. The aortic valve was not well visualized. Aortic valve regurgitation is not visualized. No evidence of aortic valve sclerosis or stenosis. 10. There is Moderate calcification of the aortic valve. 11. There is Moderate thickening of the aortic valve. 12. The pulmonic valve was not well visualized. Pulmonic valve regurgitation is not visualized. 13. The inferior vena cava is normal in size with greater than 50% respiratory variability, suggesting right atrial pressure of 3 mmHg.  Patient Profile     63 y.o. female with a hx of hypertension, hyperlipidemia, coronary artery disease s/p PCI/DES LCx, cigarette smoking who is being seen today for the evaluation of new onset atrial fibrillation   Assessment & Plan    1. New onset Atrial fibrillation with RVR: HR improved on diltiazem gtt. Home atenolol, HCTZ-lisinopril held to titrated diltiazem. She was started on apixaban 5mg  BID for stroke ppx given CHA2DS2-VASc Score of 3 (HTN, Vascular, Female). Echo yesterday with EF 60-65%, mild LVH, no significant valvular abnormalities, no RWMA. TSH wnl. Trops negative. No recent infections or decongestant use. She does reports snoring and daytime somnolence.  - Will stop diltiazem at this time - Transition to atenolol 50mg  daily with consideration for additional 25mg  qHS if HR elevated this  - Continue apixaban 5mg  BID - Will arrange an outpatient home sleep study to evaluate for OSA.   2. CAD s/p PCI/DES to LCx: troponins normal. No anginal complaints to suggest ischemia is playing a roll in #1.  - Continue aspirin, statin, Bblocker - Could consider an outpatient NST   3. HTN: BP stable. Home atenolol, Lisinopril-HCTZ held to titrate diltiazem gtt - Will restart home atenolol - Can restart home lisinopril-HCTZ at discharge  4. HLD: LDL 74; near goal of <74 - Continue atorvastatin 80mg  daily  5. Pre-DM type 2: A1C 6.1 - Continue dietary/lifestyle modifications  to promote weight loss  6. Tobacco abuse:  - Continue to encourage cessation  Will arrange follow-up in the afib clinic for within 1-2 week and with general cardiology in 1 month.      For questions or updates, please contact [place] Please consult [email or web address] for contact info under Cardiology/STEMI.      Signed, Abigail Butts, PA-C  02/21/2019, 9:43 AM   [phone]

## 2019-02-22 DIAGNOSIS — F1210 Cannabis abuse, uncomplicated: Secondary | ICD-10-CM

## 2019-02-22 LAB — CBC
HCT: 45.8 % (ref 36.0–46.0)
Hemoglobin: 15.5 g/dL — ABNORMAL HIGH (ref 12.0–15.0)
MCH: 31.3 pg (ref 26.0–34.0)
MCHC: 33.8 g/dL (ref 30.0–36.0)
MCV: 92.3 fL (ref 80.0–100.0)
Platelets: 297 10*3/uL (ref 150–400)
RBC: 4.96 MIL/uL (ref 3.87–5.11)
RDW: 13 % (ref 11.5–15.5)
WBC: 11.9 10*3/uL — ABNORMAL HIGH (ref 4.0–10.5)
nRBC: 0 % (ref 0.0–0.2)

## 2019-02-22 LAB — COMPREHENSIVE METABOLIC PANEL
ALT: 43 U/L (ref 0–44)
AST: 33 U/L (ref 15–41)
Albumin: 3.4 g/dL — ABNORMAL LOW (ref 3.5–5.0)
Alkaline Phosphatase: 105 U/L (ref 38–126)
Anion gap: 8 (ref 5–15)
BUN: 16 mg/dL (ref 8–23)
CO2: 27 mmol/L (ref 22–32)
Calcium: 9.5 mg/dL (ref 8.9–10.3)
Chloride: 105 mmol/L (ref 98–111)
Creatinine, Ser: 1.09 mg/dL — ABNORMAL HIGH (ref 0.44–1.00)
GFR calc Af Amer: >60 mL/min (ref >60)
GFR calc non Af Amer: 54 mL/min — ABNORMAL LOW (ref >60)
Glucose, Bld: 102 mg/dL — ABNORMAL HIGH (ref 70–99)
Potassium: 4.2 mmol/L (ref 3.5–5.1)
Sodium: 140 mmol/L (ref 135–145)
Total Bilirubin: 1.6 mg/dL — ABNORMAL HIGH (ref 0.3–1.2)
Total Protein: 6.6 g/dL (ref 6.5–8.1)

## 2019-02-22 LAB — GLUCOSE, CAPILLARY
Glucose-Capillary: 100 mg/dL — ABNORMAL HIGH (ref 70–99)
Glucose-Capillary: 116 mg/dL — ABNORMAL HIGH (ref 70–99)

## 2019-02-22 MED ORDER — APIXABAN 5 MG PO TABS: 5.0000 mg | ORAL_TABLET | Freq: Two times a day (BID) | ORAL | 0 refills | Status: DC

## 2019-02-22 NOTE — Progress Notes (Signed)
Received call from Juanda Crumble at 0959:patient's daughter. Confirmed with patient ok to speak with daughter since not listed in chart. Returned call at 1146, no answer.

## 2019-02-22 NOTE — Progress Notes (Signed)
Kari Harrison to be D/C'd Home per MD order.  Discussed with the patient and all questions fully answered.  VSS, Skin clean, dry and intact without evidence of skin break down, no evidence of skin tears noted.  IV catheter discontinued intact. Site without signs and symptoms of complications. Dressing and pressure applied.  An After Visit Summary was printed and given to the patient. Patient received prescription.  D/c education completed with patient/family including follow up instructions, medication list, d/c activities limitations if indicated, with other d/c instructions as indicated by MD - patient able to verbalize understanding, all questions fully answered.   Patient instructed to return to ED, call 911, or call MD for any changes in condition.   Patient escorted via [place], and D/C home via private auto.  Howard Pouch 02/22/2019 1:59 PM

## 2019-02-22 NOTE — TOC Progression Note (Addendum)
Transition of Care [facility]) - Progression Note    Patient Details  Name: Kari Harrison MRN: [id number] Date of Birth: March 13, 1956  [facility]) CM/SW Contact  Zenon Mayo, RN Phone Number: 02/22/2019, 11:52 AM  Clinical Narrative:    Patient is being dc home on eliquis, benefit check in process for, NCM gave her the 10.00 co pay card and [facility] pharmacy is filling the 30 day free for patient.  NCM informed patient if her deductible is met she can use the 10.00 co pay card. Her cell phone is [phone].  NCM informed patient of co pay of 192.00, informed her to try to use the 10.00 co pay card.  If she can not use it and copay still high to let her MD know , she states she understands.        Expected Discharge Plan and Services           Expected Discharge Date: 02/22/19                                     Social Determinants of Health (SDOH) Interventions    Readmission Risk Interventions No flowsheet data found.

## 2019-02-22 NOTE — TOC Benefit Eligibility Note (Signed)
Transition of Care [facility]) Benefit Eligibility Note    Patient Details  Name: Kari Harrison MRN: [id number] Date of Birth: 1955-05-31   Medication/Dose: Arne Cleveland  2.5 MG  BID     AND     ELIQUIS  5 MG BID  Covered?: Yes  Tier: (NO TIER)  Prescription Coverage Preferred Pharmacy: [facility] with Person/Company/Phone Number:: [employer]  @ [employer] # [phone]  Co-Pay: [phone]  Prior Approval: No  Deductible: (PLAN DON'T HAVE A DEDUCTIBLE OR OUT-OF-POCKET)       Memory Argue Phone Number: 02/22/2019, 12:52 PM

## 2019-02-22 NOTE — Discharge Summary (Signed)
Physician Discharge Summary  Kari Harrison ONG:[id number] DOB: [birth year] DOA: 02/20/2019  PCP: Sigmund Hazel, MD  Admit date: 02/20/2019 Discharge date: 02/22/2019  Admitted From: Home Disposition:  Home  Recommendations for Outpatient Follow-up:  1. Follow up with PCP in 1-2 weeks 2. Follow up with Cardiology as scheduled  Discharge Condition:Stable CODE STATUS:Full Diet recommendation: Heart healthy   Brief/Interim Summary: 63 y.o.femalewith medical history significant ofHTN; HLD: CAD s/p stent; and tobacco dependence presenting with palpitations.She reports that she was fast asleep and she woke up with the sensation that her heart was beating really fast out of her chest. She took ASA without improvement and so decided to come in to the ER. No h/o prior. She has a stent that was placed in 2016. She felt well last night without issues.   Discharge Diagnoses:  Principal Problem:   Atrial fibrillation with RVR (HCC) Active Problems:   Tobacco abuse   CAD in native artery   Hypertension   Hyperlipidemia   Marijuana abuse   Atrial Fibrillationwith RVR -Patient presenting with new-onset afib.  -Etiology is thought to be related to HTN, ischemic heart disease,or marijuana abuse,however is unknown at this time -Initially started on cardizem gtt, transitioned to PO, now back in NSR -On ASA 81 mg PO daily.  -2d echo reviewed. Normal LVEF -Seen by Cardiology. Initially plan for cardiversion, however pt now in NSR -Plan for follow up with afib clinic and with Cardiology -CHA2DS2-VASc Scoreis >2 and so patient started on Eliquis.   HTN -TakesAtenolol and Lisinopril-HCTZ at home -Resumed atenolol -Lisinopril was held secondary to soft BP while on cardizem gtt, will resume as cardizem is off  HLD -Continue Lipitor80mg  -LDL 74  Hyperglycemia -Glucose164 -No known h/o DM, will check A1c and cover with moderate-scale SSI for now  CAD -Continue  daily ASA, statin -Will resume atenolol and ACEI per home regimen  Tobacco dependence -Tobacco Dependence: encourage cessation.  -This was discussed with the patient and should be reviewed on an ongoing basis.  -Patchdeclined by patient and she reports plan to quit at time of presentation  Marijuana abuse -Cessation encouraged; this should be encouraged on an ongoing basis -UDS ordered and reviewed, confirms marijuana  Acute toxic metabolic encephalopathy -New finding on 11/23 -Infectious work up unremarkable -Resolved  Leukocytosis -WBC 12.5k with repeat of 11.9k -Pt noted to be encephalopathic, however resolved -Infectious work up neg   Discharge Instructions  Discharge Instructions    Amb referral to AFIB Clinic   Complete by: As directed      Allergies as of 02/22/2019   No Known Allergies     Medication List    STOP taking these medications   nitroGLYCERIN 0.4 MG SL tablet Commonly known as: NITROSTAT     TAKE these medications   apixaban 5 MG Tabs tablet Commonly known as: ELIQUIS Take 1 tablet (5 mg total) by mouth 2 (two) times daily.   aspirin 81 MG tablet Take 1 tablet (81 mg total) by mouth daily.   atenolol 50 MG tablet Commonly known as: TENORMIN Take 50 mg by mouth daily.   atorvastatin 80 MG tablet Commonly known as: LIPITOR take 1 tablet by mouth once daily ; MAKE DOCTOR'S APPOINTMENT ! What changed: See the new instructions.   CALCIUM PO Take 1 tablet by mouth daily.   cholecalciferol 25 MCG (1000 UT) tablet Commonly known as: VITAMIN D3 Take 1,000 Units by mouth daily.   lisinopril-hydrochlorothiazide 20-12.5 MG tablet Commonly known as: ZESTORETIC Take 2  tablets by mouth daily.   loratadine 10 MG tablet Commonly known as: CLARITIN Take 10 mg by mouth daily.   omeprazole 20 MG capsule Commonly known as: PRILOSEC Take 20 mg by mouth daily.   venlafaxine XR 75 MG 24 hr capsule Commonly known as: EFFEXOR-XR Take 75  mg by mouth daily.   vitamin C 100 MG tablet Take 100 mg by mouth daily.   ZINC PO Take 1 tablet by mouth daily.      Follow-up Information    Fenton, Clint R, PA Follow up on 03/08/2019.   Specialty: Cardiology Why: Please arrive 15 minutes early for your 10:30am appointment at that atrial fibrillation clinic Contact information: [address] [place] [address] [phone]        Rosalio Macadamia, NP Follow up on 03/23/2019.   Specialties: Nurse Practitioner, Interventional Cardiology, Cardiology, Radiology Why: Please arrive 15 minutes early for your 1:30pm cardiology appointment Contact information: [address] [place] [address] [phone]        Sigmund Hazel, MD. Schedule an appointment as soon as possible for a visit in 2 week(s).   Specialty: Family Medicine Contact information: [address] [place] [address] [phone]        Nahser, Deloris Ping, MD .   Specialty: Cardiology Contact information: [address] [place] [address] [phone]          No Known Allergies  Consultations:  Cardiology  Procedures/Studies: Dg Chest 2 View  Result Date: 02/20/2019 CLINICAL DATA:  Tachycardia. EXAM: CHEST - 2 VIEW COMPARISON:  Radiographs 06/14/2014. CT 09/22/2014 FINDINGS: The cardiomediastinal contours are unchanged. Normal heart size with unchanged aortic tortuosity. Chronic bronchial thickening. Pulmonary vasculature is normal. No consolidation, pleural effusion, or pneumothorax. No acute osseous abnormalities are seen. IMPRESSION: Chronic bronchial thickening. No acute abnormality. Electronically Signed   By: Narda Rutherford M.D.   On: 02/20/2019 05:13   Dg Chest Port 1 View  Result Date: 02/21/2019 CLINICAL DATA:  63 year old female with history of sepsis. EXAM: PORTABLE CHEST 1 VIEW COMPARISON:  Chest x-ray 02/20/2019. FINDINGS: Lung volumes are normal. No consolidative airspace disease. No  pleural effusions. No pneumothorax. No pulmonary nodule or mass noted. Pulmonary vasculature and the cardiomediastinal silhouette are within normal limits. IMPRESSION: No radiographic evidence of acute cardiopulmonary disease. Electronically Signed   By: Trudie Reed M.D.   On: 02/21/2019 16:41     Subjective: Eager to go home  Discharge Exam: Vitals:   02/22/19 0851 02/22/19 1010  BP: 121/72 (!) 132/93  Pulse: 66 65  Resp: 18   Temp: 98.9 F (37.2 C)   SpO2: 96%    Vitals:   02/22/19 0015 02/22/19 0522 02/22/19 0851 02/22/19 1010  BP: 118/77 (!) 144/81 121/72 (!) 132/93  Pulse: (!) 56 60 66 65  Resp: 15 14 18    Temp: 98.4 F (36.9 C) 98.4 F (36.9 C) 98.9 F (37.2 C)   TempSrc: Oral Oral Oral   SpO2:   96%   Weight:  78.6 kg    Height:        General: Pt is alert, awake, not in acute distress Cardiovascular: RRR, S1/S2 +, no rubs, no gallops Respiratory: CTA bilaterally, no wheezing, no rhonchi Abdominal: Soft, NT, ND, bowel sounds + Extremities: no edema, no cyanosis   The results of significant diagnostics from this hospitalization (including imaging, microbiology, ancillary and laboratory) are listed below for reference.     Microbiology: Recent Results (  from the past 240 hour(s))  SARS CORONAVIRUS 2 (TAT 6-24 HRS) Nasopharyngeal Nasopharyngeal Swab     Status: None   Collection Time: 02/20/19  5:37 AM   Specimen: Nasopharyngeal Swab  Result Value Ref Range Status   SARS Coronavirus 2 NEGATIVE NEGATIVE Final    Comment: (NOTE) SARS-CoV-2 target nucleic acids are NOT DETECTED. The SARS-CoV-2 RNA is generally detectable in upper and lower respiratory specimens during the acute phase of infection. Negative results do not preclude SARS-CoV-2 infection, do not rule out co-infections with other pathogens, and should not be used as the sole basis for treatment or other patient management decisions. Negative results must be combined with clinical  observations, patient history, and epidemiological information. The expected result is Negative. Fact Sheet for Patients: HairSlick.nohttps://www.fda.gov/media/138098/download Fact Sheet for Healthcare Providers: quierodirigir.comhttps://www.fda.gov/media/138095/download This test is not yet approved or cleared by the [place] FDA and  has been authorized for detection and/or diagnosis of SARS-CoV-2 by FDA under an Emergency Use Authorization (EUA). This EUA will remain  in effect (meaning this test can be used) for the duration of the COVID-19 declaration under Section 56 4(b)(1) of the Act, 21 U.S.C. section 360bbb-3(b)(1), unless the authorization is terminated or revoked sooner. Performed at [facility], [address] [place], [place] [address]      Labs: BNP (last 3 results) No results for input(s): BNP in the last 8760 hours. Basic Metabolic Panel: Recent Labs  Lab 02/20/19 0447 02/21/19 0530 02/22/19 0506  NA 141 139 140  K 3.5 3.3* 4.2  CL 107 105 105  CO2 22 27 27   GLUCOSE 164* 118* 102*  BUN 15 12 16   CREATININE 0.84 0.84 1.09*  CALCIUM 9.6 9.0 9.5  MG 1.8  --   --    Liver Function Tests: Recent Labs  Lab 02/22/19 0506  AST 33  ALT 43  ALKPHOS 105  BILITOT 1.6*  PROT 6.6  ALBUMIN 3.4*   No results for input(s): LIPASE, AMYLASE in the last 168 hours. No results for input(s): AMMONIA in the last 168 hours. CBC: Recent Labs  Lab 02/20/19 0447 02/21/19 0530 02/22/19 0506  WBC 12.5* 11.9* 11.9*  HGB 17.0* 15.5* 15.5*  HCT 50.8* 45.9 45.8  MCV 93.6 93.7 92.3  PLT 334 298 297   Cardiac Enzymes: No results for input(s): CKTOTAL, CKMB, CKMBINDEX, TROPONINI in the last 168 hours. BNP: Invalid input(s): POCBNP CBG: Recent Labs  Lab 02/21/19 0811 02/21/19 1119 02/21/19 1629 02/21/19 2105 02/22/19 0615  GLUCAP 116* 96 122* 90 100*   D-Dimer No results for input(s): DDIMER in the last 72 hours. Hgb A1c Recent Labs    02/20/19 1456  HGBA1C 6.1*    Lipid Profile Recent Labs    02/21/19 0530  CHOL 134  HDL 33*  LDLCALC 74  TRIG 478135  CHOLHDL 4.1   Thyroid function studies Recent Labs    02/20/19 0607  TSH 4.492   Anemia work up No results for input(s): VITAMINB12, FOLATE, FERRITIN, TIBC, IRON, RETICCTPCT in the last 72 hours. Urinalysis    Component Value Date/Time   COLORURINE YELLOW 02/21/2019 2208   APPEARANCEUR CLEAR 02/21/2019 2208   LABSPEC 1.023 02/21/2019 2208   PHURINE 5.0 02/21/2019 2208   GLUCOSEU NEGATIVE 02/21/2019 2208   HGBUR LARGE (A) 02/21/2019 2208   BILIRUBINUR NEGATIVE 02/21/2019 2208   KETONESUR NEGATIVE 02/21/2019 2208   PROTEINUR NEGATIVE 02/21/2019 2208   NITRITE NEGATIVE 02/21/2019 2208   LEUKOCYTESUR TRACE (A) 02/21/2019 2208  Sepsis Labs Invalid input(s): PROCALCITONIN,  WBC,  LACTICIDVEN Microbiology Recent Results (from the past 240 hour(s))  SARS CORONAVIRUS 2 (TAT 6-24 HRS) Nasopharyngeal Nasopharyngeal Swab     Status: None   Collection Time: 02/20/19  5:37 AM   Specimen: Nasopharyngeal Swab  Result Value Ref Range Status   SARS Coronavirus 2 NEGATIVE NEGATIVE Final    Comment: (NOTE) SARS-CoV-2 target nucleic acids are NOT DETECTED. The SARS-CoV-2 RNA is generally detectable in upper and lower respiratory specimens during the acute phase of infection. Negative results do not preclude SARS-CoV-2 infection, do not rule out co-infections with other pathogens, and should not be used as the sole basis for treatment or other patient management decisions. Negative results must be combined with clinical observations, patient history, and epidemiological information. The expected result is Negative. Fact Sheet for Patients: SugarRoll.be Fact Sheet for Healthcare Providers: https://www.woods-mathews.com/ This test is not yet approved or cleared by the [place] FDA and  has been authorized for detection and/or diagnosis of SARS-CoV-2  by FDA under an Emergency Use Authorization (EUA). This EUA will remain  in effect (meaning this test can be used) for the duration of the COVID-19 declaration under Section 56 4(b)(1) of the Act, 21 U.S.C. section 360bbb-3(b)(1), unless the authorization is terminated or revoked sooner. Performed at [facility], [place] [address] [place], [place] [address]    Time spent: 30 min  SIGNED:   Marylu Lund, MD  [facility] 02/22/2019, 11:12 AM  If 7PM-7AM, please contact night-coverage

## 2019-02-23 LAB — URINE CULTURE: Culture: 80000 — AB

## 2019-02-26 LAB — CULTURE, BLOOD (ROUTINE X 2)
Culture: NO GROWTH
Culture: NO GROWTH
Special Requests: ADEQUATE
Special Requests: ADEQUATE

## 2019-03-08 ENCOUNTER — Other Ambulatory Visit: Payer: Self-pay

## 2019-03-08 ENCOUNTER — Encounter (HOSPITAL_COMMUNITY): Payer: Self-pay | Admitting: Physician Assistant

## 2019-03-08 ENCOUNTER — Ambulatory Visit (HOSPITAL_COMMUNITY)
Admission: RE | Admit: 2019-03-08 | Discharge: 2019-03-08 | Disposition: A | Discharge status: Home / Self Care | Payer: Commercial Managed Care - PPO | Source: Ambulatory Visit | Attending: Physician Assistant | Admitting: Physician Assistant

## 2019-03-08 VITALS — BP 122/80 | HR 61 | Ht 62.0 in | Wt 175.0 lb

## 2019-03-08 DIAGNOSIS — I2510 Atherosclerotic heart disease of native coronary artery without angina pectoris: Secondary | ICD-10-CM | POA: Insufficient documentation

## 2019-03-08 DIAGNOSIS — Z6832 Body mass index (BMI) 32.0-32.9, adult: Secondary | ICD-10-CM | POA: Insufficient documentation

## 2019-03-08 DIAGNOSIS — F17210 Nicotine dependence, cigarettes, uncomplicated: Secondary | ICD-10-CM | POA: Insufficient documentation

## 2019-03-08 DIAGNOSIS — I480 Paroxysmal atrial fibrillation: Secondary | ICD-10-CM | POA: Diagnosis not present

## 2019-03-08 DIAGNOSIS — Z955 Presence of coronary angioplasty implant and graft: Secondary | ICD-10-CM | POA: Insufficient documentation

## 2019-03-08 DIAGNOSIS — R0683 Snoring: Secondary | ICD-10-CM | POA: Diagnosis not present

## 2019-03-08 DIAGNOSIS — E785 Hyperlipidemia, unspecified: Secondary | ICD-10-CM | POA: Insufficient documentation

## 2019-03-08 DIAGNOSIS — D6869 Other thrombophilia: Secondary | ICD-10-CM | POA: Insufficient documentation

## 2019-03-08 DIAGNOSIS — I10 Essential (primary) hypertension: Secondary | ICD-10-CM | POA: Insufficient documentation

## 2019-03-08 DIAGNOSIS — Z7901 Long term (current) use of anticoagulants: Secondary | ICD-10-CM | POA: Insufficient documentation

## 2019-03-08 DIAGNOSIS — E669 Obesity, unspecified: Secondary | ICD-10-CM | POA: Diagnosis not present

## 2019-03-08 DIAGNOSIS — Z7982 Long term (current) use of aspirin: Secondary | ICD-10-CM | POA: Insufficient documentation

## 2019-03-08 DIAGNOSIS — Z79899 Other long term (current) drug therapy: Secondary | ICD-10-CM | POA: Insufficient documentation

## 2019-03-08 DIAGNOSIS — Z8249 Family history of ischemic heart disease and other diseases of the circulatory system: Secondary | ICD-10-CM | POA: Diagnosis not present

## 2019-03-08 NOTE — Progress Notes (Signed)
Primary Care Physician: [name], MD Primary Cardiologist: Dr McLean/Dr Acie Fredrickson Primary Electrophysiologist: none Referring Physician: Dr Gust Rung is a 63 y.o. female with a history of hypertension, hyperlipidemia, coronary artery disease, tobacco abuse and paroxysmal atrial fibrillation who presents for consultation in the [facility].  The patient was initially diagnosed with atrial fibrillation 02/20/19 after presenting to the ER with symptoms of tachy palpitations and was found to be in afib with RVR. She was admitted and rate controlled on diltiazem drip. She was started on Eliquis for a CHADS2VASC score of 3. A TEE/DCCV was planned but she converted to SR on her own. She reports that since her hospitalization, she has felt well with no heart racing or palpitations. She has eliminated caffeine from her diet and has reduced her cigarette use to 2 per day. She does have symptoms of snoring and daytime somnolence. She denies any alcohol use. She is tolerating the Eliquis without difficulty.   Today, she denies symptoms of palpitations, chest pain, shortness of breath, orthopnea, PND, lower extremity edema, dizziness, presyncope, syncope, bleeding, or neurologic sequela. The patient is tolerating medications without difficulties and is otherwise without complaint today.    Atrial Fibrillation Risk Factors:  she does have symptoms or diagnosis of sleep apnea. she does not have a history of rheumatic fever. she does not have a history of alcohol use. The patient does not have a history of early familial atrial fibrillation or other arrhythmias.  she has a BMI of Body mass index is 32.01 kg/m.Marland Kitchen Filed Weights   03/08/19 1040  Weight: 79.4 kg    Family History  Problem Relation Age of Onset  . Arthritis Mother   . Macular degeneration Mother   . Heart attack Father   . Hypertension Sister   . Atrial fibrillation Neg Hx      Atrial  Fibrillation Management history:  Previous antiarrhythmic drugs: none Previous cardioversions: none Previous ablations: none CHADS2VASC score: 3 Anticoagulation history: Eliquis   Past Medical History:  Diagnosis Date  . CAD (coronary artery disease)    a. Cardiac CTA: severe 3VD;  b. 05/2014 Cath/PCI: LM nl, LAD 50-60p/m, 110m, D1 50ost/m, RI small/plaque, LCX dominant 20-30p, 24m, 60/90d (2.25x24 Promus DES), OM2 40-25m, RCA nondom, mod diff plaque, EF 55%.  Marland Kitchen Hyperlipidemia   . Hypertension   . Pulmonary nodules    a. 05/2014 CTA: left lung pulm nodules w/ rec for 3 month f/u noncontrast CT.  . Tobacco abuse    Past Surgical History:  Procedure Laterality Date  . CESAREAN SECTION    . LEFT HEART CATHETERIZATION WITH CORONARY ANGIOGRAM N/A 06/16/2014   Procedure: LEFT HEART CATHETERIZATION WITH CORONARY ANGIOGRAM;  Surgeon: Jolaine Artist, MD;  Location: [facility] CATH LAB;  Service: Cardiovascular;  Laterality: N/A;    Current Outpatient Medications  Medication Sig Dispense Refill  . apixaban (ELIQUIS) 5 MG TABS tablet Take 1 tablet (5 mg total) by mouth 2 (two) times daily. 60 tablet 0  . Ascorbic Acid (VITAMIN C) 100 MG tablet Take 100 mg by mouth daily.    Marland Kitchen aspirin 81 MG tablet Take 1 tablet (81 mg total) by mouth daily. 30 tablet   . atenolol (TENORMIN) 50 MG tablet Take 50 mg by mouth daily.  0  . atorvastatin (LIPITOR) 80 MG tablet take 1 tablet by mouth once daily ; MAKE DOCTOR'S APPOINTMENT ! (Patient taking differently: Take 80 mg by mouth daily. ) 15 tablet  0  . CALCIUM PO Take 1 tablet by mouth daily.    . cholecalciferol (VITAMIN D3) 25 MCG (1000 UT) tablet Take 1,000 Units by mouth daily.    Marland Kitchen lisinopril-hydrochlorothiazide (PRINZIDE,ZESTORETIC) 20-12.5 MG per tablet Take 2 tablets by mouth daily.  0  . loratadine (CLARITIN) 10 MG tablet Take 10 mg by mouth daily.    . Multiple Vitamins-Minerals (ZINC PO) Take 1 tablet by mouth daily.    Marland Kitchen omeprazole (PRILOSEC) 20 MG  capsule Take 20 mg by mouth daily.    Marland Kitchen venlafaxine XR (EFFEXOR-XR) 75 MG 24 hr capsule Take 75 mg by mouth daily.  0   No current facility-administered medications for this encounter.     No Known Allergies  Social History   Socioeconomic History  . Marital status: Married    Spouse name: Not on file  . Number of children: Not on file  . Years of education: Not on file  . Highest education level: Not on file  Occupational History  . Occupation: Agricultural consultant  Social Needs  . Financial resource strain: Not on file  . Food insecurity    Worry: Not on file    Inability: Not on file  . Transportation needs    Medical: Not on file    Non-medical: Not on file  Tobacco Use  . Smoking status: Current Every Day Smoker    Packs/day: 0.02    Years: 24.00    Pack years: 0.48    Types: Cigarettes  . Smokeless tobacco: Never Used  . Tobacco comment: denies need for a patch  Substance and Sexual Activity  . Alcohol use: No    Alcohol/week: 0.0 standard drinks  . Drug use: Not Currently    Types: Marijuana    Comment: occasional use  . Sexual activity: Not on file  Lifestyle  . Physical activity    Days per week: Not on file    Minutes per session: Not on file  . Stress: Not on file  Relationships  . Social Musician on phone: Not on file    Gets together: Not on file    Attends religious service: Not on file    Active member of club or organization: Not on file    Attends meetings of clubs or organizations: Not on file    Relationship status: Not on file  . Intimate partner violence    Fear of current or ex partner: Not on file    Emotionally abused: Not on file    Physically abused: Not on file    Forced sexual activity: Not on file  Other Topics Concern  . Not on file  Social History Narrative  . Not on file     ROS- All systems are reviewed and negative except as per the HPI above.  Physical Exam: Vitals:   03/08/19 1040  BP: 122/80  Pulse:  61  Weight: 79.4 kg  Height: 5\' 2"  (1.575 m)    GEN- The patient is well appearing obese female, alert and oriented x 3 today.   Head- normocephalic, atraumatic Eyes-  Sclera clear, conjunctiva pink Ears- hearing intact Oropharynx- clear Neck- supple  Lungs- Clear to ausculation bilaterally, normal work of breathing Heart- Regular rate and rhythm, no murmurs, rubs or gallops  GI- soft, NT, ND, + BS Extremities- no clubbing, cyanosis, or edema MS- no significant deformity or atrophy Skin- no rash or lesion Psych- euthymic mood, full affect Neuro- strength and sensation are intact  Wt Readings from Last 3 Encounters:  03/08/19 79.4 kg  02/22/19 78.6 kg  10/27/14 76.2 kg    EKG today demonstrates SR HR 61, NST similar to previous, PR 162, QRS 74, QTc 418  Echo 02/20/19 demonstrated  1. Left ventricular ejection fraction, by visual estimation, is 60 to 65%. The left ventricle has normal function. There is mildly increased left ventricular hypertrophy.  2. Left ventricular diastolic parameters are indeterminate.  3. Global right ventricle has normal systolic function.The right ventricular size is normal. No increase in right ventricular wall thickness.  4. Left atrial size was normal.  5. Right atrial size was normal.  6. Moderate aortic valve annular calcification.  7. The mitral valve is normal in structure. No evidence of mitral valve regurgitation. No evidence of mitral stenosis.  8. The tricuspid valve is normal in structure. Tricuspid valve regurgitation is not demonstrated.  9. The aortic valve was not well visualized. Aortic valve regurgitation is not visualized. No evidence of aortic valve sclerosis or stenosis. 10. There is Moderate calcification of the aortic valve. 11. There is Moderate thickening of the aortic valve. 12. The pulmonic valve was not well visualized. Pulmonic valve regurgitation is not visualized. 13. The inferior vena cava is normal in size with greater  than 50% respiratory variability, suggesting right atrial pressure of 3 mmHg.   Epic records are reviewed at length today  Assessment and Plan:  1. New onset paroxysmal atrial fibrillation General education about afib discussed and questions answered. We also discussed her stroke risk and the risks and benefits of anticoagulation. Continue atenolol 50 mg daily. Patient may take an extra PRN dose for heart racing >100 bpm. We discussed possibility for AAD in the future if her afib should become more persistent. Would avoid class 1C given CAD. Continue Eliquis 5 mg BID  This patients CHA2DS2-VASc Score and unadjusted Ischemic Stroke Rate (% per year) is equal to 3.2 % stroke rate/year from a score of 3  Above score calculated as 1 point each if present [CHF, HTN, DM, Vascular=MI/PAD/Aortic Plaque, Age if 65-74, or Female] Above score calculated as 2 points each if present [Age > 75, or Stroke/TIA/TE]   2. Obesity Body mass index is 32.01 kg/m. Lifestyle modification was discussed at length including regular exercise and weight reduction.  3. Snoring/daytime somnolence The importance of adequate treatment of sleep apnea was discussed today in order to improve our ability to maintain sinus rhythm long term. Will refer for sleep study.  4. CAD S/p PCI in 2016. No anginal symptoms today. She is on ASA, will defer decision to stop ASA now that she is on Eliquis to primary cardiology.   5. HTN Stable, no changes today.   Follow up in the AF clinic in 3 months.   Jorja Loaicky Allisa Einspahr PA-C [facility] [address] [place], [place] [address] [phone] 03/08/2019 11:10 AM

## 2019-03-16 NOTE — Progress Notes (Signed)
CARDIOLOGY OFFICE NOTE  Date:  03/23/2019    Kari ApoGindy Wilson Zorn Date of Birth: 13-Feb-1956 Medical Record #[id number]  PCP:  Sigmund HazelMiller, Lisa, MD  Cardiologist:  Shirlee LatchMcLean  Chief Complaint  Patient presents with  . Follow-up    History of Present Illness: Kari Harrison is a 63 y.o. female who presents today for a post hospital visit.   She has a history of known CAD, HTN, tobacco abuse, lung nodules and HLD.  She has had prior cardiac CT showing severe 3 VD - s/p PCI of the LCX.  I last saw her in 2016 - doing ok but still smoking. Lots of stress with her family/work.   Presented to the hospital recently with palpitations - found to have AF - she converted - no cardioversion. She has had follow up in the AF clinic and was also to follow back up here. She is on anticoagulation now. Stress testing was recommended as outpatient.   The patient does not have symptoms concerning for COVID-19 infection (fever, chills, cough, or new shortness of breath).   Comes in today. Here alone. Doing better. Is weaning off [facility] and coffee. Smoking less. To have sleep study set up per AF clinic for the New Year. No palpitations. No bleeding/bruising. She feels like she is doing ok.   Past Medical History:  Diagnosis Date  . CAD (coronary artery disease)    a. Cardiac CTA: severe 3VD;  b. 05/2014 Cath/PCI: LM nl, LAD 50-60p/m, 6598m, D1 50ost/m, RI small/plaque, LCX dominant 20-30p, 4982m, 60/90d (2.25x24 Promus DES), OM2 40-3182m, RCA nondom, mod diff plaque, EF 55%.  Marland Kitchen. Hyperlipidemia   . Hypertension   . Pulmonary nodules    a. 05/2014 CTA: left lung pulm nodules w/ rec for 3 month f/u noncontrast CT.  . Tobacco abuse     Past Surgical History:  Procedure Laterality Date  . CESAREAN SECTION    . LEFT HEART CATHETERIZATION WITH CORONARY ANGIOGRAM N/A 06/16/2014   Procedure: LEFT HEART CATHETERIZATION WITH CORONARY ANGIOGRAM;  Surgeon: Dolores Pattyaniel R Bensimhon, MD;  [facility];   Service: Cardiovascular;  Laterality: N/A;     Medications: Current Meds  Medication Sig  . apixaban (ELIQUIS) 5 MG TABS tablet Take 1 tablet (5 mg total) by mouth 2 (two) times daily.  . Ascorbic Acid (VITAMIN C) 100 MG tablet Take 100 mg by mouth daily.  Marland Kitchen. aspirin 81 MG tablet Take 1 tablet (81 mg total) by mouth daily.  Marland Kitchen. atenolol (TENORMIN) 50 MG tablet Take 50 mg by mouth daily.  Marland Kitchen. atorvastatin (LIPITOR) 80 MG tablet take 1 tablet by mouth once daily ; MAKE DOCTOR'S APPOINTMENT ! (Patient taking differently: Take 80 mg by mouth daily. )  . CALCIUM PO Take 1 tablet by mouth daily.  . cholecalciferol (VITAMIN D3) 25 MCG (1000 UT) tablet Take 1,000 Units by mouth daily.  Marland Kitchen. lisinopril-hydrochlorothiazide (PRINZIDE,ZESTORETIC) 20-12.5 MG per tablet Take 2 tablets by mouth daily.  Marland Kitchen. loratadine (CLARITIN) 10 MG tablet Take 10 mg by mouth daily.  . Multiple Vitamins-Minerals (ZINC PO) Take 1 tablet by mouth daily.  Marland Kitchen. omeprazole (PRILOSEC) 20 MG capsule Take 20 mg by mouth daily.  Marland Kitchen. venlafaxine XR (EFFEXOR-XR) 75 MG 24 hr capsule Take 75 mg by mouth daily.     Allergies: No Known Allergies  Social History: The patient  reports that she has been smoking cigarettes. She has a 0.48 pack-year smoking history. She has never used smokeless tobacco. She reports  previous drug use. Drug: Marijuana. She reports that she does not drink alcohol.   Family History: The patient's family history includes Arthritis in her mother; Heart attack in her father; Hypertension in her sister; Macular degeneration in her mother.   Review of Systems: Please see the history of present illness.   All other systems are reviewed and negative.   Physical Exam: VS:  BP 128/86   Pulse 67   Ht 5' 1.5" (1.562 m)   Wt 173 lb (78.5 kg)   SpO2 97%   BMI 32.16 kg/m  .  BMI Body mass index is 32.16 kg/m.  Wt Readings from Last 3 Encounters:  03/23/19 173 lb (78.5 kg)  03/08/19 175 lb (79.4 kg)  02/22/19 173 lb 3.2  oz (78.6 kg)    General: Pleasant. Well developed, well nourished and in no acute distress.   HEENT: Normal.  Neck: Supple, no JVD, carotid bruits, or masses noted.  Cardiac: Regular rate and rhythm. No murmurs, rubs, or gallops. No edema.  Respiratory:  Lungs are clear to auscultation bilaterally with normal work of breathing.  GI: Soft and nontender.  MS: No deformity or atrophy. Gait and ROM intact.  Skin: Warm and dry. Color is normal.  Neuro:  Strength and sensation are intact and no gross focal deficits noted.  Psych: Alert, appropriate and with normal affect.   LABORATORY DATA:  EKG:  EKG is not ordered today.   Lab Results  Component Value Date   WBC 11.9 (H) 02/22/2019   HGB 15.5 (H) 02/22/2019   HCT 45.8 02/22/2019   PLT 297 02/22/2019   GLUCOSE 102 (H) 02/22/2019   CHOL 134 02/21/2019   TRIG 135 02/21/2019   HDL 33 (L) 02/21/2019   LDLCALC 74 02/21/2019   ALT 43 02/22/2019   AST 33 02/22/2019   NA 140 02/22/2019   K 4.2 02/22/2019   CL 105 02/22/2019   CREATININE 1.09 (H) 02/22/2019   BUN 16 02/22/2019   CO2 27 02/22/2019   TSH 4.492 02/20/2019   INR 0.98 06/15/2014   HGBA1C 6.1 (H) 02/20/2019     BNP (last 3 results) No results for input(s): BNP in the last 8760 hours.  ProBNP (last 3 results) No results for input(s): PROBNP in the last 8760 hours.   Other Studies Reviewed Today:  Echocardiogram 02/20/2019: 1. Left ventricular ejection fraction, by visual estimation, is 60 to 65%. The left ventricle has normal function. There is mildly increased left ventricular hypertrophy. 2. Left ventricular diastolic parameters are indeterminate. 3. Global right ventricle has normal systolic function.The right ventricular size is normal. No increase in right ventricular wall thickness. 4. Left atrial size was normal. 5. Right atrial size was normal. 6. Moderate aortic valve annular calcification. 7. The mitral valve is normal in structure. No evidence  of mitral valve regurgitation. No evidence of mitral stenosis. 8. The tricuspid valve is normal in structure. Tricuspid valve regurgitation is not demonstrated. 9. The aortic valve was not well visualized. Aortic valve regurgitation is not visualized. No evidence of aortic valve sclerosis or stenosis. 10. There is Moderate calcification of the aortic valve. 11. There is Moderate thickening of the aortic valve. 12. The pulmonic valve was not well visualized. Pulmonic valve regurgitation is not visualized. 13. The inferior vena cava is normal in size with greater than 50% respiratory variability, suggesting right atrial pressure of 3 mmHg.   PROCEDURE 2016: Cath/PCI distal left circumflex. Left main: Mild calcification. No significant stenosis.   LAD:  Long vessel wraps apex. 50-60% lesion in proximal to mid LAD prior to take-off of first diagonal. There is a 40% lesion in the mid LAD just after the D1 take-off. Mild plaque in distal LAD. In large D1 there is 50% lesion ostially and 50% lesion in midsection.  LCX: Small ramus with moderate ostial plaque. Large dominant circumflex. Gives off large branching OM-1. 3 PLS and a PDA. In the proximal LCX there is a 20-30% stenosis. In the mid LCX there is a 50% lesion just after the take-off of the OM-1. In the distal LCX there is a 60% lesion after the first PL and a 90% lesion prior to the 2nd PL. In the large OM-1 there is a 40-50% mid lesion   RCA: Moderate sized non-dominant vessel with moderate diffuse plaque.  LV-gram done in the RAO projection: Ejection fraction = 55% nor regional wall motion abnormalities   IMPRESSIONS:  1. Successful PCI of the distal left circumflex artery with a 2.25 x 24 Promus drug-eluting stent, postdilated to greater than 2.5 mm in diameter. 2. Unsuccessful attempt at diagnostic cardiac cath from the right radial approach due to radial loop.  RECOMMENDATION: Continue dual antiplatelet therapy for  at least a year. Continue aggressive secondary prevention.     CT Chest IMPRESSION 2016: 1. Previously noted pulmonary nodules in the left lung have completely resolved, compatible with benign infectious or inflammatory nodules on the prior study. 2. Atherosclerosis, including three-vessel coronary artery disease, status post PTCI to the left circumflex coronary artery.   Electronically Signed  By: Trudie Reed M.D.  On: 09/22/2014 09:14  Assessment/Plan:  1. PAF - remains in NSR - CHADSVASC of at least 3 - remains on anticoagulation and this will be life long. Lab today. Will arrange for Lexiscan. She is to have sleep study set up thru AF clinic per her report.   2. HTN - BP is good. No changes made today.   3. Tobacco abuse - smoking less - total cessation encouraged.   4. CAD with prior PCI/DES to LCX - will arrange for Lexiscan to rule out ischemia as etiology.   5. HLD:  on statin  6. Borderline DM - per PCP - sounds like she is trying to consume less sugar.  7.  COVID-19 Education: The signs and symptoms of COVID-19 were discussed with the patient and how to seek care for testing (follow up with PCP or arrange E-visit).  The importance of social distancing, staying at home, hand hygiene and wearing a mask when out in public were discussed today.  Current medicines are reviewed with the patient today.  The patient does not have concerns regarding medicines other than what has been noted above.  The following changes have been made:  See above.  Labs/ tests ordered today include:    Orders Placed This Encounter  Procedures  . Basic metabolic panel  . CBC no Diff  . MYOCARDIAL PERFUSION IMAGING     Disposition:   FU with [place] in 6 months. Lab today. Arrange Myoview. She has follow up in AF clinic as well - sleep study to be arranged in the New Year.   Patient is agreeable to this plan and will call if any problems develop in the interim.   SignedNorma Fredrickson, NP  03/23/2019 1:52 PM  [facility] [address] [place], [place]  [address] Phone: [phone] Fax: [phone]

## 2019-03-18 ENCOUNTER — Telehealth: Payer: Self-pay | Admitting: Nurse Practitioner

## 2019-03-18 NOTE — Telephone Encounter (Signed)
Spoke with pt and advised information will be forwarded to her provider and if there is a problem with switching 03/23/2019 appointment to a virtual visit someone will contact her.

## 2019-03-18 NOTE — Telephone Encounter (Signed)
Patient changed her appointment to virtual. She is worried about exposure, and has had other hospital follow-ups that have gone smoothly. If for any reason Cecille Rubin feels she needs to come into the office the patient is willing to do so, but she prefers the virtual visit

## 2019-03-19 NOTE — Telephone Encounter (Signed)
She is going to need follow up labs - I would prefer she come in if agreeable.  Kari Harrison

## 2019-03-21 NOTE — Telephone Encounter (Signed)
Returned call pt re: appt 03/23/2019 and pt switching to virtual.  Left a message for pt to call back.  If pt calls, per Cecille Rubin, she would prefer pt come in-office for this appointment, as pt will need lab work.

## 2019-03-21 NOTE — Telephone Encounter (Signed)
Patient is agreeable to come in for her appt 12/23. She wanted to know if the labs that would be done would be fasting or not. Please advise

## 2019-03-22 NOTE — Telephone Encounter (Signed)
Advised with Cecille Rubin pt did not need to fast for ov tomorrow.

## 2019-03-23 ENCOUNTER — Ambulatory Visit (INDEPENDENT_AMBULATORY_CARE_PROVIDER_SITE_OTHER): Payer: 59 | Admitting: Nurse Practitioner

## 2019-03-23 ENCOUNTER — Other Ambulatory Visit: Payer: Self-pay

## 2019-03-23 ENCOUNTER — Encounter: Payer: Self-pay | Admitting: Nurse Practitioner

## 2019-03-23 VITALS — BP 128/86 | HR 67 | Ht 61.5 in | Wt 173.0 lb

## 2019-03-23 DIAGNOSIS — I480 Paroxysmal atrial fibrillation: Secondary | ICD-10-CM | POA: Diagnosis not present

## 2019-03-23 DIAGNOSIS — Z7901 Long term (current) use of anticoagulants: Secondary | ICD-10-CM

## 2019-03-23 DIAGNOSIS — I259 Chronic ischemic heart disease, unspecified: Secondary | ICD-10-CM

## 2019-03-23 NOTE — Patient Instructions (Addendum)
After Visit Summary:  We will be checking the following labs today - BMET & CBC   Medication Instructions:    Continue with your current medicines.    If you need a refill on your cardiac medications before your next appointment, please call your pharmacy.     Testing/Procedures To Be Arranged:  Lexiscan  You are scheduled for a Myocardial Perfusion Imaging Study on _______________________________ at _________________________________________.   Please arrive 15 minutes prior to your appointment time for registration and insurance purposes.   The test will take approximately 3 to 4 hours to complete; you may bring reading material. If someone comes with you to your appointment, they will need to remain in the main lobby due to limited space in the testing area.    How to prepare for your Myocardial Perfusion test:   Do not eat or drink 3 hours prior to your test, except you may have water.    Do not consume products containing caffeine (regular or decaffeinated) 12 hours prior to your test (ex: coffee, chocolate, soda, tea)   Do bring a list of your current medications with you. If not listed below, you may take your medications as normal.    Bring any held medication to your appointment, as you may be required to take it once the test is complete.   Do wear comfortable clothes (no dresses or overalls) and walking shoes. Tennis shoes are preferred. No heels or open toed shoes.  Do not wear cologne, perfume, aftershave or lotions (deodorant is allowed).   If these instructions are not followed, you test will have to be rescheduled.   Please report to [address] for your test. If you have questions or concerns about your appointment, please call the Nuclear Lab at [phone].  If you cannot keep your appointment, please provide 24 hour notification to the Nuclear lab to avoid a possible $50 charge to your account.       Follow-Up:   See [place]  in 6 months    At [facility], you and your health needs are our priority.  As part of our continuing mission to provide you with exceptional heart care, we have created designated Provider Care Teams.  These Care Teams include your primary Cardiologist (physician) and Advanced Practice Providers (APPs -  Physician Assistants and Nurse Practitioners) who all work together to provide you with the care you need, when you need it.  Special Instructions:  . Stay safe, stay home, wash your hands for at least 20 seconds and wear a mask when out in public.  . It was good to talk with you today.    Call the [place] office at [phone] if you have any questions, problems or concerns.

## 2019-03-24 ENCOUNTER — Other Ambulatory Visit: Payer: Self-pay | Admitting: *Deleted

## 2019-03-24 DIAGNOSIS — D6869 Other thrombophilia: Secondary | ICD-10-CM

## 2019-03-24 LAB — CBC
Hematocrit: 45.7 % (ref 34.0–46.6)
Hemoglobin: 15.6 g/dL (ref 11.1–15.9)
MCH: 31.8 pg (ref 26.6–33.0)
MCHC: 34.1 g/dL (ref 31.5–35.7)
MCV: 93 fL (ref 79–97)
Platelets: 320 10*3/uL (ref 150–450)
RBC: 4.91 x10E6/uL (ref 3.77–5.28)
RDW: 12.6 % (ref 11.7–15.4)
WBC: 13 10*3/uL — ABNORMAL HIGH (ref 3.4–10.8)

## 2019-03-24 LAB — BASIC METABOLIC PANEL
BUN/Creatinine Ratio: 13 (ref 12–28)
BUN: 12 mg/dL (ref 8–27)
CO2: 25 mmol/L (ref 20–29)
Calcium: 9.5 mg/dL (ref 8.7–10.3)
Chloride: 102 mmol/L (ref 96–106)
Creatinine, Ser: 0.89 mg/dL (ref 0.57–1.00)
GFR calc Af Amer: 79 mL/min/{1.73_m2} (ref >59)
GFR calc non Af Amer: 69 mL/min/{1.73_m2} (ref >59)
Glucose: 91 mg/dL (ref 65–99)
Potassium: 3.7 mmol/L (ref 3.5–5.2)
Sodium: 141 mmol/L (ref 134–144)

## 2019-04-05 ENCOUNTER — Telehealth (HOSPITAL_COMMUNITY): Payer: Self-pay | Admitting: *Deleted

## 2019-04-05 NOTE — Telephone Encounter (Signed)
Patient given detailed instructions per Myocardial Perfusion Study Information Sheet for the test on 04/08/19 at 7:30. Patient notified to arrive 15 minutes early and that it is imperative to arrive on time for appointment to keep from having the test rescheduled.  If you need to cancel or reschedule your appointment, please call the office within 24 hours of your appointment. . Patient verbalized understanding.Kari Harrison

## 2019-04-08 ENCOUNTER — Other Ambulatory Visit: Payer: Self-pay

## 2019-04-08 ENCOUNTER — Ambulatory Visit (HOSPITAL_COMMUNITY): Payer: Commercial Managed Care - PPO | Attending: Internal Medicine

## 2019-04-08 ENCOUNTER — Encounter (INDEPENDENT_AMBULATORY_CARE_PROVIDER_SITE_OTHER): Payer: Self-pay

## 2019-04-08 DIAGNOSIS — Z7901 Long term (current) use of anticoagulants: Secondary | ICD-10-CM | POA: Insufficient documentation

## 2019-04-08 DIAGNOSIS — I480 Paroxysmal atrial fibrillation: Secondary | ICD-10-CM | POA: Insufficient documentation

## 2019-04-08 DIAGNOSIS — I259 Chronic ischemic heart disease, unspecified: Secondary | ICD-10-CM | POA: Insufficient documentation

## 2019-04-08 LAB — MYOCARDIAL PERFUSION IMAGING
LV dias vol: 53 mL (ref 46–106)
LV sys vol: 21 mL
Peak HR: 90 {beats}/min
Rest HR: 61 {beats}/min
SDS: 2
SRS: 0
SSS: 2
TID: 1.04

## 2019-04-08 MED ORDER — TECHNETIUM TC 99M TETROFOSMIN IV KIT
32.3000 | PACK | Freq: Once | INTRAVENOUS | Status: AC | PRN
  Administered 2019-04-08: 32.3 via INTRAVENOUS
  Filled 2019-04-08: qty 33

## 2019-04-08 MED ORDER — REGADENOSON 0.4 MG/5ML IV SOLN
0.4000 mg | Freq: Once | INTRAVENOUS | Status: AC
  Administered 2019-04-08: 0.4 mg via INTRAVENOUS

## 2019-04-08 MED ORDER — TECHNETIUM TC 99M TETROFOSMIN IV KIT
11.0000 | PACK | Freq: Once | INTRAVENOUS | Status: AC | PRN
  Administered 2019-04-08: 11 via INTRAVENOUS
  Filled 2019-04-08: qty 11

## 2019-04-11 ENCOUNTER — Telehealth: Payer: Self-pay | Admitting: Nurse Practitioner

## 2019-04-11 NOTE — Telephone Encounter (Signed)
Patient has an appt on 09/12/19 with Norma Fredrickson. She cannot have an appt on a Monday but needs a Friday. I dont have access to schedule appts out that far in advance, will need Lori's nurse to call and reschedule patient.

## 2019-04-12 NOTE — Telephone Encounter (Signed)
S/w pt is going to keep upcoming appt in June.

## 2019-04-19 ENCOUNTER — Telehealth: Payer: Self-pay | Admitting: *Deleted

## 2019-04-19 NOTE — Telephone Encounter (Signed)
Staff message sent to [place] per Christel Mormon C with [employer] no PA is required for in lab sleep study. Call reference # [id number].

## 2019-04-22 ENCOUNTER — Other Ambulatory Visit: Payer: Self-pay

## 2019-04-22 ENCOUNTER — Other Ambulatory Visit: Payer: Commercial Managed Care - PPO | Admitting: *Deleted

## 2019-04-22 DIAGNOSIS — D6869 Other thrombophilia: Secondary | ICD-10-CM

## 2019-04-22 LAB — CBC WITH DIFFERENTIAL/PLATELET
Basophils Absolute: 0.1 x10E3/uL (ref 0.0–0.2)
Basos: 1 %
EOS (ABSOLUTE): 0.5 x10E3/uL — ABNORMAL HIGH (ref 0.0–0.4)
Eos: 4 %
Hematocrit: 41.2 % (ref 34.0–46.6)
Hemoglobin: 14.5 g/dL (ref 11.1–15.9)
Immature Grans (Abs): 0.1 x10E3/uL (ref 0.0–0.1)
Immature Granulocytes: 1 %
Lymphocytes Absolute: 3 x10E3/uL (ref 0.7–3.1)
Lymphs: 24 %
MCH: 31.3 pg (ref 26.6–33.0)
MCHC: 35.2 g/dL (ref 31.5–35.7)
MCV: 89 fL (ref 79–97)
Monocytes Absolute: 1 x10E3/uL — ABNORMAL HIGH (ref 0.1–0.9)
Monocytes: 8 %
Neutrophils Absolute: 8.1 x10E3/uL — ABNORMAL HIGH (ref 1.4–7.0)
Neutrophils: 62 %
Platelets: 277 x10E3/uL (ref 150–450)
RBC: 4.64 x10E6/uL (ref 3.77–5.28)
RDW: 12.7 % (ref 11.7–15.4)
WBC: 12.8 x10E3/uL — ABNORMAL HIGH (ref 3.4–10.8)

## 2019-04-25 ENCOUNTER — Telehealth: Payer: Self-pay | Admitting: *Deleted

## 2019-04-25 NOTE — Telephone Encounter (Signed)
Patient is scheduled for lab study on 05/03/19. Pt is scheduled for COVID screening on 04/30/19/ 11:45 prior to SS. Patient understands her sleep study will be done at [facility] sleep lab. Patient understands she will receive a sleep packet in a week or so. Patient understands to call if she does not receive the sleep packet in a timely manner. Patient agrees with treatment and thanked me for call.

## 2019-04-25 NOTE — Telephone Encounter (Signed)
-----   Message from Gaynelle Cage, CMA sent at 04/19/2019  3:45 PM EST ----- Regarding: RE: sleep study Per Christel Mormon C with [employer] no PA is required. Call reference [id number]. Ok to schedule sleep study. ----- Message ----- From: Shona Simpson, RN Sent: 03/08/2019  11:06 AM EST To: Cv Div Sleep Studies Subject: sleep study                                    Pt needs sleep study for af, snoring, daytime somnolence per ricky fenton Thanks [address]

## 2019-04-26 ENCOUNTER — Telehealth: Payer: Self-pay | Admitting: Nurse Practitioner

## 2019-04-26 NOTE — Telephone Encounter (Signed)
Patient returning Danielle's call.  

## 2019-04-26 NOTE — Telephone Encounter (Signed)
Follow Up:      Returning your call from today. 

## 2019-04-26 NOTE — Telephone Encounter (Signed)
Patient returning Danielle's call in regards to lab results. °

## 2019-04-30 ENCOUNTER — Other Ambulatory Visit (HOSPITAL_COMMUNITY)
Admission: RE | Admit: 2019-04-30 | Discharge: 2019-04-30 | Disposition: A | Discharge status: Home / Self Care | Payer: Commercial Managed Care - PPO | Source: Ambulatory Visit | Attending: Cardiology | Admitting: Cardiology

## 2019-04-30 DIAGNOSIS — Z01812 Encounter for preprocedural laboratory examination: Secondary | ICD-10-CM | POA: Diagnosis present

## 2019-04-30 DIAGNOSIS — Z20822 Contact with and (suspected) exposure to covid-19: Secondary | ICD-10-CM | POA: Insufficient documentation

## 2019-04-30 LAB — SARS CORONAVIRUS 2 (TAT 6-24 HRS): SARS Coronavirus 2: NEGATIVE

## 2019-05-03 ENCOUNTER — Ambulatory Visit (HOSPITAL_BASED_OUTPATIENT_CLINIC_OR_DEPARTMENT_OTHER): Payer: Commercial Managed Care - PPO | Attending: Physician Assistant | Admitting: Cardiology

## 2019-05-03 ENCOUNTER — Other Ambulatory Visit: Payer: Self-pay

## 2019-05-03 DIAGNOSIS — G4733 Obstructive sleep apnea (adult) (pediatric): Secondary | ICD-10-CM | POA: Diagnosis not present

## 2019-05-03 DIAGNOSIS — Z6832 Body mass index (BMI) 32.0-32.9, adult: Secondary | ICD-10-CM | POA: Diagnosis not present

## 2019-05-03 DIAGNOSIS — R0902 Hypoxemia: Secondary | ICD-10-CM | POA: Diagnosis not present

## 2019-05-03 DIAGNOSIS — G4734 Idiopathic sleep related nonobstructive alveolar hypoventilation: Secondary | ICD-10-CM | POA: Diagnosis not present

## 2019-05-03 DIAGNOSIS — E669 Obesity, unspecified: Secondary | ICD-10-CM | POA: Diagnosis not present

## 2019-05-03 DIAGNOSIS — R0683 Snoring: Secondary | ICD-10-CM | POA: Diagnosis not present

## 2019-05-03 DIAGNOSIS — I10 Essential (primary) hypertension: Secondary | ICD-10-CM | POA: Insufficient documentation

## 2019-05-04 ENCOUNTER — Telehealth: Payer: Self-pay | Admitting: *Deleted

## 2019-05-04 NOTE — Telephone Encounter (Signed)
-----   Message from Traci R Turner, MD sent at 05/04/2019 10:18 AM EST ----- Please let patient know that they have sleep apnea and recommend CPAP titration. Please set up titration in the sleep lab.  

## 2019-05-04 NOTE — Procedures (Signed)
   Patient Name: Kari Harrison, Kari Harrison Date: 05/03/2019 Gender: Female D.O.B: 13-Feb-1956 Age (years): 69 Referring Provider: Alphonzo Severance PA Height (inches): 62 Interpreting Physician: Armanda Magic MD, ABSM Weight (lbs): 173 RPSGT: Ulyess Mort BMI: 32 MRN: [id number] Neck Size: 13.50  CLINICAL INFORMATION Sleep Study Type: NPSG  Indication for sleep study: Hypertension, Obesity, Snoring  SLEEP STUDY TECHNIQUE As per the AASM Manual for the Scoring of Sleep and Associated Events v2.3 (April 2016) with a hypopnea requiring 4% desaturations.  The channels recorded and monitored were frontal, central and occipital EEG, electrooculogram (EOG), submentalis EMG (chin), nasal and oral airflow, thoracic and abdominal wall motion, anterior tibialis EMG, snore microphone, electrocardiogram, and pulse oximetry.  MEDICATIONS Medications self-administered by patient taken the night of the study : N/A  SLEEP ARCHITECTURE The study was initiated at 10:33:48 PM and ended at 4:36:31 AM.  Sleep onset time was 19.6 minutes and the sleep efficiency was 88.2%. The total sleep time was 320 minutes.  Stage REM latency was 214.0 minutes.  The patient spent 12.2% of the night in stage N1 sleep, 74.8% in stage N2 sleep, 0.0% in stage N3 and 13% in REM.  Alpha intrusion was absent.  Supine sleep was 50.94%.  RESPIRATORY PARAMETERS The overall apnea/hypopnea index (AHI) was 34.1 per hour. There were 0 total apneas, including 0 obstructive, 0 central and 0 mixed apneas. There were 182 hypopneas and 56 RERAs.  The AHI during Stage REM sleep was 53.5 per hour.  AHI while supine was 63.3 per hour.  The mean oxygen saturation was 89.5%. The minimum SpO2 during sleep was 84.0%.  moderate snoring was noted during this study.  CARDIAC DATA The 2 lead EKG demonstrated sinus rhythm. The mean heart rate was 64.9 beats per minute. Other EKG findings include: PACs and PVCs.  LEG MOVEMENT DATA The total  PLMS were 0 with a resulting PLMS index of 0.0. Associated arousal with leg movement index was 0.0 .  IMPRESSIONS - Moderate obstructive sleep apnea occurred during this study (AHI = 34.1/h). - No significant central sleep apnea occurred during this study (CAI = 0.0/h). - Mild oxygen desaturation was noted during this study (Min O2 = 84.0%). - The patient snored with moderate snoring volume. - PACs and PVCs  were noted during this study. - Clinically significant periodic limb movements did not occur during sleep. No significant associated arousals.  DIAGNOSIS - Obstructive Sleep Apnea (327.23 [G47.33 ICD-10]) - Nocturnal Hypoxemia (327.26 [G47.36 ICD-10])  RECOMMENDATIONS - Therapeutic CPAP titration to determine optimal pressure required to alleviate sleep disordered breathing. - Positional therapy avoiding supine position during sleep. - Avoid alcohol, sedatives and other CNS depressants that may worsen sleep apnea and disrupt normal sleep architecture. - Sleep hygiene should be reviewed to assess factors that may improve sleep quality. - Weight management and regular exercise should be initiated or continued if appropriate.  [Electronically signed] 05/04/2019 10:15 AM  Armanda Magic MD, ABSM Diplomate, American Board of Sleep Medicine

## 2019-05-04 NOTE — Telephone Encounter (Signed)
Informed patient of sleep study results and patient understanding was verbalized. Patient understands her sleep study showed Patient understands her sleep study showed they have sleep apnea and recommend CPAP titration. Please set up titration in the sleep lab.  Pt is aware and agreeable to her results.  Titration sent to sleep pool.

## 2019-05-04 NOTE — Telephone Encounter (Signed)
-----   Message from Quintella Reichert, MD sent at 05/04/2019 10:18 AM EST ----- Please let patient know that they have sleep apnea and recommend CPAP titration. Please set up titration in the sleep lab.

## 2019-05-12 ENCOUNTER — Telehealth: Payer: Self-pay | Admitting: *Deleted

## 2019-05-12 DIAGNOSIS — R29818 Other symptoms and signs involving the nervous system: Secondary | ICD-10-CM

## 2019-05-12 NOTE — Telephone Encounter (Signed)
-----   Message from Gaynelle Cage, CMA sent at 05/05/2019  8:21 AM EST ----- Regarding: RE: precert No PA is required for in lab sleep study. Ok to schedule. ----- Message ----- From: Reesa Chew, CMA Sent: 05/04/2019  12:02 PM EST To: Cv Div Sleep Studies Subject: precert                                        recommend CPAP titration

## 2019-05-18 NOTE — Telephone Encounter (Addendum)
Patient is scheduled for CPAP Titration on 06/25/19. Patient is scheduled for COVID screening on 06/23/19 2:50 prior to titration.  Patient understands her titration study will be done at [facility] sleep lab. Patient understands she will receive a letter in a week or so detailing appointment, date, time, and location. Patient understands to call if she does not receive the letter  in a timely manner. Patient agrees with treatment and thanked me for call.

## 2019-05-26 ENCOUNTER — Other Ambulatory Visit (HOSPITAL_COMMUNITY): Payer: Commercial Managed Care - PPO

## 2019-05-29 ENCOUNTER — Encounter (HOSPITAL_BASED_OUTPATIENT_CLINIC_OR_DEPARTMENT_OTHER): Payer: Commercial Managed Care - PPO | Admitting: Cardiology

## 2019-06-10 ENCOUNTER — Ambulatory Visit (HOSPITAL_COMMUNITY): Payer: 59 | Admitting: Physician Assistant

## 2019-06-23 ENCOUNTER — Other Ambulatory Visit (HOSPITAL_COMMUNITY)
Admission: RE | Admit: 2019-06-23 | Discharge: 2019-06-23 | Disposition: A | Discharge status: Home / Self Care | Payer: 59 | Source: Ambulatory Visit | Attending: Cardiology | Admitting: Cardiology

## 2019-06-23 DIAGNOSIS — Z20822 Contact with and (suspected) exposure to covid-19: Secondary | ICD-10-CM | POA: Insufficient documentation

## 2019-06-23 DIAGNOSIS — Z01812 Encounter for preprocedural laboratory examination: Secondary | ICD-10-CM | POA: Insufficient documentation

## 2019-06-23 LAB — SARS CORONAVIRUS 2 (TAT 6-24 HRS): SARS Coronavirus 2: NEGATIVE

## 2019-06-25 ENCOUNTER — Other Ambulatory Visit: Payer: Self-pay

## 2019-06-25 ENCOUNTER — Ambulatory Visit (HOSPITAL_BASED_OUTPATIENT_CLINIC_OR_DEPARTMENT_OTHER): Payer: 59 | Attending: Nurse Practitioner | Admitting: Cardiology

## 2019-06-25 VITALS — Ht 61.5 in | Wt 173.0 lb

## 2019-06-25 DIAGNOSIS — R29818 Other symptoms and signs involving the nervous system: Secondary | ICD-10-CM | POA: Diagnosis not present

## 2019-06-25 DIAGNOSIS — G4733 Obstructive sleep apnea (adult) (pediatric): Secondary | ICD-10-CM | POA: Diagnosis not present

## 2019-06-27 ENCOUNTER — Other Ambulatory Visit: Payer: Self-pay

## 2019-07-04 NOTE — Procedures (Signed)
    Patient Name: Kari Harrison, Kari Harrison Date: 06/25/2019 Gender: Female D.O.B: 07-04-1955 Age (years): 63 Referring Provider: Norma Fredrickson Height (inches): 62 Interpreting Physician: Armanda Magic MD, ABSM Weight (lbs): 173 RPSGT: Rolene Arbour BMI: 32 MRN: [id number] Neck Size: 13.50  CLINICAL INFORMATION The patient is referred for a CPAP titration to treat sleep apnea.  SLEEP STUDY TECHNIQUE As per the AASM Manual for the Scoring of Sleep and Associated Events v2.3 (April 2016) with a hypopnea requiring 4% desaturations.  The channels recorded and monitored were frontal, central and occipital EEG, electrooculogram (EOG), submentalis EMG (chin), nasal and oral airflow, thoracic and abdominal wall motion, anterior tibialis EMG, snore microphone, electrocardiogram, and pulse oximetry. Continuous positive airway pressure (CPAP) was initiated at the beginning of the study and titrated to treat sleep-disordered breathing.  MEDICATIONS Medications self-administered by patient taken the night of the study : N/A  TECHNICIAN COMMENTS Comments added by technician: NONE Comments added by scorer: N/A RESPIRATORY PARAMETERS Optimal PAP Pressure (cm): 15  AHI at Optimal Pressure (/hr):0.5 Overall Minimal O2 (%):87.0  Supine % at Optimal Pressure (%):10 Minimal O2 at Optimal Pressure (%): 89.0   SLEEP ARCHITECTURE The study was initiated at 9:49:09 PM and ended at 4:36:22 AM.  Sleep onset time was 21.9 minutes and the sleep efficiency was 88.6%. The total sleep time was 360.8 minutes.  The patient spent 1.7% of the night in stage N1 sleep, 78.7% in stage N2 sleep, 0.0% in stage N3 and 19.7% in REM.Stage REM latency was 149.0 minutes  Wake after sleep onset was 24.5. Alpha intrusion was absent. Supine sleep was 24.91%.  CARDIAC DATA The 2 lead EKG demonstrated sinus rhythm. The mean heart rate was 61.4 beats per minute. Other EKG findings include: PSVT  LEG MOVEMENT DATA The total  Periodic Limb Movements of Sleep (PLMS) were 0. The PLMS index was 0.0. A PLMS index of <15 is considered normal in adults.  IMPRESSIONS - The optimal PAP pressure was 15 cm of water. - Central sleep apnea was not noted during this titration (CAI = 0.2/h). - Mild oxygen desaturations were observed during this titration (min O2 = 87.0%). - The patient snored with soft snoring volume during this titration study. - PSVT observed during this study. - Clinically significant periodic limb movements were not noted during this study. Arousals associated with PLMs were rare.  DIAGNOSIS - Obstructive Sleep Apnea (327.23 [G47.33 ICD-10])  RECOMMENDATIONS - Trial of CPAP therapy on 15 cm H2O with a Small size Fisher&Paykel Full Face Mask Simplus mask and heated humidification. - Avoid alcohol, sedatives and other CNS depressants that may worsen sleep apnea and disrupt normal sleep architecture. - Sleep hygiene should be reviewed to assess factors that may improve sleep quality. - Weight management and regular exercise should be initiated or continued. - Return to Sleep Center for re-evaluation after 8 weeks of therapy  [Electronically signed] 07/04/2019 02:49 PM  Armanda Magic MD, ABSM Diplomate, American Board of Sleep Medicine

## 2019-07-05 ENCOUNTER — Telehealth: Payer: Self-pay | Admitting: *Deleted

## 2019-07-05 NOTE — Telephone Encounter (Signed)
Informed patient of sleep study results and patient understanding was verbalized. Patient understands her sleep study showed they had a successful PAP titration and let DME know that orders are in EPIC. Upon patient request DME selection is [facility]. Patient understands she will be contacted by [facility] to set up her cpap. Patient understands to call if [facility] does not contact her with new setup in a timely manner. Patient understands they will be called once confirmation has been received from [facility] that they have received their new machine to schedule 10 week follow up appointment.  [facility] notified of new cpap order  Please add to airview Patient was grateful for the call and thanked me.

## 2019-07-05 NOTE — Telephone Encounter (Signed)
-----   Message from Quintella Reichert, MD sent at 07/04/2019  2:52 PM EDT ----- Please let patient know that they had a successful PAP titration and let DME know that orders are in EPIC.  Please set up 8 week OV with me.

## 2019-07-08 ENCOUNTER — Ambulatory Visit (HOSPITAL_COMMUNITY)
Admission: RE | Admit: 2019-07-08 | Discharge: 2019-07-08 | Disposition: A | Discharge status: Home / Self Care | Payer: 59 | Source: Ambulatory Visit | Attending: Physician Assistant | Admitting: Physician Assistant

## 2019-07-08 ENCOUNTER — Encounter (HOSPITAL_COMMUNITY): Payer: Self-pay | Admitting: Physician Assistant

## 2019-07-08 ENCOUNTER — Other Ambulatory Visit: Payer: Self-pay

## 2019-07-08 VITALS — BP 128/82 | HR 56 | Ht 61.5 in | Wt 171.6 lb

## 2019-07-08 DIAGNOSIS — G4733 Obstructive sleep apnea (adult) (pediatric): Secondary | ICD-10-CM | POA: Insufficient documentation

## 2019-07-08 DIAGNOSIS — I480 Paroxysmal atrial fibrillation: Secondary | ICD-10-CM | POA: Diagnosis present

## 2019-07-08 DIAGNOSIS — Z6831 Body mass index (BMI) 31.0-31.9, adult: Secondary | ICD-10-CM | POA: Diagnosis not present

## 2019-07-08 DIAGNOSIS — I10 Essential (primary) hypertension: Secondary | ICD-10-CM | POA: Insufficient documentation

## 2019-07-08 DIAGNOSIS — E669 Obesity, unspecified: Secondary | ICD-10-CM | POA: Insufficient documentation

## 2019-07-08 DIAGNOSIS — D6869 Other thrombophilia: Secondary | ICD-10-CM

## 2019-07-08 DIAGNOSIS — I2510 Atherosclerotic heart disease of native coronary artery without angina pectoris: Secondary | ICD-10-CM | POA: Insufficient documentation

## 2019-07-08 NOTE — Progress Notes (Signed)
Primary Care Physician: Sigmund Hazel, MD Primary Cardiologist: Dr McLean/Dr Elease Hashimoto Primary Electrophysiologist: none Referring Physician: Dr Jonna Munro is a 64 y.o. female with a history of hypertension, hyperlipidemia, coronary artery disease, tobacco abuse and paroxysmal atrial fibrillation who presents for follow up in the [facility].  The patient was initially diagnosed with atrial fibrillation 02/20/19 after presenting to the ER with symptoms of tachy palpitations and was found to be in afib with RVR. She was admitted and rate controlled on diltiazem drip. She was started on Eliquis for a CHADS2VASC score of 3. A TEE/DCCV was planned but she converted to SR on her own. She denies any alcohol use.   On follow up today, patient reports doing well since her last visit. She did have one episode of palpitations in the middle of the night which resolved spontaneously in about one hour. She was diagnosed with OSA and has undergone CPAP titration. She denies any bleeding issues with anticoagulation.   Today, she denies symptoms of chest pain, shortness of breath, orthopnea, PND, lower extremity edema, dizziness, presyncope, syncope, bleeding, or neurologic sequela. The patient is tolerating medications without difficulties and is otherwise without complaint today.    Atrial Fibrillation Risk Factors:  she does have symptoms or diagnosis of sleep apnea. she does not have a history of rheumatic fever. she does not have a history of alcohol use. The patient does not have a history of early familial atrial fibrillation or other arrhythmias.  she has a BMI of Body mass index is 31.9 kg/m.Marland Kitchen Filed Weights   07/08/19 0937  Weight: 77.8 kg    Family History  Problem Relation Age of Onset  . Arthritis Mother   . Macular degeneration Mother   . Heart attack Father   . Hypertension Sister   . Atrial fibrillation Neg Hx      Atrial Fibrillation  Management history:  Previous antiarrhythmic drugs: none Previous cardioversions: none Previous ablations: none CHADS2VASC score: 3 Anticoagulation history: Eliquis   Past Medical History:  Diagnosis Date  . CAD (coronary artery disease)    a. Cardiac CTA: severe 3VD;  b. 05/2014 Cath/PCI: LM nl, LAD 50-60p/m, 22m, D1 50ost/m, RI small/plaque, LCX dominant 20-30p, 8m, 60/90d (2.25x24 Promus DES), OM2 40-50m, RCA nondom, mod diff plaque, EF 55%.  Marland Kitchen Hyperlipidemia   . Hypertension   . Pulmonary nodules    a. 05/2014 CTA: left lung pulm nodules w/ rec for 3 month f/u noncontrast CT.  . Tobacco abuse    Past Surgical History:  Procedure Laterality Date  . CESAREAN SECTION    . LEFT HEART CATHETERIZATION WITH CORONARY ANGIOGRAM N/A 06/16/2014   Procedure: LEFT HEART CATHETERIZATION WITH CORONARY ANGIOGRAM;  Surgeon: Dolores Patty, MD;  Location: [facility] CATH LAB;  Service: Cardiovascular;  Laterality: N/A;    Current Outpatient Medications  Medication Sig Dispense Refill  . apixaban (ELIQUIS) 5 MG TABS tablet Take 1 tablet (5 mg total) by mouth 2 (two) times daily. 60 tablet 0  . Ascorbic Acid (VITAMIN C) 100 MG tablet Take 100 mg by mouth daily.    Marland Kitchen aspirin 81 MG tablet Take 1 tablet (81 mg total) by mouth daily. 30 tablet   . atenolol (TENORMIN) 50 MG tablet Take 50 mg by mouth daily.  0  . atorvastatin (LIPITOR) 80 MG tablet take 1 tablet by mouth once daily ; MAKE DOCTOR'S APPOINTMENT ! 15 tablet 0  . CALCIUM PO Take 1 tablet  by mouth daily.    . cholecalciferol (VITAMIN D3) 25 MCG (1000 UT) tablet Take 1,000 Units by mouth daily.    Marland Kitchen lisinopril-hydrochlorothiazide (PRINZIDE,ZESTORETIC) 20-12.5 MG per tablet Take 2 tablets by mouth daily.  0  . loratadine (CLARITIN) 10 MG tablet Take 10 mg by mouth daily.    . Multiple Vitamins-Minerals (ZINC PO) Take 1 tablet by mouth daily.    Marland Kitchen omeprazole (PRILOSEC) 20 MG capsule Take 20 mg by mouth daily.    Marland Kitchen venlafaxine XR (EFFEXOR-XR) 75  MG 24 hr capsule Take 75 mg by mouth daily.  0   No current facility-administered medications for this encounter.    No Known Allergies  Social History   Socioeconomic History  . Marital status: Married    Spouse name: Not on file  . Number of children: Not on file  . Years of education: Not on file  . Highest education level: Not on file  Occupational History  . Occupation: Chartered certified accountant  Tobacco Use  . Smoking status: Current Every Day Smoker    Packs/day: 0.02    Years: 24.00    Pack years: 0.48    Types: Cigarettes  . Smokeless tobacco: Never Used  . Tobacco comment: denies need for a patch/ less than 6 a day  Substance and Sexual Activity  . Alcohol use: No    Alcohol/week: 0.0 standard drinks  . Drug use: Not Currently    Types: Marijuana    Comment: occasional use  . Sexual activity: Not on file  Other Topics Concern  . Not on file  Social History Narrative  . Not on file   Social Determinants of Health   Financial Resource Strain:   . Difficulty of Paying Living Expenses:   Food Insecurity:   . Worried About Charity fundraiser in the Last Year:   . Arboriculturist in the Last Year:   Transportation Needs:   . Film/video editor (Medical):   Marland Kitchen Lack of Transportation (Non-Medical):   Physical Activity:   . Days of Exercise per Week:   . Minutes of Exercise per Session:   Stress:   . Feeling of Stress :   Social Connections:   . Frequency of Communication with Friends and Family:   . Frequency of Social Gatherings with Friends and Family:   . Attends Religious Services:   . Active Member of Clubs or Organizations:   . Attends Archivist Meetings:   Marland Kitchen Marital Status:   Intimate Partner Violence:   . Fear of Current or Ex-Partner:   . Emotionally Abused:   Marland Kitchen Physically Abused:   . Sexually Abused:      ROS- All systems are reviewed and negative except as per the HPI above.  Physical Exam: Vitals:   07/08/19 0937  BP: 128/82    Pulse: (!) 56  Weight: 77.8 kg  Height: 5' 1.5" (1.562 m)    GEN- The patient is well appearing obese female, alert and oriented x 3 today.   HEENT-head normocephalic, atraumatic, sclera clear, conjunctiva pink, hearing intact, trachea midline. Lungs- Clear to ausculation bilaterally, normal work of breathing Heart- Regular rate and rhythm, no murmurs, rubs or gallops  GI- soft, NT, ND, + BS Extremities- no clubbing, cyanosis, or edema MS- no significant deformity or atrophy Skin- no rash or lesion Psych- euthymic mood, full affect Neuro- strength and sensation are intact   Wt Readings from Last 3 Encounters:  07/08/19 77.8 kg  06/25/19 78.5  kg  05/03/19 78.5 kg    EKG today demonstrates SB HR 56, NST, PR 168, QRS 74, QTc 391  Echo 02/20/19 demonstrated  1. Left ventricular ejection fraction, by visual estimation, is 60 to 65%. The left ventricle has normal function. There is mildly increased left ventricular hypertrophy.  2. Left ventricular diastolic parameters are indeterminate.  3. Global right ventricle has normal systolic function.The right ventricular size is normal. No increase in right ventricular wall thickness.  4. Left atrial size was normal.  5. Right atrial size was normal.  6. Moderate aortic valve annular calcification.  7. The mitral valve is normal in structure. No evidence of mitral valve regurgitation. No evidence of mitral stenosis.  8. The tricuspid valve is normal in structure. Tricuspid valve regurgitation is not demonstrated.  9. The aortic valve was not well visualized. Aortic valve regurgitation is not visualized. No evidence of aortic valve sclerosis or stenosis. 10. There is Moderate calcification of the aortic valve. 11. There is Moderate thickening of the aortic valve. 12. The pulmonic valve was not well visualized. Pulmonic valve regurgitation is not visualized. 13. The inferior vena cava is normal in size with greater than 50% respiratory  variability, suggesting right atrial pressure of 3 mmHg.   Epic records are reviewed at length today  Assessment and Plan:  1. Paroxysmal atrial fibrillation Patient appears to be maintaining SR. Continue atenolol 50 mg daily.  We discussed possibility for AAD in the future if her afib should become more persistent. Would avoid class 1C given CAD. Continue Eliquis 5 mg BID  This patients CHA2DS2-VASc Score and unadjusted Ischemic Stroke Rate (% per year) is equal to 3.2 % stroke rate/year from a score of 3  Above score calculated as 1 point each if present [CHF, HTN, DM, Vascular=MI/PAD/Aortic Plaque, Age if 65-74, or Female] Above score calculated as 2 points each if present [Age > 75, or Stroke/TIA/TE]  2. Obesity Body mass index is 31.9 kg/m. Lifestyle modification was discussed and encouraged including regular physical activity and weight reduction.  3. OSA The importance of adequate treatment of sleep apnea was discussed today in order to improve our ability to maintain sinus rhythm long term. Patient has undergone CPAP titration and is waiting for her machine to be delivered.   4. CAD S/p PCI in 2016. Low risk myoview 04/08/19 No anginal symptoms.  5. HTN Stable, no changes today.   Follow up with Norma Fredrickson NP as scheduled. AF clinic as needed.    Jorja Loa PA-C Af[facility] [address] [place], [place] [address] [phone] 07/08/2019 10:29 AM

## 2019-08-31 NOTE — Progress Notes (Signed)
CARDIOLOGY OFFICE NOTE  Date:  09/12/2019    Katy Apo Date of Birth: 04/23/1955 Medical Record #[id number]  PCP:  Sigmund Hazel, MD  Cardiologist:  Tyrone Sage    Chief Complaint  Patient presents with  . Follow-up    History of Present Illness: Kari Harrison is a 64 y.o. female who presents today for a 6 month check. Former patient of Dr. Alford Highland.   She has a history of known CAD, HTN, tobacco abuse, lung nodules and HLD.  She has had prior cardiac CT showing severe 3 VD - with subsequent PCI of the LCX in 2016.  I last saw her in 2016 - doing ok but still smoking. Lots of stress with her family/work.   Presented to the hospital late last December with palpitations - found to have AF - she converted - no cardioversion. She had follow up in the AF clinic and was also to follow back up here. She is on anticoagulation now. Stress testing was done in January and was low risk. At last visit right before Christmas - she was doing well  Weaning off [facility]. To have sleep study. Smoking less. Her white count was elevated - ended up having an abscessed tooth.   Seen in the AF clinic back in April. To go back prn.   The patient does not have symptoms concerning for COVID-19 infection (fever, chills, cough, or new shortness of breath).   Comes in today. Here alone. Still trying to get her CPAP - she has sleep apnea - just having lots of insurance issues with getting the CPAP machine. BP is up here today. She does not really check at home - has just had her medicines. She has been vaccinated. No chest pain. Breathing is good. Still smoking a few cigarettes. No AF that she is aware of. Tolerating her Eliquis. No bleeding/bruising.   Past Medical History:  Diagnosis Date  . CAD (coronary artery disease)    a. Cardiac CTA: severe 3VD;  b. 05/2014 Cath/PCI: LM nl, LAD 50-60p/m, 11m, D1 50ost/m, RI small/plaque, LCX dominant 20-30p, 47m, 60/90d (2.25x24 Promus DES), OM2  40-21m, RCA nondom, mod diff plaque, EF 55%.  Marland Kitchen Hyperlipidemia   . Hypertension   . Pulmonary nodules    a. 05/2014 CTA: left lung pulm nodules w/ rec for 3 month f/u noncontrast CT.  . Tobacco abuse     Past Surgical History:  Procedure Laterality Date  . CESAREAN SECTION    . LEFT HEART CATHETERIZATION WITH CORONARY ANGIOGRAM N/A 06/16/2014   Procedure: LEFT HEART CATHETERIZATION WITH CORONARY ANGIOGRAM;  Surgeon: Dolores Patty, MD;  Location: [facility] CATH LAB;  Service: Cardiovascular;  Laterality: N/A;     Medications: Current Meds  Medication Sig  . Ascorbic Acid (VITAMIN C) 100 MG tablet Take 100 mg by mouth daily.  Marland Kitchen aspirin 81 MG tablet Take 1 tablet (81 mg total) by mouth daily.  Marland Kitchen atenolol (TENORMIN) 50 MG tablet Take 50 mg by mouth daily.  Marland Kitchen atorvastatin (LIPITOR) 80 MG tablet take 1 tablet by mouth once daily ; MAKE DOCTOR'S APPOINTMENT !  . CALCIUM PO Take 1 tablet by mouth daily.  . cholecalciferol (VITAMIN D3) 25 MCG (1000 UT) tablet Take 1,000 Units by mouth daily.  Marland Kitchen lisinopril-hydrochlorothiazide (PRINZIDE,ZESTORETIC) 20-12.5 MG per tablet Take 2 tablets by mouth daily.  Marland Kitchen loratadine (CLARITIN) 10 MG tablet Take 10 mg by mouth daily.  . Multiple Vitamins-Minerals (ZINC PO) Take 1 tablet by mouth  daily.  . omeprazole (PRILOSEC) 20 MG capsule Take 20 mg by mouth daily.  Marland Kitchen venlafaxine XR (EFFEXOR-XR) 75 MG 24 hr capsule Take 75 mg by mouth daily.     Allergies: No Known Allergies  Social History: The patient  reports that she has been smoking cigarettes. She has a 0.48 pack-year smoking history. She has never used smokeless tobacco. She reports previous drug use. Drug: Marijuana. She reports that she does not drink alcohol.   Family History: The patient's family history includes Arthritis in her mother; Heart attack in her father; Hypertension in her sister; Macular degeneration in her mother.   Review of Systems: Please see the history of present illness.    All other systems are reviewed and negative.   Physical Exam: VS:  BP (!) 148/98   Pulse 60   Ht 5' 1.5" (1.562 m)   Wt 168 lb 6.4 oz (76.4 kg)   SpO2 96%   BMI 31.30 kg/m  .  BMI Body mass index is 31.3 kg/m.  Wt Readings from Last 3 Encounters:  09/12/19 168 lb 6.4 oz (76.4 kg)  07/08/19 171 lb 9.6 oz (77.8 kg)  06/25/19 173 lb (78.5 kg)    General: Pleasant. Well developed, well nourished and in no acute distress. Her weight is down a few pounds.   Cardiac: Regular rate and rhythm. No murmurs, rubs, or gallops. No edema.  Respiratory:  Lungs are clear to auscultation bilaterally with normal work of breathing.  GI: Soft and nontender.  MS: No deformity or atrophy. Gait and ROM intact.  Skin: Warm and dry. Color is normal.  Neuro:  Strength and sensation are intact and no gross focal deficits noted.  Psych: Alert, appropriate and with normal affect.   LABORATORY DATA:  EKG:  EKG is not ordered today.    Lab Results  Component Value Date   WBC 12.8 (H) 04/22/2019   HGB 14.5 04/22/2019   HCT 41.2 04/22/2019   PLT 277 04/22/2019   GLUCOSE 91 03/23/2019   CHOL 134 02/21/2019   TRIG 135 02/21/2019   HDL 33 (L) 02/21/2019   LDLCALC 74 02/21/2019   ALT 43 02/22/2019   AST 33 02/22/2019   NA 141 03/23/2019   K 3.7 03/23/2019   CL 102 03/23/2019   CREATININE 0.89 03/23/2019   BUN 12 03/23/2019   CO2 25 03/23/2019   TSH 4.492 02/20/2019   INR 0.98 06/15/2014   HGBA1C 6.1 (H) 02/20/2019     BNP (last 3 results) No results for input(s): BNP in the last 8760 hours.  ProBNP (last 3 results) No results for input(s): PROBNP in the last 8760 hours.   Other Studies Reviewed Today:  Myoview Study Highlights 04/2019   Nuclear stress EF: 61%.  EKG nondiagnostic due to baseline changes  Normal perfusion. No ischemia or scar  This is a low risk study.      Echocardiogram 02/20/2019: 1. Left ventricular ejection fraction, by visual estimation, is 60 to 65%. The  left ventricle has normal function. There is mildly increased left ventricular hypertrophy. 2. Left ventricular diastolic parameters are indeterminate. 3. Global right ventricle has normal systolic function.The right ventricular size is normal. No increase in right ventricular wall thickness. 4. Left atrial size was normal. 5. Right atrial size was normal. 6. Moderate aortic valve annular calcification. 7. The mitral valve is normal in structure. No evidence of mitral valve regurgitation. No evidence of mitral stenosis. 8. The tricuspid valve is normal in structure. Tricuspid  valve regurgitation is not demonstrated. 9. The aortic valve was not well visualized. Aortic valve regurgitation is not visualized. No evidence of aortic valve sclerosis or stenosis. 10. There is Moderate calcification of the aortic valve. 11. There is Moderate thickening of the aortic valve. 12. The pulmonic valve was not well visualized. Pulmonic valve regurgitation is not visualized. 13. The inferior vena cava is normal in size with greater than 50% respiratory variability, suggesting right atrial pressure of 3 mmHg.   PROCEDURE 2016: Cath/PCI distal left circumflex. Left main: Mild calcification. No significant stenosis.   LAD: Long vessel wraps apex. 50-60% lesion in proximal to mid LAD prior to take-off of first diagonal. There is a 40% lesion in the mid LAD just after the D1 take-off. Mild plaque in distal LAD. In large D1 there is 50% lesion ostially and 50% lesion in midsection.  CHY:IFOYD ramus with moderate ostial plaque. Large dominant circumflex. Gives off large branching OM-1. 3 PLS and a PDA. In the proximal LCX there is a 20-30% stenosis. In the mid LCX there is a 50% lesion just after the take-off of the OM-1. In the distal LCX there is a 60% lesion after the first PL and a 90% lesion prior to the 2nd PL. In the large OM-1 there is a 40-50% mid lesion   RCA: Moderate sized non-dominant  vessel with moderate diffuse plaque.  LV-gram done in the RAO projection: Ejection fraction = 55% nor regional wall motion abnormalities   IMPRESSIONS:  1. Successful PCI of the distal left circumflex artery with a 2.25 x 24 Promus drug-eluting stent, postdilated to greater than 2.5 mm in diameter. 2. Unsuccessful attempt at diagnostic cardiac cath from the right radial approach due to radial loop.  RECOMMENDATION: Continue dual antiplatelet therapy for at least a year. Continue aggressive secondary prevention.     Assessment/Plan:  1. PAF - CHADSVASC of at least 3 - on chronic anticoagulation - was to have sleep study arranged thru AF clinic - now trying to get her CPAP machine. Remains in NSR by exam today. No changes made today.   2. HTN - recheck by me is 130/90 - I have asked her to monitor. I have left her on her current dose of Zestoretic for now and Tenormin.   3. Tobacco abuse - total cessation encouraged.   4. CAD - prior PCI/DES to LCX - low risk Myoview from January -   5. HLD - rechecking lab today.  6. Borderline DM - per PCP - she has lost a few pounds.   7. COVID-19 Education: The signs and symptoms of COVID-19 were discussed with the patient and how to seek care for testing (follow up with PCP or arrange E-visit).  The importance of social distancing, staying at home, hand hygiene and wearing a mask when out in public were discussed today.  Current medicines are reviewed with the patient today.  The patient does not have concerns regarding medicines other than what has been noted above.  The following changes have been made:  See above.  Labs/ tests ordered today include:    Orders Placed This Encounter  Procedures  . Basic metabolic panel  . CBC  . Hepatic function panel  . Lipid panel     Disposition:   FU with me in 6 months; labs today - overall felt to be doing ok. Monitor BP at home.     Patient is agreeable to this plan and  will call if any problems develop  in the interim.   SignedTruitt Merle, NP  09/12/2019 8:42 AM  [facility] [address] [place], [place]  [address] Phone: [phone] Fax: [phone]

## 2019-09-12 ENCOUNTER — Ambulatory Visit (INDEPENDENT_AMBULATORY_CARE_PROVIDER_SITE_OTHER): Payer: 59 | Admitting: Nurse Practitioner

## 2019-09-12 ENCOUNTER — Encounter: Payer: Self-pay | Admitting: Nurse Practitioner

## 2019-09-12 ENCOUNTER — Other Ambulatory Visit: Payer: Self-pay

## 2019-09-12 VITALS — BP 148/98 | HR 60 | Ht 61.5 in | Wt 168.4 lb

## 2019-09-12 DIAGNOSIS — Z7901 Long term (current) use of anticoagulants: Secondary | ICD-10-CM | POA: Diagnosis not present

## 2019-09-12 DIAGNOSIS — I480 Paroxysmal atrial fibrillation: Secondary | ICD-10-CM | POA: Diagnosis not present

## 2019-09-12 DIAGNOSIS — I259 Chronic ischemic heart disease, unspecified: Secondary | ICD-10-CM

## 2019-09-12 DIAGNOSIS — E782 Mixed hyperlipidemia: Secondary | ICD-10-CM

## 2019-09-12 DIAGNOSIS — I10 Essential (primary) hypertension: Secondary | ICD-10-CM

## 2019-09-12 DIAGNOSIS — G4733 Obstructive sleep apnea (adult) (pediatric): Secondary | ICD-10-CM | POA: Diagnosis not present

## 2019-09-12 LAB — LIPID PANEL
Chol/HDL Ratio: 3.1 ratio (ref 0.0–4.4)
Cholesterol, Total: 131 mg/dL (ref 100–199)
HDL: 42 mg/dL (ref >39)
LDL Chol Calc (NIH): 62 mg/dL (ref 0–99)
Triglycerides: 155 mg/dL — ABNORMAL HIGH (ref 0–149)
VLDL Cholesterol Cal: 27 mg/dL (ref 5–40)

## 2019-09-12 LAB — CBC
Hematocrit: 46.8 % — ABNORMAL HIGH (ref 34.0–46.6)
Hemoglobin: 15.5 g/dL (ref 11.1–15.9)
MCH: 30.2 pg (ref 26.6–33.0)
MCHC: 33.1 g/dL (ref 31.5–35.7)
MCV: 91 fL (ref 79–97)
Platelets: 288 10*3/uL (ref 150–450)
RBC: 5.13 x10E6/uL (ref 3.77–5.28)
RDW: 12.8 % (ref 11.7–15.4)
WBC: 11.5 10*3/uL — ABNORMAL HIGH (ref 3.4–10.8)

## 2019-09-12 LAB — BASIC METABOLIC PANEL
BUN/Creatinine Ratio: 16 (ref 12–28)
BUN: 12 mg/dL (ref 8–27)
CO2: 23 mmol/L (ref 20–29)
Calcium: 9.5 mg/dL (ref 8.7–10.3)
Chloride: 102 mmol/L (ref 96–106)
Creatinine, Ser: 0.75 mg/dL (ref 0.57–1.00)
GFR calc Af Amer: 97 mL/min/{1.73_m2} (ref >59)
GFR calc non Af Amer: 85 mL/min/{1.73_m2} (ref >59)
Glucose: 109 mg/dL — ABNORMAL HIGH (ref 65–99)
Potassium: 3.8 mmol/L (ref 3.5–5.2)
Sodium: 140 mmol/L (ref 134–144)

## 2019-09-12 LAB — HEPATIC FUNCTION PANEL
ALT: 27 IU/L (ref 0–32)
AST: 29 IU/L (ref 0–40)
Albumin: 4.3 g/dL (ref 3.8–4.8)
Alkaline Phosphatase: 126 IU/L — ABNORMAL HIGH (ref 48–121)
Bilirubin Total: 0.6 mg/dL (ref 0.0–1.2)
Bilirubin, Direct: 0.23 mg/dL (ref 0.00–0.40)
Total Protein: 7 g/dL (ref 6.0–8.5)

## 2019-09-12 NOTE — Patient Instructions (Addendum)
After Visit Summary:  We will be checking the following labs today - BMET, CBC, HPF and Lipids   Medication Instructions:    Continue with your current medicines.    If you need a refill on your cardiac medications before your next appointment, please call your pharmacy.     Testing/Procedures To Be Arranged:  N/A  Follow-Up:   See me in about 6 months    At [facility], you and your health needs are our priority.  As part of our continuing mission to provide you with exceptional heart care, we have created designated Provider Care Teams.  These Care Teams include your primary Cardiologist (physician) and Advanced Practice Providers (APPs -  Physician Assistants and Nurse Practitioners) who all work together to provide you with the care you need, when you need it.  Special Instructions:  . Stay safe, stay home, wash your hands for at least 20 seconds and wear a mask when out in public.  . It was good to talk with you today.  Marland Kitchen Keep a check on your BP for me.  Marland Kitchen Keep working on smoking cessation   Call the [facility] office at [phone] if you have any questions, problems or concerns.

## 2020-02-29 ENCOUNTER — Inpatient Hospital Stay (HOSPITAL_COMMUNITY)
Admission: EM | Admit: 2020-02-29 | Discharge: 2020-03-05 | DRG: 871 | Disposition: A | Discharge status: Home / Self Care | Payer: Medicare HMO | Source: Ambulatory Visit | Attending: Family Medicine | Admitting: Family Medicine

## 2020-02-29 ENCOUNTER — Emergency Department (HOSPITAL_COMMUNITY): Payer: Medicare HMO

## 2020-02-29 ENCOUNTER — Other Ambulatory Visit: Payer: Self-pay

## 2020-02-29 DIAGNOSIS — E785 Hyperlipidemia, unspecified: Secondary | ICD-10-CM | POA: Diagnosis present

## 2020-02-29 DIAGNOSIS — Z7982 Long term (current) use of aspirin: Secondary | ICD-10-CM

## 2020-02-29 DIAGNOSIS — R69 Illness, unspecified: Secondary | ICD-10-CM | POA: Diagnosis not present

## 2020-02-29 DIAGNOSIS — Z20822 Contact with and (suspected) exposure to covid-19: Secondary | ICD-10-CM | POA: Diagnosis not present

## 2020-02-29 DIAGNOSIS — R918 Other nonspecific abnormal finding of lung field: Secondary | ICD-10-CM | POA: Diagnosis not present

## 2020-02-29 DIAGNOSIS — F1910 Other psychoactive substance abuse, uncomplicated: Secondary | ICD-10-CM | POA: Diagnosis not present

## 2020-02-29 DIAGNOSIS — Z7901 Long term (current) use of anticoagulants: Secondary | ICD-10-CM

## 2020-02-29 DIAGNOSIS — I2510 Atherosclerotic heart disease of native coronary artery without angina pectoris: Secondary | ICD-10-CM | POA: Diagnosis present

## 2020-02-29 DIAGNOSIS — A419 Sepsis, unspecified organism: Principal | ICD-10-CM | POA: Diagnosis present

## 2020-02-29 DIAGNOSIS — F1290 Cannabis use, unspecified, uncomplicated: Secondary | ICD-10-CM | POA: Diagnosis present

## 2020-02-29 DIAGNOSIS — R7401 Elevation of levels of liver transaminase levels: Secondary | ICD-10-CM | POA: Diagnosis present

## 2020-02-29 DIAGNOSIS — Z79899 Other long term (current) drug therapy: Secondary | ICD-10-CM

## 2020-02-29 DIAGNOSIS — I4891 Unspecified atrial fibrillation: Secondary | ICD-10-CM

## 2020-02-29 DIAGNOSIS — Z8249 Family history of ischemic heart disease and other diseases of the circulatory system: Secondary | ICD-10-CM

## 2020-02-29 DIAGNOSIS — J189 Pneumonia, unspecified organism: Secondary | ICD-10-CM | POA: Diagnosis present

## 2020-02-29 DIAGNOSIS — Z955 Presence of coronary angioplasty implant and graft: Secondary | ICD-10-CM

## 2020-02-29 DIAGNOSIS — T464X5A Adverse effect of angiotensin-converting-enzyme inhibitors, initial encounter: Secondary | ICD-10-CM | POA: Diagnosis present

## 2020-02-29 DIAGNOSIS — J181 Lobar pneumonia, unspecified organism: Secondary | ICD-10-CM | POA: Diagnosis not present

## 2020-02-29 DIAGNOSIS — I10 Essential (primary) hypertension: Secondary | ICD-10-CM | POA: Diagnosis not present

## 2020-02-29 DIAGNOSIS — Z720 Tobacco use: Secondary | ICD-10-CM | POA: Diagnosis not present

## 2020-02-29 DIAGNOSIS — F32A Depression, unspecified: Secondary | ICD-10-CM | POA: Diagnosis present

## 2020-02-29 DIAGNOSIS — R6520 Severe sepsis without septic shock: Secondary | ICD-10-CM | POA: Diagnosis not present

## 2020-02-29 DIAGNOSIS — R911 Solitary pulmonary nodule: Secondary | ICD-10-CM | POA: Diagnosis not present

## 2020-02-29 DIAGNOSIS — E876 Hypokalemia: Secondary | ICD-10-CM

## 2020-02-29 DIAGNOSIS — F17210 Nicotine dependence, cigarettes, uncomplicated: Secondary | ICD-10-CM | POA: Diagnosis present

## 2020-02-29 DIAGNOSIS — Z8261 Family history of arthritis: Secondary | ICD-10-CM

## 2020-02-29 DIAGNOSIS — I480 Paroxysmal atrial fibrillation: Secondary | ICD-10-CM | POA: Diagnosis not present

## 2020-02-29 DIAGNOSIS — I959 Hypotension, unspecified: Secondary | ICD-10-CM | POA: Diagnosis present

## 2020-02-29 DIAGNOSIS — N179 Acute kidney failure, unspecified: Secondary | ICD-10-CM | POA: Diagnosis present

## 2020-02-29 LAB — CBC WITH DIFFERENTIAL/PLATELET
Abs Immature Granulocytes: 0.42 10*3/uL — ABNORMAL HIGH (ref 0.00–0.07)
Basophils Absolute: 0.1 10*3/uL (ref 0.0–0.1)
Basophils Relative: 1 %
Eosinophils Absolute: 0 10*3/uL (ref 0.0–0.5)
Eosinophils Relative: 0 %
HCT: 50.3 % — ABNORMAL HIGH (ref 36.0–46.0)
Hemoglobin: 17.3 g/dL — ABNORMAL HIGH (ref 12.0–15.0)
Immature Granulocytes: 3 %
Lymphocytes Relative: 4 %
Lymphs Abs: 0.6 10*3/uL — ABNORMAL LOW (ref 0.7–4.0)
MCH: 30.8 pg (ref 26.0–34.0)
MCHC: 34.4 g/dL (ref 30.0–36.0)
MCV: 89.5 fL (ref 80.0–100.0)
Monocytes Absolute: 0.2 10*3/uL (ref 0.1–1.0)
Monocytes Relative: 2 %
Neutro Abs: 11.8 10*3/uL — ABNORMAL HIGH (ref 1.7–7.7)
Neutrophils Relative %: 90 %
Platelets: 240 10*3/uL (ref 150–400)
RBC: 5.62 MIL/uL — ABNORMAL HIGH (ref 3.87–5.11)
RDW: 13.2 % (ref 11.5–15.5)
WBC: 13.1 10*3/uL — ABNORMAL HIGH (ref 4.0–10.5)
nRBC: 0 % (ref 0.0–0.2)

## 2020-02-29 LAB — URINALYSIS, ROUTINE W REFLEX MICROSCOPIC
Bilirubin Urine: NEGATIVE
Glucose, UA: NEGATIVE mg/dL
Ketones, ur: NEGATIVE mg/dL
Nitrite: POSITIVE — AB
Protein, ur: 30 mg/dL — AB
Specific Gravity, Urine: 1.013 (ref 1.005–1.030)
pH: 5 (ref 5.0–8.0)

## 2020-02-29 LAB — CBC
HCT: 44 % (ref 36.0–46.0)
Hemoglobin: 15.2 g/dL — ABNORMAL HIGH (ref 12.0–15.0)
MCH: 31 pg (ref 26.0–34.0)
MCHC: 34.5 g/dL (ref 30.0–36.0)
MCV: 89.8 fL (ref 80.0–100.0)
Platelets: 219 10*3/uL (ref 150–400)
RBC: 4.9 MIL/uL (ref 3.87–5.11)
RDW: 13.2 % (ref 11.5–15.5)
WBC: 9.9 10*3/uL (ref 4.0–10.5)
nRBC: 0 % (ref 0.0–0.2)

## 2020-02-29 LAB — HIV ANTIBODY (ROUTINE TESTING W REFLEX): HIV Screen 4th Generation wRfx: NONREACTIVE

## 2020-02-29 LAB — COMPREHENSIVE METABOLIC PANEL
ALT: 30 U/L (ref 0–44)
AST: 47 U/L — ABNORMAL HIGH (ref 15–41)
Albumin: 2.4 g/dL — ABNORMAL LOW (ref 3.5–5.0)
Alkaline Phosphatase: 134 U/L — ABNORMAL HIGH (ref 38–126)
Anion gap: 19 — ABNORMAL HIGH (ref 5–15)
BUN: 50 mg/dL — ABNORMAL HIGH (ref 8–23)
CO2: 21 mmol/L — ABNORMAL LOW (ref 22–32)
Calcium: 9.3 mg/dL (ref 8.9–10.3)
Chloride: 94 mmol/L — ABNORMAL LOW (ref 98–111)
Creatinine, Ser: 2.63 mg/dL — ABNORMAL HIGH (ref 0.44–1.00)
GFR, Estimated: 20 mL/min — ABNORMAL LOW (ref >60)
Glucose, Bld: 140 mg/dL — ABNORMAL HIGH (ref 70–99)
Potassium: 2.7 mmol/L — CL (ref 3.5–5.1)
Sodium: 134 mmol/L — ABNORMAL LOW (ref 135–145)
Total Bilirubin: 2.9 mg/dL — ABNORMAL HIGH (ref 0.3–1.2)
Total Protein: 7.2 g/dL (ref 6.5–8.1)

## 2020-02-29 LAB — RAPID URINE DRUG SCREEN, HOSP PERFORMED
Amphetamines: NOT DETECTED
Barbiturates: NOT DETECTED
Benzodiazepines: NOT DETECTED
Cocaine: NOT DETECTED
Opiates: NOT DETECTED
Tetrahydrocannabinol: POSITIVE — AB

## 2020-02-29 LAB — RESP PANEL BY RT-PCR (FLU A&B, COVID) ARPGX2
Influenza A by PCR: NEGATIVE
Influenza B by PCR: NEGATIVE
SARS Coronavirus 2 by RT PCR: NEGATIVE

## 2020-02-29 LAB — PROTIME-INR
INR: 1.7 — ABNORMAL HIGH (ref 0.8–1.2)
Prothrombin Time: 19.8 seconds — ABNORMAL HIGH (ref 11.4–15.2)

## 2020-02-29 LAB — PROCALCITONIN: Procalcitonin: 2.06 ng/mL

## 2020-02-29 LAB — CREATININE, SERUM
Creatinine, Ser: 2.2 mg/dL — ABNORMAL HIGH (ref 0.44–1.00)
GFR, Estimated: 24 mL/min — ABNORMAL LOW (ref >60)

## 2020-02-29 LAB — LACTIC ACID, PLASMA
Lactic Acid, Venous: 3.5 mmol/L (ref 0.5–1.9)
Lactic Acid, Venous: 2.3 mmol/L (ref 0.5–1.9)
Lactic Acid, Venous: 2.2 mmol/L (ref 0.5–1.9)

## 2020-02-29 LAB — APTT: aPTT: 32 seconds (ref 24–36)

## 2020-02-29 LAB — MAGNESIUM: Magnesium: 1.7 mg/dL (ref 1.7–2.4)

## 2020-02-29 LAB — POTASSIUM: Potassium: 2.9 mmol/L — ABNORMAL LOW (ref 3.5–5.1)

## 2020-02-29 MED ORDER — AMIODARONE LOAD VIA INFUSION
150.0000 mg | Freq: Once | INTRAVENOUS | Status: AC
  Administered 2020-02-29: 150 mg via INTRAVENOUS
  Filled 2020-02-29: qty 83.34

## 2020-02-29 MED ORDER — POTASSIUM CHLORIDE 10 MEQ/100ML IV SOLN
10.0000 meq | Freq: Once | INTRAVENOUS | Status: AC
  Administered 2020-02-29: 10 meq via INTRAVENOUS

## 2020-02-29 MED ORDER — ONDANSETRON HCL 4 MG PO TABS: 4.0000 mg | ORAL_TABLET | Freq: Four times a day (QID) | ORAL | Status: DC | PRN

## 2020-02-29 MED ORDER — LACTATED RINGERS IV BOLUS (SEPSIS)
1000.0000 mL | Freq: Once | INTRAVENOUS | Status: AC
  Administered 2020-02-29: 1000 mL via INTRAVENOUS

## 2020-02-29 MED ORDER — LACTATED RINGERS IV SOLN: INTRAVENOUS | Status: DC

## 2020-02-29 MED ORDER — TRAZODONE HCL 50 MG PO TABS: 50.0000 mg | ORAL_TABLET | Freq: Every evening | ORAL | Status: DC | PRN

## 2020-02-29 MED ORDER — SODIUM CHLORIDE 0.9 % IV SOLN: 2.0000 g | INTRAVENOUS | Status: DC

## 2020-02-29 MED ORDER — POTASSIUM CHLORIDE 10 MEQ/100ML IV SOLN
10.0000 meq | Freq: Once | INTRAVENOUS | Status: AC
  Administered 2020-03-01: 10 meq via INTRAVENOUS
  Filled 2020-02-29: qty 100

## 2020-02-29 MED ORDER — METHYLPREDNISOLONE SODIUM SUCC 125 MG IJ SOLR
60.0000 mg | INTRAMUSCULAR | Status: DC
  Administered 2020-02-29 – 2020-03-04 (×5): 60 mg via INTRAVENOUS
  Filled 2020-02-29 (×5): qty 2

## 2020-02-29 MED ORDER — SODIUM CHLORIDE 0.9 % IV SOLN
500.0000 mg | INTRAVENOUS | Status: DC
  Administered 2020-02-29 – 2020-03-02 (×3): 500 mg via INTRAVENOUS
  Filled 2020-02-29 (×3): qty 500

## 2020-02-29 MED ORDER — ACETAMINOPHEN 650 MG RE SUPP: 650.0000 mg | Freq: Four times a day (QID) | RECTAL | Status: DC | PRN

## 2020-02-29 MED ORDER — ADULT MULTIVITAMIN W/MINERALS CH
1.0000 | ORAL_TABLET | Freq: Every day | ORAL | Status: DC
  Administered 2020-02-29 – 2020-03-05 (×6): 1 via ORAL
  Filled 2020-02-29 (×6): qty 1

## 2020-02-29 MED ORDER — ZINC SULFATE 220 (50 ZN) MG PO CAPS
220.0000 mg | ORAL_CAPSULE | Freq: Every day | ORAL | Status: DC
  Administered 2020-02-29 – 2020-03-05 (×6): 220 mg via ORAL
  Filled 2020-02-29 (×6): qty 1

## 2020-02-29 MED ORDER — APIXABAN 5 MG PO TABS
5.0000 mg | ORAL_TABLET | Freq: Two times a day (BID) | ORAL | Status: DC
  Administered 2020-02-29 – 2020-03-05 (×10): 5 mg via ORAL
  Filled 2020-02-29 (×10): qty 1

## 2020-02-29 MED ORDER — ONDANSETRON HCL 4 MG/2ML IJ SOLN: 4.0000 mg | Freq: Four times a day (QID) | INTRAMUSCULAR | Status: DC | PRN

## 2020-02-29 MED ORDER — POTASSIUM CHLORIDE CRYS ER 20 MEQ PO TBCR
40.0000 meq | EXTENDED_RELEASE_TABLET | Freq: Once | ORAL | Status: AC
  Administered 2020-02-29: 40 meq via ORAL
  Filled 2020-02-29: qty 2

## 2020-02-29 MED ORDER — AMIODARONE HCL IN DEXTROSE 360-4.14 MG/200ML-% IV SOLN
60.0000 mg/h | INTRAVENOUS | Status: AC
  Administered 2020-02-29: 60 mg/h via INTRAVENOUS
  Filled 2020-02-29: qty 200

## 2020-02-29 MED ORDER — LACTATED RINGERS IV BOLUS (SEPSIS)
500.0000 mL | Freq: Once | INTRAVENOUS | Status: AC
  Administered 2020-02-29: 500 mL via INTRAVENOUS

## 2020-02-29 MED ORDER — ONDANSETRON HCL 4 MG/2ML IJ SOLN
4.0000 mg | Freq: Once | INTRAMUSCULAR | Status: AC
  Administered 2020-02-29: 4 mg via INTRAVENOUS
  Filled 2020-02-29: qty 2

## 2020-02-29 MED ORDER — ACETAMINOPHEN 325 MG PO TABS: 650.0000 mg | ORAL_TABLET | Freq: Four times a day (QID) | ORAL | Status: DC | PRN

## 2020-02-29 MED ORDER — ASPIRIN EC 81 MG PO TBEC
81.0000 mg | DELAYED_RELEASE_TABLET | Freq: Every day | ORAL | Status: DC
  Administered 2020-02-29 – 2020-03-05 (×6): 81 mg via ORAL
  Filled 2020-02-29 (×6): qty 1

## 2020-02-29 MED ORDER — BISACODYL 5 MG PO TBEC: 5.0000 mg | DELAYED_RELEASE_TABLET | Freq: Every day | ORAL | Status: DC | PRN

## 2020-02-29 MED ORDER — ENOXAPARIN SODIUM 30 MG/0.3ML ~~LOC~~ SOLN
30.0000 mg | SUBCUTANEOUS | Status: DC
  Administered 2020-02-29: 30 mg via SUBCUTANEOUS
  Filled 2020-02-29: qty 0.3

## 2020-02-29 MED ORDER — AMIODARONE HCL IN DEXTROSE 360-4.14 MG/200ML-% IV SOLN
30.0000 mg/h | INTRAVENOUS | Status: DC
  Administered 2020-03-01 – 2020-03-03 (×3): 30 mg/h via INTRAVENOUS
  Filled 2020-02-29 (×5): qty 200

## 2020-02-29 MED ORDER — LEVALBUTEROL HCL 1.25 MG/0.5ML IN NEBU
1.2500 mg | INHALATION_SOLUTION | Freq: Four times a day (QID) | RESPIRATORY_TRACT | Status: DC
  Administered 2020-02-29: 1.25 mg via RESPIRATORY_TRACT
  Filled 2020-02-29: qty 0.5

## 2020-02-29 MED ORDER — SODIUM CHLORIDE 0.9 % IV SOLN: 500.0000 mg | INTRAVENOUS | Status: DC

## 2020-02-29 MED ORDER — SODIUM CHLORIDE 0.9 % IV SOLN
2.0000 g | INTRAVENOUS | Status: DC
  Administered 2020-02-29 – 2020-03-02 (×3): 2 g via INTRAVENOUS
  Filled 2020-02-29 (×3): qty 20

## 2020-02-29 MED ORDER — ASCORBIC ACID 500 MG PO TABS
500.0000 mg | ORAL_TABLET | Freq: Every day | ORAL | Status: DC
  Administered 2020-02-29 – 2020-03-05 (×6): 500 mg via ORAL
  Filled 2020-02-29 (×6): qty 1

## 2020-02-29 MED ORDER — SENNOSIDES-DOCUSATE SODIUM 8.6-50 MG PO TABS
1.0000 | ORAL_TABLET | Freq: Every evening | ORAL | Status: DC | PRN
  Administered 2020-03-02: 1 via ORAL
  Filled 2020-02-29: qty 1

## 2020-02-29 MED ORDER — OXYCODONE HCL 5 MG PO TABS: 5.0000 mg | ORAL_TABLET | ORAL | Status: DC | PRN

## 2020-02-29 NOTE — Sepsis Progress Note (Signed)
Sepsis monitoring complete 

## 2020-02-29 NOTE — ED Provider Notes (Addendum)
[facility] Provider Note   CSN: [id number] Arrival date & time: 02/29/20  1401     History No chief complaint on file.   Kari Harrison is a 64 y.o. female.  HPI [age] year old female with a history of CAD w/ stent placement, hyperlipidemia, hypertension, paroxysmal A. fib on Eliquis, angina presents to the ER from her PCPs office with concerns for pneumonia.  Patient states that she started to feel poorly on Friday, felt weak and basically slept for most of the day over the weekend.  She states she called her PCP on Monday and they did a Covid test which was negative.  She states that she has been spiking fevers as high as 103.  Has felt chilled and weak with poor p.o. intake.  She states that she only developed a cough on Monday.  Has been mildly productive with no blood.  Was seen at her PCPs office today and had a chest x-ray done, was sent to the ER with concerns of pneumonia and hypotension.  She denies any chest pain, headache, diaphoresis.  She does endorse some dysuria.    Past Medical History:  Diagnosis Date  . CAD (coronary artery disease)    a. Cardiac CTA: severe 3VD;  b. 05/2014 Cath/PCI: LM nl, LAD 50-60p/m, 4736m, D1 50ost/m, RI small/plaque, LCX dominant 20-30p, 509m, 60/90d (2.25x24 Promus DES), OM2 40-719m, RCA nondom, mod diff plaque, EF 55%.  Marland Kitchen. Hyperlipidemia   . Hypertension   . Pulmonary nodules    a. 05/2014 CTA: left lung pulm nodules w/ rec for 3 month f/u noncontrast CT.  . Tobacco abuse     Patient Active Problem List   Diagnosis Date Noted  . Severe sepsis (HCC) 02/29/2020  . Snoring 05/03/2019  . Paroxysmal atrial fibrillation (HCC) 03/08/2019  . Secondary hypercoagulable state (HCC) 03/08/2019  . Atrial fibrillation with RVR (HCC) 02/20/2019  . Marijuana abuse 02/20/2019  . Hyperlipidemia   . Hypertension   . Pulmonary nodules   . Unstable angina (HCC)   . Tobacco abuse 06/15/2014  . CAD in native artery  06/15/2014  . Angina pectoris (HCC) 06/15/2014  . Chest pain 06/14/2014    Past Surgical History:  Procedure Laterality Date  . CESAREAN SECTION    . LEFT HEART CATHETERIZATION WITH CORONARY ANGIOGRAM N/A 06/16/2014   Procedure: LEFT HEART CATHETERIZATION WITH CORONARY ANGIOGRAM;  Surgeon: Dolores Pattyaniel R Bensimhon, MD;  [facility];  Service: Cardiovascular;  Laterality: N/A;     OB History   No obstetric history on file.     Family History  Problem Relation Age of Onset  . Arthritis Mother   . Macular degeneration Mother   . Heart attack Father   . Hypertension Sister   . Atrial fibrillation Neg Hx     Social History   Tobacco Use  . Smoking status: Current Every Day Smoker    Packs/day: 0.02    Years: 24.00    Pack years: 0.48    Types: Cigarettes  . Smokeless tobacco: Never Used  . Tobacco comment: denies need for a patch/ less than 6 a day  Vaping Use  . Vaping Use: Never used  Substance Use Topics  . Alcohol use: No    Alcohol/week: 0.0 standard drinks  . Drug use: Not Currently    Types: Marijuana    Comment: occasional use    Home Medications Prior to Admission medications   Medication Sig Start Date End Date Taking? Authorizing Provider  apixaban (ELIQUIS) 5 MG TABS tablet Take 1 tablet (5 mg total) by mouth 2 (two) times daily. 02/22/19 07/08/19  Jerald Kief, MD  Ascorbic Acid (VITAMIN C) 100 MG tablet Take 100 mg by mouth daily.    [provider]  aspirin 81 MG tablet Take 1 tablet (81 mg total) by mouth daily. 06/17/14   Creig Hines, NP  atenolol (TENORMIN) 50 MG tablet Take 50 mg by mouth daily. 06/12/14   [provider]  atorvastatin (LIPITOR) 80 MG tablet take 1 tablet by mouth once daily ; MAKE DOCTOR'S APPOINTMENT ! 01/28/16   Laurey Morale, MD  CALCIUM PO Take 1 tablet by mouth daily.    [provider]  cholecalciferol (VITAMIN D3) 25 MCG (1000 UT) tablet Take 1,000 Units by mouth daily.     [provider]  lisinopril-hydrochlorothiazide (PRINZIDE,ZESTORETIC) 20-12.5 MG per tablet Take 2 tablets by mouth daily. 06/12/14   [provider]  loratadine (CLARITIN) 10 MG tablet Take 10 mg by mouth daily.    [provider]  Multiple Vitamins-Minerals (ZINC PO) Take 1 tablet by mouth daily.    [provider]  omeprazole (PRILOSEC) 20 MG capsule Take 20 mg by mouth daily.    [provider]  venlafaxine XR (EFFEXOR-XR) 75 MG 24 hr capsule Take 75 mg by mouth daily. 06/12/14   [provider]    Allergies    Patient has no known allergies.  Review of Systems   Review of Systems  Constitutional: Positive for chills, fatigue and fever.  HENT: Negative for ear pain and sore throat.   Eyes: Negative for pain and visual disturbance.  Respiratory: Positive for cough. Negative for shortness of breath.   Cardiovascular: Negative for chest pain and palpitations.  Gastrointestinal: Negative for abdominal pain and vomiting.  Genitourinary: Negative for dysuria and hematuria.  Musculoskeletal: Negative for arthralgias and back pain.  Skin: Negative for color change and rash.  Neurological: Positive for weakness. Negative for seizures and syncope.  All other systems reviewed and are negative.   Physical Exam Updated Vital Signs BP 92/60 (BP Location: Right Arm)   Pulse (!) 105   Temp 97.6 F (36.4 C) (Oral)   Resp (!) 23   Ht 5\' 2"  (1.575 m)   Wt 76 kg   SpO2 93%   BMI 30.65 kg/m   Physical Exam Vitals and nursing note reviewed.  Constitutional:      General: She is not in acute distress.    Appearance: She is well-developed. She is ill-appearing. She is not diaphoretic.  HENT:     Head: Normocephalic and atraumatic.     Nose: Nose normal.     Mouth/Throat:     Mouth: Mucous membranes are moist.     Pharynx: Oropharynx is clear.  Eyes:     Conjunctiva/sclera: Conjunctivae normal.  Cardiovascular:     Rate and Rhythm:  Tachycardia present. Rhythm irregular.     Pulses: Normal pulses.     Heart sounds: Normal heart sounds. No murmur heard.   Pulmonary:     Effort: Pulmonary effort is normal. No respiratory distress.     Breath sounds: Normal breath sounds. No wheezing, rhonchi or rales.     Comments: Diminished throughout Chest:     Chest wall: No tenderness.  Abdominal:     General: Abdomen is flat.     Palpations: Abdomen is soft.     Tenderness: There is no abdominal tenderness.  Musculoskeletal:  General: Normal range of motion.     Cervical back: Neck supple.     Right lower leg: No edema.     Left lower leg: No edema.  Skin:    General: Skin is warm and dry.     Findings: No erythema.  Neurological:     General: No focal deficit present.     Mental Status: She is alert and oriented to person, place, and time.  Psychiatric:        Mood and Affect: Mood normal.        Behavior: Behavior normal.     ED Results / Procedures / Treatments   Labs (all labs ordered are listed, but only abnormal results are displayed) Labs Reviewed  LACTIC ACID, PLASMA - Abnormal; Notable for the following components:      Result Value   Lactic Acid, Venous 3.5 (*)    All other components within normal limits  COMPREHENSIVE METABOLIC PANEL - Abnormal; Notable for the following components:   Sodium 134 (*)    Potassium 2.7 (*)    Chloride 94 (*)    CO2 21 (*)    Glucose, Bld 140 (*)    BUN 50 (*)    Creatinine, Ser 2.63 (*)    Albumin 2.4 (*)    AST 47 (*)    Alkaline Phosphatase 134 (*)    Total Bilirubin 2.9 (*)    GFR, Estimated 20 (*)    Anion gap 19 (*)    All other components within normal limits  CBC WITH DIFFERENTIAL/PLATELET - Abnormal; Notable for the following components:   WBC 13.1 (*)    RBC 5.62 (*)    Hemoglobin 17.3 (*)    HCT 50.3 (*)    Neutro Abs 11.8 (*)    Lymphs Abs 0.6 (*)    Abs Immature Granulocytes 0.42 (*)    All other components within normal limits   PROTIME-INR - Abnormal; Notable for the following components:   Prothrombin Time 19.8 (*)    INR 1.7 (*)    All other components within normal limits  RESP PANEL BY RT-PCR (FLU A&B, COVID) ARPGX2  CULTURE, BLOOD (ROUTINE X 2)  CULTURE, BLOOD (ROUTINE X 2)  URINE CULTURE  EXPECTORATED SPUTUM ASSESSMENT W REFEX TO RESP CULTURE  APTT  LACTIC ACID, PLASMA  URINALYSIS, ROUTINE W REFLEX MICROSCOPIC  HIV ANTIBODY (ROUTINE TESTING W REFLEX)  PROTIME-INR  CORTISOL-AM, BLOOD  CBC  CREATININE, SERUM  COMPREHENSIVE METABOLIC PANEL  MAGNESIUM  PHOSPHORUS  CBC WITH DIFFERENTIAL/PLATELET  LACTIC ACID, PLASMA  LACTIC ACID, PLASMA  PROCALCITONIN  PROCALCITONIN    EKG None  Radiology DG Chest Port 1 View  Result Date: 02/29/2020 CLINICAL DATA:  Sepsis, cough, fever, previous abnormal chest x-ray EXAM: PORTABLE CHEST 1 VIEW COMPARISON:  02/29/2020 at 1:14 p.m. FINDINGS: Single frontal view of the chest was performed. The image is mislabeled by the technologist, as numerous prior exams demonstrate the cardiac apex points toward the left. The cardiac silhouette is unremarkable. There is dense left lower lobe consolidation consistent with pneumonia. Small left effusion cannot be excluded. No pneumothorax. Right chest is clear. IMPRESSION: 1. Dense left lower lobe consolidation most compatible with pneumonia. Followup PA and lateral chest X-ray is recommended in 3-4 weeks following trial of antibiotic therapy to ensure resolution and exclude underlying malignancy. Electronically Signed   By: Sharlet Salina M.D.   On: 02/29/2020 15:36    Procedures .Critical Care Performed by: Mare Ferrari, PA-C Authorized by: Vernia Buff,  Antony Salmon, PA-C   Critical care provider statement:    Critical care time (minutes):  45   Critical care was necessary to treat or prevent imminent or life-threatening deterioration of the following conditions:  Metabolic crisis, sepsis and dehydration   Critical care was time  spent personally by me on the following activities:  Discussions with consultants, evaluation of patient's response to treatment, examination of patient, ordering and performing treatments and interventions, ordering and review of laboratory studies, ordering and review of radiographic studies, pulse oximetry, re-evaluation of patient's condition, obtaining history from patient or surrogate and review of old charts   (including critical care time)  Medications Ordered in ED Medications  lactated ringers infusion (has no administration in time range)  lactated ringers bolus 1,000 mL (1,000 mLs Intravenous New Bag/Given 02/29/20 1529)    And  lactated ringers bolus 1,000 mL (1,000 mLs Intravenous New Bag/Given 02/29/20 1529)    And  lactated ringers bolus 500 mL (has no administration in time range)  cefTRIAXone (ROCEPHIN) 2 g in sodium chloride 0.9 % 100 mL IVPB (0 g Intravenous Stopped 02/29/20 1618)  azithromycin (ZITHROMAX) 500 mg in sodium chloride 0.9 % 250 mL IVPB (500 mg Intravenous New Bag/Given 02/29/20 1619)  potassium chloride SA (KLOR-CON) CR tablet 40 mEq (has no administration in time range)  potassium chloride 10 mEq in 100 mL IVPB (has no administration in time range)  potassium chloride 10 mEq in 100 mL IVPB (has no administration in time range)  enoxaparin (LOVENOX) injection 30 mg (has no administration in time range)  lactated ringers infusion (has no administration in time range)  acetaminophen (TYLENOL) tablet 650 mg (has no administration in time range)    Or  acetaminophen (TYLENOL) suppository 650 mg (has no administration in time range)  oxyCODONE (Oxy IR/ROXICODONE) immediate release tablet 5 mg (has no administration in time range)  traZODone (DESYREL) tablet 50 mg (has no administration in time range)  senna-docusate (Senokot-S) tablet 1 tablet (has no administration in time range)  bisacodyl (DULCOLAX) EC tablet 5 mg (has no administration in time range)   ondansetron (ZOFRAN) tablet 4 mg (has no administration in time range)    Or  ondansetron (ZOFRAN) injection 4 mg (has no administration in time range)  aspirin EC tablet 81 mg (has no administration in time range)  multivitamin with minerals tablet 1 tablet (has no administration in time range)  zinc sulfate capsule 220 mg (has no administration in time range)  ascorbic acid (VITAMIN C) tablet 500 mg (has no administration in time range)  ondansetron (ZOFRAN) injection 4 mg (4 mg Intravenous Given 02/29/20 1644)    ED Course  I have reviewed the triage vital signs and the nursing notes.  Pertinent labs & imaging results that were available during my care of the patient were reviewed by me and considered in my medical decision making (see chart for details).    MDM Rules/Calculators/A&P                          64 year old female with weakness, hypotensive, sent from PCP with concerns of pneumonia On presentation, she is alert, oriented, ill-appearing, however not tachypneic or hypoxic.  Lung sounds slightly diminished.  Patient is tachycardic and heart rate is regular on my exam.  On arrival, patient's blood pressure was 71/34, afebrile, heart rate of 45, tachypneic.  Sats at 94%.  With this presentation, I initiated a code sepsis.   On my  exam, patient is in A. fib with RVR.  Rate between 120 and 150.  Patient was also seen and evaluated by my supervising physician Dr. Tami Ribas.  We will hold any medications, will give fluids and reassess.  Labs reviewed and interpreted by me -CBC with a white count of 13.1, normal 17.3 -CMP with mild hyponatremia, critical potassium of 2.7.  She also has evidence of an AKI with a creatinine of 2.63 and a BUN of 50.  AST of 47.  Elevated total bilirubin and elevated anion gap at 19. -Critical lactic acid of 3.5 -INR 1.7 -COVID is negative   Imaging reviewed and interpreted by myself -Chest x-ray with dense left lower lobe consolidation most  compatible with pneumonia.  MDM: Patient had a fluid resuscitation protocol for sepsis order set, started on Zithromax and Rocephin.  Patient's heart rate improved to about 110 with some fluids on my reevaluation.  IV and oral potassium ordered.  Consulted hospitalist team for admission for further management and evaluation.  Pt was seen and evaluated by Dr. Judd Lien who is agreeable to the above plan and disposition.   Final Clinical Impression(s) / ED Diagnoses Final diagnoses:  Hypokalemia  Sepsis due to pneumonia [facility])  Atrial fibrillation with RVR [facility])    Rx / DC Orders ED Discharge Orders    None       Mare Ferrari, PA-C 02/29/20 1729    Mare Ferrari, PA-C 02/29/20 1748    Geoffery Lyons, MD 03/01/20 [phone]

## 2020-02-29 NOTE — Sepsis Progress Note (Signed)
Notified bedside nurse of need to draw repeat lactic acid after completion of fluid resusitation. 

## 2020-02-29 NOTE — ED Triage Notes (Signed)
Pt here from MD office for PNA and admission , hypotensive on arrival with fever

## 2020-02-29 NOTE — ED Notes (Signed)
Attempted to call report, RN unavailable, will call back. 

## 2020-02-29 NOTE — H&P (Signed)
[facility] History and Physical  Kari Harrison ZOX:[id number] DOB: 1955-05-21 DOA: 02/29/2020  Referring physician:  PCP: Sigmund HazelMiller, Lisa, MD   Chief Complaint: Kari Harrison OB  HPI: Kari Harrison is a 64 y.o. WF PMHx CAD, HTN, A. fib on Eliquis, Pulmonary nodules, Tobacco abuse    Review of Systems:  Covid vaccination; vaccinated  Constitutional:  No weight loss, positive night sweats, Fevers, chills, fatigue.  HEENT:  No headaches, Difficulty swallowing,Tooth/dental problems,Sore throat,  No sneezing, itching, ear ache, nasal congestion, post nasal drip,  Cardio-vascular:  No chest pain, Orthopnea, PND, swelling in lower extremities, anasarca, dizziness, palpitations  GI:  No heartburn, indigestion, abdominal pain, nausea, vomiting, diarrhea, change in bowel habits, loss of appetite  Resp:   positive shortness of breath with exertion or at rest.  Positive excess mucus,  positive productive cough, No non-productive cough, No coughing up of blood.No change in color of mucus.No wheezing.No chest wall deformity  Skin:  no rash or lesions.  GU:  no dysuria, change in color of urine, no urgency or frequency. No flank pain.  Musculoskeletal:  No joint pain or swelling. No decreased range of motion. No back pain.  Psych:  No change in mood or affect. positive depression or anxiety. No memory loss.   Past Medical History:  Diagnosis Date  . CAD (coronary artery disease)    a. Cardiac CTA: severe 3VD;  b. 05/2014 Cath/PCI: LM nl, LAD 50-60p/m, 5532m, D1 50ost/m, RI small/plaque, LCX dominant 20-30p, 142m, 60/90d (2.25x24 Promus DES), OM2 40-5842m, RCA nondom, mod diff plaque, EF 55%.  Marland Kitchen. Hyperlipidemia   . Hypertension   . Pulmonary nodules    a. 05/2014 CTA: left lung pulm nodules w/ rec for 3 month f/u noncontrast CT.  . Tobacco abuse    Past Surgical History:  Procedure Laterality Date  . CESAREAN SECTION    . LEFT HEART CATHETERIZATION WITH CORONARY ANGIOGRAM N/A 06/16/2014     Procedure: LEFT HEART CATHETERIZATION WITH CORONARY ANGIOGRAM;  Surgeon: Dolores Pattyaniel R Bensimhon, MD;  [facility];  Service: Cardiovascular;  Laterality: N/A;   Social History:  reports that she has been smoking cigarettes. She has a 0.48 pack-year smoking history. She has never used smokeless tobacco. She reports previous drug use. Drug: Marijuana. She reports that she does not drink alcohol.  No Known Allergies  Family History  Problem Relation Age of Onset  . Arthritis Mother   . Macular degeneration Mother   . Heart attack Father   . Hypertension Sister   . Atrial fibrillation Neg Hx      Prior to Admission medications   Medication Sig Start Date End Date Taking? Authorizing Provider  apixaban (ELIQUIS) 5 MG TABS tablet Take 1 tablet (5 mg total) by mouth 2 (two) times daily. 02/22/19 07/08/19  Jerald Kiefhiu, Stephen K, MD  Ascorbic Acid (VITAMIN C) 100 MG tablet Take 100 mg by mouth daily.    [provider]  aspirin 81 MG tablet Take 1 tablet (81 mg total) by mouth daily. 06/17/14   Creig HinesBerge, Christopher Ronald, NP  atenolol (TENORMIN) 50 MG tablet Take 50 mg by mouth daily. 06/12/14   [provider]  atorvastatin (LIPITOR) 80 MG tablet take 1 tablet by mouth once daily ; MAKE DOCTOR'S APPOINTMENT ! 01/28/16   Laurey MoraleMcLean, Dalton S, MD  CALCIUM PO Take 1 tablet by mouth daily.    [provider]  cholecalciferol (VITAMIN D3) 25 MCG (1000 UT) tablet Take 1,000 Units by mouth daily.  [provider]  lisinopril-hydrochlorothiazide (PRINZIDE,ZESTORETIC) 20-12.5 MG per tablet Take 2 tablets by mouth daily. 06/12/14   [provider]  loratadine (CLARITIN) 10 MG tablet Take 10 mg by mouth daily.    [provider]  Multiple Vitamins-Minerals (ZINC PO) Take 1 tablet by mouth daily.    [provider]  omeprazole (PRILOSEC) 20 MG capsule Take 20 mg by mouth daily.    [provider]  venlafaxine XR (EFFEXOR-XR) 75 MG 24 hr  capsule Take 75 mg by mouth daily. 06/12/14   [provider]     Consultants:    Procedures/Significant Events:    I have personally reviewed and interpreted all radiology studies and my findings are as above.   VENTILATOR SETTINGS:    Cultures 12/1 blood pending 12/1 sputum pending   Antimicrobials: Anti-infectives (From admission, onward)   Start     Ordered Stop   02/29/20 1745  cefTRIAXone (ROCEPHIN) 2 g in sodium chloride 0.9 % 100 mL IVPB  Status:  Discontinued        02/29/20 1743 02/29/20 1745   02/29/20 1745  azithromycin (ZITHROMAX) 500 mg in sodium chloride 0.9 % 250 mL IVPB  Status:  Discontinued        02/29/20 1743 02/29/20 1745   02/29/20 1500  cefTRIAXone (ROCEPHIN) 2 g in sodium chloride 0.9 % 100 mL IVPB        02/29/20 1457     02/29/20 1500  azithromycin (ZITHROMAX) 500 mg in sodium chloride 0.9 % 250 mL IVPB        02/29/20 1457         Devices    LINES / TUBES:      Continuous Infusions: . azithromycin Stopped (02/29/20 1812)  . cefTRIAXone (ROCEPHIN)  IV Stopped (02/29/20 1618)  . lactated ringers 150 mL/hr at 02/29/20 1812  . lactated ringers    . potassium chloride      Physical Exam: Vitals:   02/29/20 1830 02/29/20 1845 02/29/20 1900 02/29/20 1915  BP: 105/72 110/71 (!) 123/92 124/76  Pulse: (!) 125 (!) 123 95 (!) 134  Resp: 18 (!) 23 (!) 21 19  Temp:      TempSrc:      SpO2: 92% 93% 93% 91%  Weight:      Height:        Wt Readings from Last 3 Encounters:  02/29/20 76 kg  09/12/19 76.4 kg  07/08/19 77.8 kg    General: A/O x4, positive acute respiratory distress Eyes: negative scleral hemorrhage, negative anisocoria, negative icterus ENT: Negative Runny nose, negative gingival bleeding, Neck:  Negative scars, masses, torticollis, lymphadenopathy, JVD Lungs: decreased breath sounds bilaterally positive expiratory wheezes, negative crackles Cardiovascular: Irregular irregular rhythm and rate without murmur  gallop or rub normal S1 and S2 Abdomen: OBESE, negative abdominal pain, nondistended, positive soft, bowel sounds, no rebound, no ascites, no appreciable mass Extremities: No significant cyanosis, clubbing, or edema bilateral lower extremities Skin: Negative rashes, lesions, ulcers Psychiatric:  Negative depression, positive anxiety, negative fatigue, negative mania  Central nervous system:  Cranial nerves II through XII intact, tongue/uvula midline, all extremities muscle strength 5/5, sensation intact throughout, negative dysarthria, negative expressive aphasia, negative receptive aphasia.        Labs on Admission:  Basic Metabolic Panel: Recent Labs  Lab 02/29/20 1531  NA 134*  K 2.7*  CL 94*  CO2 21*  GLUCOSE 140*  BUN 50*  CREATININE 2.63*  CALCIUM 9.3   Liver Function Tests:  Recent Labs  Lab 02/29/20 1531  AST 47*  ALT 30  ALKPHOS 134*  BILITOT 2.9*  PROT 7.2  ALBUMIN 2.4*   No results for input(s): LIPASE, AMYLASE in the last 168 hours. No results for input(s): AMMONIA in the last 168 hours. CBC: Recent Labs  Lab 02/29/20 1531 02/29/20 1815  WBC 13.1* 9.9  NEUTROABS 11.8*  --   HGB 17.3* 15.2*  HCT 50.3* 44.0  MCV 89.5 89.8  PLT 240 219   Cardiac Enzymes: No results for input(s): CKTOTAL, CKMB, CKMBINDEX, TROPONINI in the last 168 hours.  BNP (last 3 results) No results for input(s): BNP in the last 8760 hours.  ProBNP (last 3 results) No results for input(s): PROBNP in the last 8760 hours.  CBG: No results for input(s): GLUCAP in the last 168 hours.  Radiological Exams on Admission: DG Chest Port 1 View  Result Date: 02/29/2020 CLINICAL DATA:  Sepsis, cough, fever, previous abnormal chest x-ray EXAM: PORTABLE CHEST 1 VIEW COMPARISON:  02/29/2020 at 1:14 p.m. FINDINGS: Single frontal view of the chest was performed. The image is mislabeled by the technologist, as numerous prior exams demonstrate the cardiac apex points toward the left. The cardiac  silhouette is unremarkable. There is dense left lower lobe consolidation consistent with pneumonia. Small left effusion cannot be excluded. No pneumothorax. Right chest is clear. IMPRESSION: 1. Dense left lower lobe consolidation most compatible with pneumonia. Followup PA and lateral chest X-ray is recommended in 3-4 weeks following trial of antibiotic therapy to ensure resolution and exclude underlying malignancy. Electronically Signed   By: Sharlet Salina M.D.   On: 02/29/2020 15:36    EKG: Independently reviewed.  Assessment/Plan Active Problems:   Tobacco abuse   CAD in native artery   Hypertension   Pulmonary nodules   Paroxysmal atrial fibrillation (HCC)   Severe sepsis (HCC)   Pneumonia  Severe sepsis -On admission patient meets criteria for severe sepsis WBC> 12, HR> 90, RR> 20.  Lactic acid> 2.0 Site of infection lungs. -LR 14ml/hr -Continue current antibiotics -Trend procalcitonin/lactic acid; broaden antibiotics if required -Blood culture pending -Sputum culture pending -Titrate O2 to maintain SPO2> 92% -Xopenex QID -Solu-Medrol 60 mg daily -Flutter valve -Incentive spirometry -Urine rapid drug screen (Hx marijuana use)  Pneumonia -See severe sepsis  Tobacco abuse -Patient stated currently did not require nicotine patch  Pulmonary nodule -Once patient stable will further evaluate given her history of smoking  Paroxysmal atrial fibrillation -currently not rate controlled -Amiodarone drip -Eliquis 5 mg BID  HTN -Currently not an issue.  Secondary to patient's severe sepsis  CAD -Lipid panel pending -ASA 81 mg daily      Code Status: Full (DVT Prophylaxis: Eliquis Family Communication: Husband at bedside for discussion of plan of care answered all questions Status is: Inpatient    Dispo: The patient is from: Home              Anticipated d/c is to: Home              Anticipated d/c date is: 12/6              Patient currently  unstable     Data Reviewed: Care during the described time interval was provided by me .  I have reviewed this patient's available data, including medical history, events of note, physical examination, and all test results as part of my evaluation.   The patient is critically ill with multiple organ systems failure and requires high complexity  decision making for assessment and support, frequent evaluation and titration of therapies, application of advanced monitoring technologies and extensive interpretation of multiple databases. Critical Care Time devoted to patient care services described in this note  Time spent: 70 minutes   Michaelangelo Mittelman, Roselind Messier [facility] Pager [phone]

## 2020-03-01 ENCOUNTER — Encounter (HOSPITAL_COMMUNITY): Payer: Self-pay | Admitting: Internal Medicine

## 2020-03-01 ENCOUNTER — Other Ambulatory Visit: Payer: Self-pay

## 2020-03-01 DIAGNOSIS — J189 Pneumonia, unspecified organism: Secondary | ICD-10-CM

## 2020-03-01 DIAGNOSIS — F1910 Other psychoactive substance abuse, uncomplicated: Secondary | ICD-10-CM | POA: Diagnosis present

## 2020-03-01 HISTORY — DX: Pneumonia, unspecified organism: J18.9

## 2020-03-01 LAB — PROTIME-INR
INR: 1.8 — ABNORMAL HIGH (ref 0.8–1.2)
Prothrombin Time: 20.6 seconds — ABNORMAL HIGH (ref 11.4–15.2)

## 2020-03-01 LAB — POTASSIUM
Potassium: 3.5 mmol/L (ref 3.5–5.1)
Potassium: 3.9 mmol/L (ref 3.5–5.1)

## 2020-03-01 LAB — CBC WITH DIFFERENTIAL/PLATELET
Abs Immature Granulocytes: 0.34 10*3/uL — ABNORMAL HIGH (ref 0.00–0.07)
Basophils Absolute: 0 10*3/uL (ref 0.0–0.1)
Basophils Relative: 1 %
Eosinophils Absolute: 0 10*3/uL (ref 0.0–0.5)
Eosinophils Relative: 0 %
HCT: 43.1 % (ref 36.0–46.0)
Hemoglobin: 15.2 g/dL — ABNORMAL HIGH (ref 12.0–15.0)
Immature Granulocytes: 4 %
Lymphocytes Relative: 6 %
Lymphs Abs: 0.5 10*3/uL — ABNORMAL LOW (ref 0.7–4.0)
MCH: 31 pg (ref 26.0–34.0)
MCHC: 35.3 g/dL (ref 30.0–36.0)
MCV: 88 fL (ref 80.0–100.0)
Monocytes Absolute: 0.2 10*3/uL (ref 0.1–1.0)
Monocytes Relative: 2 %
Neutro Abs: 6.9 10*3/uL (ref 1.7–7.7)
Neutrophils Relative %: 87 %
Platelets: 199 10*3/uL (ref 150–400)
RBC: 4.9 MIL/uL (ref 3.87–5.11)
RDW: 13.3 % (ref 11.5–15.5)
WBC Morphology: INCREASED
WBC: 7.9 10*3/uL (ref 4.0–10.5)
nRBC: 0 % (ref 0.0–0.2)

## 2020-03-01 LAB — COMPREHENSIVE METABOLIC PANEL
ALT: 29 U/L (ref 0–44)
AST: 47 U/L — ABNORMAL HIGH (ref 15–41)
Albumin: 1.8 g/dL — ABNORMAL LOW (ref 3.5–5.0)
Alkaline Phosphatase: 128 U/L — ABNORMAL HIGH (ref 38–126)
Anion gap: 17 — ABNORMAL HIGH (ref 5–15)
BUN: 43 mg/dL — ABNORMAL HIGH (ref 8–23)
CO2: 18 mmol/L — ABNORMAL LOW (ref 22–32)
Calcium: 8.6 mg/dL — ABNORMAL LOW (ref 8.9–10.3)
Chloride: 101 mmol/L (ref 98–111)
Creatinine, Ser: 1.73 mg/dL — ABNORMAL HIGH (ref 0.44–1.00)
GFR, Estimated: 32 mL/min — ABNORMAL LOW (ref >60)
Glucose, Bld: 135 mg/dL — ABNORMAL HIGH (ref 70–99)
Potassium: 3 mmol/L — ABNORMAL LOW (ref 3.5–5.1)
Sodium: 136 mmol/L (ref 135–145)
Total Bilirubin: 2.7 mg/dL — ABNORMAL HIGH (ref 0.3–1.2)
Total Protein: 5.4 g/dL — ABNORMAL LOW (ref 6.5–8.1)

## 2020-03-01 LAB — PHOSPHORUS: Phosphorus: 3.1 mg/dL (ref 2.5–4.6)

## 2020-03-01 LAB — LIPID PANEL
Cholesterol: 71 mg/dL (ref 0–200)
HDL: <10 mg/dL — ABNORMAL LOW (ref >40)
Triglycerides: 237 mg/dL — ABNORMAL HIGH (ref <150)
VLDL: 47 mg/dL — ABNORMAL HIGH (ref 0–40)

## 2020-03-01 LAB — MAGNESIUM
Magnesium: 1.9 mg/dL (ref 1.7–2.4)
Magnesium: 1.6 mg/dL — ABNORMAL LOW (ref 1.7–2.4)
Magnesium: 2 mg/dL (ref 1.7–2.4)

## 2020-03-01 LAB — CORTISOL-AM, BLOOD: Cortisol - AM: 33.3 ug/dL — ABNORMAL HIGH (ref 6.7–22.6)

## 2020-03-01 LAB — PROCALCITONIN: Procalcitonin: 1.65 ng/mL

## 2020-03-01 MED ORDER — LEVALBUTEROL HCL 1.25 MG/0.5ML IN NEBU
1.2500 mg | INHALATION_SOLUTION | Freq: Four times a day (QID) | RESPIRATORY_TRACT | Status: DC
  Administered 2020-03-01 – 2020-03-03 (×7): 1.25 mg via RESPIRATORY_TRACT
  Filled 2020-03-01 (×7): qty 0.5

## 2020-03-01 MED ORDER — POTASSIUM CHLORIDE 10 MEQ/100ML IV SOLN
10.0000 meq | INTRAVENOUS | Status: AC
  Administered 2020-03-01 (×3): 10 meq via INTRAVENOUS
  Filled 2020-03-01 (×3): qty 100

## 2020-03-01 MED ORDER — POTASSIUM CHLORIDE 10 MEQ/100ML IV SOLN
10.0000 meq | INTRAVENOUS | Status: AC
  Administered 2020-03-01 (×4): 10 meq via INTRAVENOUS
  Filled 2020-03-01: qty 100

## 2020-03-01 MED ORDER — POTASSIUM CHLORIDE CRYS ER 20 MEQ PO TBCR
40.0000 meq | EXTENDED_RELEASE_TABLET | Freq: Once | ORAL | Status: AC
  Administered 2020-03-01: 40 meq via ORAL
  Filled 2020-03-01: qty 2

## 2020-03-01 MED ORDER — VENLAFAXINE HCL ER 75 MG PO CP24
75.0000 mg | ORAL_CAPSULE | Freq: Every day | ORAL | Status: DC
  Administered 2020-03-01 – 2020-03-05 (×5): 75 mg via ORAL
  Filled 2020-03-01 (×5): qty 1

## 2020-03-01 MED ORDER — ATENOLOL 25 MG PO TABS
12.5000 mg | ORAL_TABLET | Freq: Two times a day (BID) | ORAL | Status: DC
  Administered 2020-03-01 – 2020-03-03 (×5): 12.5 mg via ORAL
  Filled 2020-03-01 (×5): qty 1

## 2020-03-01 MED ORDER — MAGNESIUM SULFATE 4 GM/100ML IV SOLN
4.0000 g | Freq: Once | INTRAVENOUS | Status: AC
  Administered 2020-03-01: 4 g via INTRAVENOUS
  Filled 2020-03-01: qty 100

## 2020-03-01 NOTE — Progress Notes (Signed)
@  2344, Dr. Mitchell Heir, on-call for attending, paged regarding pt's most recent Magnesium level (2.0) and present infusion of Magnesium (4gm). Verbal order received to stop infusion to prevent elevated levels. Infusion stopped and will follow AM labs closely.

## 2020-03-01 NOTE — Progress Notes (Signed)
PROGRESS NOTE    Kari Harrison  XTA:[id number] DOB: 1955-08-25 DOA: 02/29/2020 PCP: Sigmund Hazel, MD     Brief Narrative:  64 y.o. WF PMHx CAD, HTN, A. fib on Eliquis, Pulmonary nodules, Tobacco abuse   Subjective: 12/2 afebrile overnight A/O x4.  States feels significantly improved.  Positive productive cough   Assessment & Plan: Covid vaccination; vaccinated   Active Problems:   Tobacco abuse   CAD in native artery   Hypertension   Pulmonary nodules   Paroxysmal atrial fibrillation (HCC)   Severe sepsis (HCC)   Pneumonia   Substance abuse (HCC)   Severe sepsis -On admission patient meets criteria for severe sepsis WBC> 12, HR> 90, RR> 20.  Lactic acid> 2.0 Site of infection lungs. -LR 180ml/hr -Continue current antibiotics -Trend procalcitonin/lactic acid; broaden antibiotics if required -Blood culture pending -Sputum culture pending -Titrate O2 to maintain SPO2> 92% -Xopenex QID -Solu-Medrol 60 mg daily -Flutter valve -Incentive spirometry -12/1 urine rapid drug screen positive marijuana  Pneumonia -See severe sepsis  Tobacco abuse -Patient stated currently did not require nicotine patch  Pulmonary nodule -Once patient stable will further evaluate given her history of smoking  Paroxysmal atrial fibrillation -currently not rate controlled -Amiodarone drip -12/2 Atenolol 12.5 mg BID (home dose 50 mg daily) -Eliquis 5 mg BID  HTN -Currently not an issue.  Secondary to patient's severe sepsis  CAD -Lipid panel pending -ASA 81 mg daily  Hypokalemia -Potassium goal> 4 -12/2 K-Dur 40 mEq + potassium IV 40 mEq -12/2 recheck K/Mg @1400  -12/2 potassium still low repeat K-Dur 40 mEq+ potassium IV 30 mEq -12/2 recheck K/Mg @1900  notify physician on call of findings  Hypomagnesmia -Magnesium goal> 2 -12/2 magnesium IV 4 g  Depression -12/2 venlafaxine 75 mg daily  Substance abuse -12/1 urine tox screen positive for  marijuana    DVT prophylaxis: Eliquis Code Status: Full Family Communication:  Status is: Inpatient    Dispo: The patient is from: Home              Anticipated d/c is to: Home              Anticipated d/c date is: 12/7              Patient currently unstable      Consultants:    Procedures/Significant Events:    I have personally reviewed and interpreted all radiology studies and my findings are as above.  VENTILATOR SETTINGS: Nasal cannula 12/2 Flow 3 L/min SPO2 95%   Cultures   Antimicrobials: Anti-infectives (From admission, onward)   Start     Ordered Stop   02/29/20 1745  cefTRIAXone (ROCEPHIN) 2 g in sodium chloride 0.9 % 100 mL IVPB  Status:  Discontinued        02/29/20 1743 02/29/20 1745   02/29/20 1745  azithromycin (ZITHROMAX) 500 mg in sodium chloride 0.9 % 250 mL IVPB  Status:  Discontinued        02/29/20 1743 02/29/20 1745   02/29/20 1500  cefTRIAXone (ROCEPHIN) 2 g in sodium chloride 0.9 % 100 mL IVPB        02/29/20 1457     02/29/20 1500  azithromycin (ZITHROMAX) 500 mg in sodium chloride 0.9 % 250 mL IVPB        02/29/20 1457        Devices    LINES / TUBES:      Continuous Infusions: . amiodarone 30 mg/hr (03/01/20 0230)  . azithromycin Stopped (02/29/20 1812)  .  cefTRIAXone (ROCEPHIN)  IV Stopped (02/29/20 1618)  . lactated ringers 150 mL/hr at 02/29/20 1812  . lactated ringers    . potassium chloride    . potassium chloride       Objective: Vitals:   02/29/20 2122 02/29/20 2225 02/29/20 2306 03/01/20 0000  BP: 107/71  105/71 111/81  Pulse: (!) 118 (!) 125 (!) 122 (!) 125  Resp: 18 18 19 16   Temp: 98.6 F (37 C)  99.7 F (37.6 C) 99.4 F (37.4 C)  TempSrc: Oral  Oral Oral  SpO2: 93% 93% 92% 95%  Weight:      Height:        Intake/Output Summary (Last 24 hours) at 03/01/2020 0744 Last data filed at 02/29/2020 2012 Gross per 24 hour  Intake 2850 ml  Output --  Net 2850 ml   Filed Weights   02/29/20 1530  02/29/20 2055  Weight: 76 kg 73.6 kg    Examination:  General: A/O x4, No acute respiratory distress Eyes: negative scleral hemorrhage, negative anisocoria, negative icterus ENT: Negative Runny nose, negative gingival bleeding, Neck:  Negative scars, masses, torticollis, lymphadenopathy, JVD Lungs: Clear to auscultation bilaterally without wheezes or crackles Cardiovascular: Regular rate and rhythm without murmur gallop or rub normal S1 and S2 Abdomen: negative abdominal pain, nondistended, positive soft, bowel sounds, no rebound, no ascites, no appreciable mass Extremities: No significant cyanosis, clubbing, or edema bilateral lower extremities Skin: Negative rashes, lesions, ulcers Psychiatric:  Negative depression, negative anxiety, negative fatigue, negative mania  Central nervous system:  Cranial nerves II through XII intact, tongue/uvula midline, all extremities muscle strength 5/5, sensation intact throughout, negative dysarthria, negative expressive aphasia, negative receptive aphasia.  .     Data Reviewed: Care during the described time interval was provided by me .  I have reviewed this patient's available data, including medical history, events of note, physical examination, and all test results as part of my evaluation.  CBC: Recent Labs  Lab 02/29/20 1531 02/29/20 1815 03/01/20 0305  WBC 13.1* 9.9 7.9  NEUTROABS 11.8*  --  6.9  HGB 17.3* 15.2* 15.2*  HCT 50.3* 44.0 43.1  MCV 89.5 89.8 88.0  PLT 240 219 199   Basic Metabolic Panel: Recent Labs  Lab 02/29/20 1531 02/29/20 1815 02/29/20 2224 03/01/20 0305  NA 134*  --   --  136  K 2.7*  --  2.9* 3.0*  CL 94*  --   --  101  CO2 21*  --   --  18*  GLUCOSE 140*  --   --  135*  BUN 50*  --   --  43*  CREATININE 2.63* 2.20*  --  1.73*  CALCIUM 9.3  --   --  8.6*  MG  --   --  1.7 1.9  PHOS  --   --   --  3.1   GFR: Estimated Creatinine Clearance: 30.5 mL/min (A) (by C-G formula based on SCr of 1.73 mg/dL  (H)). Liver Function Tests: Recent Labs  Lab 02/29/20 1531 03/01/20 0305  AST 47* 47*  ALT 30 29  ALKPHOS 134* 128*  BILITOT 2.9* 2.7*  PROT 7.2 5.4*  ALBUMIN 2.4* 1.8*   No results for input(s): LIPASE, AMYLASE in the last 168 hours. No results for input(s): AMMONIA in the last 168 hours. Coagulation Profile: Recent Labs  Lab 02/29/20 1531 03/01/20 0305  INR 1.7* 1.8*   Cardiac Enzymes: No results for input(s): CKTOTAL, CKMB, CKMBINDEX, TROPONINI in the last 168 hours.  BNP (last 3 results) No results for input(s): PROBNP in the last 8760 hours. HbA1C: No results for input(s): HGBA1C in the last 72 hours. CBG: No results for input(s): GLUCAP in the last 168 hours. Lipid Profile: Recent Labs    03/01/20 0305  CHOL 71  HDL <10*  LDLCALC NOT CALCULATED  TRIG 237*  CHOLHDL NOT CALCULATED   Thyroid Function Tests: No results for input(s): TSH, T4TOTAL, FREET4, T3FREE, THYROIDAB in the last 72 hours. Anemia Panel: No results for input(s): VITAMINB12, FOLATE, FERRITIN, TIBC, IRON, RETICCTPCT in the last 72 hours. Sepsis Labs: Recent Labs  Lab 02/29/20 1531 02/29/20 1815 02/29/20 2224  PROCALCITON  --  2.06  --   LATICACIDVEN 3.5* 2.3* 2.2*    Recent Results (from the past 240 hour(s))  Resp Panel by RT-PCR (Flu A&B, Covid) Nasopharyngeal Swab     Status: None   Collection Time: 02/29/20  3:51 PM   Specimen: Nasopharyngeal Swab; Nasopharyngeal(NP) swabs in vial transport medium  Result Value Ref Range Status   SARS Coronavirus 2 by RT PCR NEGATIVE NEGATIVE Final    Comment: (NOTE) SARS-CoV-2 target nucleic acids are NOT DETECTED.  The SARS-CoV-2 RNA is generally detectable in upper respiratory specimens during the acute phase of infection. The lowest concentration of SARS-CoV-2 viral copies this assay can detect is 138 copies/mL. A negative result does not preclude SARS-Cov-2 infection and should not be used as the sole basis for treatment or other patient  management decisions. A negative result may occur with  improper specimen collection/handling, submission of specimen other than nasopharyngeal swab, presence of viral mutation(s) within the areas targeted by this assay, and inadequate number of viral copies(<138 copies/mL). A negative result must be combined with clinical observations, patient history, and epidemiological information. The expected result is Negative.  Fact Sheet for Patients:  BloggerCourse.comhttps://www.fda.gov/media/152166/download  Fact Sheet for Healthcare Providers:  SeriousBroker.ithttps://www.fda.gov/media/152162/download  This test is no t yet approved or cleared by the [place] FDA and  has been authorized for detection and/or diagnosis of SARS-CoV-2 by FDA under an Emergency Use Authorization (EUA). This EUA will remain  in effect (meaning this test can be used) for the duration of the COVID-19 declaration under Section 564(b)(1) of the Act, 21 U.S.C.section 360bbb-3(b)(1), unless the authorization is terminated  or revoked sooner.       Influenza A by PCR NEGATIVE NEGATIVE Final   Influenza B by PCR NEGATIVE NEGATIVE Final    Comment: (NOTE) The Xpert Xpress SARS-CoV-2/FLU/RSV plus assay is intended as an aid in the diagnosis of influenza from Nasopharyngeal swab specimens and should not be used as a sole basis for treatment. Nasal washings and aspirates are unacceptable for Xpert Xpress SARS-CoV-2/FLU/RSV testing.  Fact Sheet for Patients: BloggerCourse.comhttps://www.fda.gov/media/152166/download  Fact Sheet for Healthcare Providers: SeriousBroker.ithttps://www.fda.gov/media/152162/download  This test is not yet approved or cleared by the [place] FDA and has been authorized for detection and/or diagnosis of SARS-CoV-2 by FDA under an Emergency Use Authorization (EUA). This EUA will remain in effect (meaning this test can be used) for the duration of the COVID-19 declaration under Section 564(b)(1) of the Act, 21 U.S.C. section 360bbb-3(b)(1),  unless the authorization is terminated or revoked.  Performed at [facility], [address] [place], [place] [address]          Radiology Studies: DG Chest Port 1 View  Result Date: 02/29/2020 CLINICAL DATA:  Sepsis, cough, fever, previous abnormal chest x-ray EXAM: PORTABLE CHEST 1 VIEW COMPARISON:  02/29/2020 at 1:14  p.m. FINDINGS: Single frontal view of the chest was performed. The image is mislabeled by the technologist, as numerous prior exams demonstrate the cardiac apex points toward the left. The cardiac silhouette is unremarkable. There is dense left lower lobe consolidation consistent with pneumonia. Small left effusion cannot be excluded. No pneumothorax. Right chest is clear. IMPRESSION: 1. Dense left lower lobe consolidation most compatible with pneumonia. Followup PA and lateral chest X-ray is recommended in 3-4 weeks following trial of antibiotic therapy to ensure resolution and exclude underlying malignancy. Electronically Signed   By: Sharlet Salina M.D.   On: 02/29/2020 15:36        Scheduled Meds: . apixaban  5 mg Oral BID  . vitamin C  500 mg Oral Daily  . aspirin EC  81 mg Oral Daily  . atenolol  12.5 mg Oral BID  . levalbuterol  1.25 mg Nebulization Q6H  . methylPREDNISolone (SOLU-MEDROL) injection  60 mg Intravenous Q24H  . multivitamin with minerals  1 tablet Oral Daily  . potassium chloride  40 mEq Oral Once  . venlafaxine XR  75 mg Oral Q breakfast  . zinc sulfate  220 mg Oral Daily   Continuous Infusions: . amiodarone 30 mg/hr (03/01/20 0230)  . azithromycin Stopped (02/29/20 1812)  . cefTRIAXone (ROCEPHIN)  IV Stopped (02/29/20 1618)  . lactated ringers 150 mL/hr at 02/29/20 1812  . lactated ringers    . potassium chloride    . potassium chloride       LOS: 1 day    Time spent:40 min    Hulda Reddix, Roselind Messier, MD [facility] Pager [phone]  If 7PM-7AM, please contact night-coverage [email or web address] Password  [facility] 03/01/2020, 7:44 AM

## 2020-03-02 LAB — CBC WITH DIFFERENTIAL/PLATELET
Abs Immature Granulocytes: 0.73 10*3/uL — ABNORMAL HIGH (ref 0.00–0.07)
Basophils Absolute: 0.1 10*3/uL (ref 0.0–0.1)
Basophils Relative: 1 %
Eosinophils Absolute: 0 10*3/uL (ref 0.0–0.5)
Eosinophils Relative: 0 %
HCT: 37.2 % (ref 36.0–46.0)
Hemoglobin: 13.5 g/dL (ref 12.0–15.0)
Immature Granulocytes: 8 %
Lymphocytes Relative: 9 %
Lymphs Abs: 0.8 10*3/uL (ref 0.7–4.0)
MCH: 31.5 pg (ref 26.0–34.0)
MCHC: 36.3 g/dL — ABNORMAL HIGH (ref 30.0–36.0)
MCV: 86.9 fL (ref 80.0–100.0)
Monocytes Absolute: 0.3 10*3/uL (ref 0.1–1.0)
Monocytes Relative: 4 %
Neutro Abs: 7 10*3/uL (ref 1.7–7.7)
Neutrophils Relative %: 78 %
Platelets: 177 10*3/uL (ref 150–400)
RBC: 4.28 MIL/uL (ref 3.87–5.11)
RDW: 13.6 % (ref 11.5–15.5)
WBC: 9 10*3/uL (ref 4.0–10.5)
nRBC: 0 % (ref 0.0–0.2)

## 2020-03-02 LAB — URINE CULTURE

## 2020-03-02 LAB — COMPREHENSIVE METABOLIC PANEL
ALT: 44 U/L (ref 0–44)
AST: 85 U/L — ABNORMAL HIGH (ref 15–41)
Albumin: 1.9 g/dL — ABNORMAL LOW (ref 3.5–5.0)
Alkaline Phosphatase: 167 U/L — ABNORMAL HIGH (ref 38–126)
Anion gap: 11 (ref 5–15)
BUN: 36 mg/dL — ABNORMAL HIGH (ref 8–23)
CO2: 18 mmol/L — ABNORMAL LOW (ref 22–32)
Calcium: 8.8 mg/dL — ABNORMAL LOW (ref 8.9–10.3)
Chloride: 105 mmol/L (ref 98–111)
Creatinine, Ser: 1.22 mg/dL — ABNORMAL HIGH (ref 0.44–1.00)
GFR, Estimated: 49 mL/min — ABNORMAL LOW (ref >60)
Glucose, Bld: 148 mg/dL — ABNORMAL HIGH (ref 70–99)
Potassium: 4 mmol/L (ref 3.5–5.1)
Sodium: 134 mmol/L — ABNORMAL LOW (ref 135–145)
Total Bilirubin: 2 mg/dL — ABNORMAL HIGH (ref 0.3–1.2)
Total Protein: 5.5 g/dL — ABNORMAL LOW (ref 6.5–8.1)

## 2020-03-02 LAB — PROCALCITONIN: Procalcitonin: 0.99 ng/mL

## 2020-03-02 LAB — MAGNESIUM: Magnesium: 2 mg/dL (ref 1.7–2.4)

## 2020-03-02 LAB — PHOSPHORUS: Phosphorus: 2 mg/dL — ABNORMAL LOW (ref 2.5–4.6)

## 2020-03-02 MED ORDER — K PHOS MONO-SOD PHOS DI & MONO 155-852-130 MG PO TABS
500.0000 mg | ORAL_TABLET | Freq: Two times a day (BID) | ORAL | Status: AC
  Administered 2020-03-02 (×2): 500 mg via ORAL
  Filled 2020-03-02 (×2): qty 2

## 2020-03-02 MED ORDER — AZITHROMYCIN 500 MG PO TABS
500.0000 mg | ORAL_TABLET | Freq: Every day | ORAL | Status: AC
  Administered 2020-03-03 – 2020-03-04 (×2): 500 mg via ORAL
  Filled 2020-03-02 (×3): qty 1

## 2020-03-02 MED ORDER — CEFDINIR 300 MG PO CAPS
300.0000 mg | ORAL_CAPSULE | Freq: Two times a day (BID) | ORAL | Status: AC
  Administered 2020-03-03 – 2020-03-04 (×4): 300 mg via ORAL
  Filled 2020-03-02 (×4): qty 1

## 2020-03-02 MED ORDER — SODIUM CHLORIDE 0.9 % IV SOLN
500.0000 mg | INTRAVENOUS | Status: AC
  Filled 2020-03-02: qty 500

## 2020-03-02 NOTE — Progress Notes (Signed)
PROGRESS NOTE    Kari Harrison  WPV:[id number] DOB: 04/18/1955 DOA: 02/29/2020 PCP: Sigmund Hazel, MD     Brief Narrative:  64 y.o. WF PMHx CAD, HTN, A. fib on Eliquis, Pulmonary nodules, Tobacco abuse   Subjective: 12/3 afebrile overnight, A/O x4, feels significantly improved.  Joking with staff and husband.  Positive cough   Assessment & Plan: Covid vaccination; vaccinated   Active Problems:   Tobacco abuse   CAD in native artery   Hypertension   Pulmonary nodules   Paroxysmal atrial fibrillation (HCC)   Severe sepsis (HCC)   Pneumonia   Substance abuse (HCC)   Severe sepsis -On admission patient meets criteria for severe sepsis WBC> 12, HR> 90, RR> 20.  Lactic acid> 2.0 Site of infection lungs. -LR 122ml/hr -Continue current antibiotics -Trend procalcitonin/lactic acid; broaden antibiotics if required -Blood culture pending -Sputum culture pending -Titrate O2 to maintain SPO2> 92% -Xopenex QID -Solu-Medrol 60 mg daily -Flutter valve -Incentive spirometry -12/1 urine rapid drug screen positive marijuana  Pneumonia -See severe sepsis  Tobacco abuse -Patient stated currently did not require nicotine patch  Pulmonary nodule -Once patient stable will further evaluate given her history of smoking  Paroxysmal atrial fibrillation -currently not rate controlled -Amiodarone drip -12/2 Atenolol 12.5 mg BID (home dose 50 mg daily) -Eliquis 5 mg BID  HTN -Currently not an issue.  Secondary to patient's severe sepsis  CAD -Lipid panel pending -ASA 81 mg daily  Hypokalemia -Potassium goal> 4 -12/2 K-Dur 40 mEq + potassium IV 40 mEq -12/2 recheck K/Mg @1400  -12/2 potassium still low repeat K-Dur 40 mEq+ potassium IV 30 mEq -12/2 recheck K/Mg @1900  notify physician on call of findings  Hypomagnesmia -Magnesium goal> 2 -12/2 magnesium IV 4 g  Hypophosphatemia -12/3 K-Phos 500 mg BID x2 doses  Depression -12/2 venlafaxine 75 mg  daily  Substance abuse -12/1 urine tox screen positive for marijuana    DVT prophylaxis: Eliquis Code Status: Full Family Communication: 12/3 husband at bedside for discussion of plan of care answered all questions Status is: Inpatient    Dispo: The patient is from: Home              Anticipated d/c is to: Home              Anticipated d/c date is: 12/7              Patient currently unstable      Consultants:    Procedures/Significant Events:    I have personally reviewed and interpreted all radiology studies and my findings are as above.  VENTILATOR SETTINGS: Nasal cannula 12/2 Flow 3 L/min SPO2 95%   Cultures   Antimicrobials: Anti-infectives (From admission, onward)   Start     Ordered Stop   02/29/20 1745  cefTRIAXone (ROCEPHIN) 2 g in sodium chloride 0.9 % 100 mL IVPB  Status:  Discontinued        02/29/20 1743 02/29/20 1745   02/29/20 1745  azithromycin (ZITHROMAX) 500 mg in sodium chloride 0.9 % 250 mL IVPB  Status:  Discontinued        02/29/20 1743 02/29/20 1745   02/29/20 1500  cefTRIAXone (ROCEPHIN) 2 g in sodium chloride 0.9 % 100 mL IVPB        02/29/20 1457     02/29/20 1500  azithromycin (ZITHROMAX) 500 mg in sodium chloride 0.9 % 250 mL IVPB        02/29/20 1457        Devices  LINES / TUBES:      Continuous Infusions: . amiodarone 30 mg/hr (03/02/20 0600)  . azithromycin       Objective: Vitals:   03/02/20 0436 03/02/20 0742 03/02/20 0830 03/02/20 1138  BP:   125/79 121/79  Pulse:   93 88  Resp:   20 20  Temp:   98.3 F (36.8 C) 98.3 F (36.8 C)  TempSrc:   Oral Oral  SpO2:  93% 100% 94%  Weight: 76.3 kg     Height:        Intake/Output Summary (Last 24 hours) at 03/02/2020 1324 Last data filed at 03/02/2020 0600 Gross per 24 hour  Intake 1431.05 ml  Output --  Net 1431.05 ml   Filed Weights   02/29/20 2055 03/01/20 1756 03/02/20 0436  Weight: 73.6 kg 75.8 kg 76.3 kg    Examination:  General: A/O x4,  No acute respiratory distress Eyes: negative scleral hemorrhage, negative anisocoria, negative icterus ENT: Negative Runny nose, negative gingival bleeding, Neck:  Negative scars, masses, torticollis, lymphadenopathy, JVD Lungs: Clear to auscultation bilaterally without wheezes or crackles Cardiovascular: Regular rate and rhythm without murmur gallop or rub normal S1 and S2 Abdomen: negative abdominal pain, nondistended, positive soft, bowel sounds, no rebound, no ascites, no appreciable mass Extremities: No significant cyanosis, clubbing, or edema bilateral lower extremities Skin: Negative rashes, lesions, ulcers Psychiatric:  Negative depression, negative anxiety, negative fatigue, negative mania  Central nervous system:  Cranial nerves II through XII intact, tongue/uvula midline, all extremities muscle strength 5/5, sensation intact throughout, negative dysarthria, negative expressive aphasia, negative receptive aphasia.  .     Data Reviewed: Care during the described time interval was provided by me .  I have reviewed this patient's available data, including medical history, events of note, physical examination, and all test results as part of my evaluation.  CBC: Recent Labs  Lab 02/29/20 1531 02/29/20 1815 03/01/20 0305 03/02/20 0210  WBC 13.1* 9.9 7.9 9.0  NEUTROABS 11.8*  --  6.9 7.0  HGB 17.3* 15.2* 15.2* 13.5  HCT 50.3* 44.0 43.1 37.2  MCV 89.5 89.8 88.0 86.9  PLT 240 219 199 177   Basic Metabolic Panel: Recent Labs  Lab 02/29/20 1531 02/29/20 1531 02/29/20 1815 02/29/20 2224 03/01/20 0305 03/01/20 1339 03/01/20 1820 03/02/20 0210  NA 134*  --   --   --  136  --   --  134*  K 2.7*   < >  --  2.9* 3.0* 3.5 3.9 4.0  CL 94*  --   --   --  101  --   --  105  CO2 21*  --   --   --  18*  --   --  18*  GLUCOSE 140*  --   --   --  135*  --   --  148*  BUN 50*  --   --   --  43*  --   --  36*  CREATININE 2.63*  --  2.20*  --  1.73*  --   --  1.22*  CALCIUM 9.3  --    --   --  8.6*  --   --  8.8*  MG  --   --   --  1.7 1.9 1.6* 2.0 2.0  PHOS  --   --   --   --  3.1  --   --  2.0*   < > = values in this interval not displayed.   GFR:  Estimated Creatinine Clearance: 44 mL/min (A) (by C-G formula based on SCr of 1.22 mg/dL (H)). Liver Function Tests: Recent Labs  Lab 02/29/20 1531 03/01/20 0305 03/02/20 0210  AST 47* 47* 85*  ALT 30 29 44  ALKPHOS 134* 128* 167*  BILITOT 2.9* 2.7* 2.0*  PROT 7.2 5.4* 5.5*  ALBUMIN 2.4* 1.8* 1.9*   No results for input(s): LIPASE, AMYLASE in the last 168 hours. No results for input(s): AMMONIA in the last 168 hours. Coagulation Profile: Recent Labs  Lab 02/29/20 1531 03/01/20 0305  INR 1.7* 1.8*   Cardiac Enzymes: No results for input(s): CKTOTAL, CKMB, CKMBINDEX, TROPONINI in the last 168 hours. BNP (last 3 results) No results for input(s): PROBNP in the last 8760 hours. HbA1C: No results for input(s): HGBA1C in the last 72 hours. CBG: No results for input(s): GLUCAP in the last 168 hours. Lipid Profile: Recent Labs    03/01/20 0305  CHOL 71  HDL <10*  LDLCALC NOT CALCULATED  TRIG 237*  CHOLHDL NOT CALCULATED   Thyroid Function Tests: No results for input(s): TSH, T4TOTAL, FREET4, T3FREE, THYROIDAB in the last 72 hours. Anemia Panel: No results for input(s): VITAMINB12, FOLATE, FERRITIN, TIBC, IRON, RETICCTPCT in the last 72 hours. Sepsis Labs: Recent Labs  Lab 02/29/20 1531 02/29/20 1815 02/29/20 2224 03/01/20 0648 03/02/20 0210  PROCALCITON  --  2.06  --  1.65 0.99  LATICACIDVEN 3.5* 2.3* 2.2*  --   --     Recent Results (from the past 240 hour(s))  Blood Culture (routine x 2)     Status: None (Preliminary result)   Collection Time: 02/29/20  3:31 PM   Specimen: BLOOD  Result Value Ref Range Status   Specimen Description BLOOD LEFT ANTECUBITAL  Final   Special Requests   Final    BOTTLES DRAWN AEROBIC AND ANAEROBIC Blood Culture results may not be optimal due to an inadequate  volume of blood received in culture bottles   Culture   Final    NO GROWTH 2 DAYS Performed at [facility], [address] [place], [place] [address]    Report Status PENDING  Incomplete  Blood Culture (routine x 2)     Status: None (Preliminary result)   Collection Time: 02/29/20  3:31 PM   Specimen: BLOOD  Result Value Ref Range Status   Specimen Description BLOOD RIGHT ANTECUBITAL  Final   Special Requests   Final    BOTTLES DRAWN AEROBIC AND ANAEROBIC Blood Culture results may not be optimal due to an inadequate volume of blood received in culture bottles   Culture   Final    NO GROWTH 2 DAYS Performed at [facility], [address], [place], [place] [address]    Report Status PENDING  Incomplete  Resp Panel by RT-PCR (Flu A&B, Covid) Nasopharyngeal Swab     Status: None   Collection Time: 02/29/20  3:51 PM   Specimen: Nasopharyngeal Swab; Nasopharyngeal(NP) swabs in vial transport medium  Result Value Ref Range Status   SARS Coronavirus 2 by RT PCR NEGATIVE NEGATIVE Final    Comment: (NOTE) SARS-CoV-2 target nucleic acids are NOT DETECTED.  The SARS-CoV-2 RNA is generally detectable in upper respiratory specimens during the acute phase of infection. The lowest concentration of SARS-CoV-2 viral copies this assay can detect is 138 copies/mL. A negative result does not preclude SARS-Cov-2 infection and should not be used as the sole basis for treatment or other patient management decisions. A negative result may occur  with  improper specimen collection/handling, submission of specimen other than nasopharyngeal swab, presence of viral mutation(s) within the areas targeted by this assay, and inadequate number of viral copies(<138 copies/mL). A negative result must be combined with clinical observations, patient history, and epidemiological information. The expected result is Negative.  Fact Sheet for Patients:  BloggerCourse.com  Fact  Sheet for Healthcare Providers:  SeriousBroker.it  This test is no t yet approved or cleared by the [place] FDA and  has been authorized for detection and/or diagnosis of SARS-CoV-2 by FDA under an Emergency Use Authorization (EUA). This EUA will remain  in effect (meaning this test can be used) for the duration of the COVID-19 declaration under Section 564(b)(1) of the Act, 21 U.S.C.section 360bbb-3(b)(1), unless the authorization is terminated  or revoked sooner.       Influenza A by PCR NEGATIVE NEGATIVE Final   Influenza B by PCR NEGATIVE NEGATIVE Final    Comment: (NOTE) The Xpert Xpress SARS-CoV-2/FLU/RSV plus assay is intended as an aid in the diagnosis of influenza from Nasopharyngeal swab specimens and should not be used as a sole basis for treatment. Nasal washings and aspirates are unacceptable for Xpert Xpress SARS-CoV-2/FLU/RSV testing.  Fact Sheet for Patients: BloggerCourse.com  Fact Sheet for Healthcare Providers: SeriousBroker.it  This test is not yet approved or cleared by the [place] FDA and has been authorized for detection and/or diagnosis of SARS-CoV-2 by FDA under an Emergency Use Authorization (EUA). This EUA will remain in effect (meaning this test can be used) for the duration of the COVID-19 declaration under Section 564(b)(1) of the Act, 21 U.S.C. section 360bbb-3(b)(1), unless the authorization is terminated or revoked.  Performed at [facility], [address], [place], [place] [address]   Urine culture     Status: Abnormal   Collection Time: 02/29/20  8:21 PM   Specimen: In/Out Cath Urine  Result Value Ref Range Status   Specimen Description IN/OUT CATH URINE  Final   Special Requests   Final    NONE Performed at [facility], [address], [place], [place] [address]    Culture MULTIPLE SPECIES PRESENT, SUGGEST RECOLLECTION (A)  Final    Report Status 03/02/2020 FINAL  Final         Radiology Studies: DG Chest Port 1 View  Result Date: 02/29/2020 CLINICAL DATA:  Sepsis, cough, fever, previous abnormal chest x-ray EXAM: PORTABLE CHEST 1 VIEW COMPARISON:  02/29/2020 at 1:14 p.m. FINDINGS: Single frontal view of the chest was performed. The image is mislabeled by the technologist, as numerous prior exams demonstrate the cardiac apex points toward the left. The cardiac silhouette is unremarkable. There is dense left lower lobe consolidation consistent with pneumonia. Small left effusion cannot be excluded. No pneumothorax. Right chest is clear. IMPRESSION: 1. Dense left lower lobe consolidation most compatible with pneumonia. Followup PA and lateral chest X-ray is recommended in 3-4 weeks following trial of antibiotic therapy to ensure resolution and exclude underlying malignancy. Electronically Signed   By: Sharlet Salina M.D.   On: 02/29/2020 15:36        Scheduled Meds: . apixaban  5 mg Oral BID  . vitamin C  500 mg Oral Daily  . aspirin EC  81 mg Oral Daily  . atenolol  12.5 mg Oral BID  . [START ON 03/03/2020] azithromycin  500 mg Oral Daily  . [START ON 03/03/2020] cefdinir  300 mg Oral Q12H  . levalbuterol  1.25 mg Nebulization Q6H  .  methylPREDNISolone (SOLU-MEDROL) injection  60 mg Intravenous Q24H  . multivitamin with minerals  1 tablet Oral Daily  . phosphorus  500 mg Oral BID  . venlafaxine XR  75 mg Oral Q breakfast  . zinc sulfate  220 mg Oral Daily   Continuous Infusions: . amiodarone 30 mg/hr (03/02/20 0600)  . azithromycin       LOS: 2 days    Time spent:40 min    Matheson Vandehei, Roselind Messier, MD [facility] Pager [phone]  If 7PM-7AM, please contact night-coverage [email or web address] Password [email or web address] 03/02/2020, 1:24 PM

## 2020-03-02 NOTE — Progress Notes (Signed)
Mobility Specialist: Progress Note   03/02/20 1159  Mobility  Activity Ambulated in hall  Level of Assistance Contact guard assist, steadying assist  Assistive Device None  Distance Ambulated (ft) 200 ft  Mobility Response Tolerated well  Mobility performed by Mobility specialist  Bed Position Semi-fowlers  $Mobility charge 1 Mobility   Pre-Mobility: 95 HR, 117/83 BP, 95% SpO2 Post-Mobility: 97 HR, 153/86 BP, 94% SpO2  Pt said she felt slightly dizzy after transferring from supine to sitting on EOB. Pt was a little wobbly during ambulation which she recognized as well. I recommend pt using RW for continued ambulation to increase stability.   [facility] Delquan Poucher Mobility Specialist

## 2020-03-03 LAB — CBC WITH DIFFERENTIAL/PLATELET
Abs Immature Granulocytes: 1.14 10*3/uL — ABNORMAL HIGH (ref 0.00–0.07)
Basophils Absolute: 0.1 10*3/uL (ref 0.0–0.1)
Basophils Relative: 1 %
Eosinophils Absolute: 0 10*3/uL (ref 0.0–0.5)
Eosinophils Relative: 0 %
HCT: 35.6 % — ABNORMAL LOW (ref 36.0–46.0)
Hemoglobin: 13 g/dL (ref 12.0–15.0)
Immature Granulocytes: 13 %
Lymphocytes Relative: 13 %
Lymphs Abs: 1.1 10*3/uL (ref 0.7–4.0)
MCH: 31.9 pg (ref 26.0–34.0)
MCHC: 36.5 g/dL — ABNORMAL HIGH (ref 30.0–36.0)
MCV: 87.5 fL (ref 80.0–100.0)
Monocytes Absolute: 0.7 10*3/uL (ref 0.1–1.0)
Monocytes Relative: 8 %
Neutro Abs: 5.5 10*3/uL (ref 1.7–7.7)
Neutrophils Relative %: 65 %
Platelets: 156 10*3/uL (ref 150–400)
RBC: 4.07 MIL/uL (ref 3.87–5.11)
RDW: 14.1 % (ref 11.5–15.5)
WBC: 8.5 10*3/uL (ref 4.0–10.5)
nRBC: 0 % (ref 0.0–0.2)

## 2020-03-03 LAB — COMPREHENSIVE METABOLIC PANEL
ALT: 142 U/L — ABNORMAL HIGH (ref 0–44)
AST: 251 U/L — ABNORMAL HIGH (ref 15–41)
Albumin: 1.8 g/dL — ABNORMAL LOW (ref 3.5–5.0)
Alkaline Phosphatase: 212 U/L — ABNORMAL HIGH (ref 38–126)
Anion gap: 12 (ref 5–15)
BUN: 30 mg/dL — ABNORMAL HIGH (ref 8–23)
CO2: 23 mmol/L (ref 22–32)
Calcium: 8.8 mg/dL — ABNORMAL LOW (ref 8.9–10.3)
Chloride: 104 mmol/L (ref 98–111)
Creatinine, Ser: 1.07 mg/dL — ABNORMAL HIGH (ref 0.44–1.00)
GFR, Estimated: 58 mL/min — ABNORMAL LOW (ref >60)
Glucose, Bld: 146 mg/dL — ABNORMAL HIGH (ref 70–99)
Potassium: 3.3 mmol/L — ABNORMAL LOW (ref 3.5–5.1)
Sodium: 139 mmol/L (ref 135–145)
Total Bilirubin: 1.3 mg/dL — ABNORMAL HIGH (ref 0.3–1.2)
Total Protein: 5.1 g/dL — ABNORMAL LOW (ref 6.5–8.1)

## 2020-03-03 LAB — MAGNESIUM: Magnesium: 1.8 mg/dL (ref 1.7–2.4)

## 2020-03-03 LAB — PHOSPHORUS: Phosphorus: 2.7 mg/dL (ref 2.5–4.6)

## 2020-03-03 MED ORDER — MAGNESIUM SULFATE 2 GM/50ML IV SOLN
2.0000 g | Freq: Once | INTRAVENOUS | Status: AC
  Administered 2020-03-03: 2 g via INTRAVENOUS
  Filled 2020-03-03: qty 50

## 2020-03-03 MED ORDER — POTASSIUM CHLORIDE 10 MEQ/100ML IV SOLN
10.0000 meq | INTRAVENOUS | Status: AC
  Administered 2020-03-03 (×4): 10 meq via INTRAVENOUS
  Filled 2020-03-03 (×4): qty 100

## 2020-03-03 MED ORDER — ATENOLOL 25 MG PO TABS
50.0000 mg | ORAL_TABLET | Freq: Every day | ORAL | Status: DC
  Administered 2020-03-04 – 2020-03-05 (×2): 50 mg via ORAL
  Filled 2020-03-03 (×2): qty 2

## 2020-03-03 MED ORDER — LEVALBUTEROL HCL 1.25 MG/0.5ML IN NEBU: 1.2500 mg | INHALATION_SOLUTION | Freq: Three times a day (TID) | RESPIRATORY_TRACT | Status: DC | PRN

## 2020-03-03 MED ORDER — POTASSIUM CHLORIDE CRYS ER 20 MEQ PO TBCR
40.0000 meq | EXTENDED_RELEASE_TABLET | Freq: Once | ORAL | Status: AC
  Administered 2020-03-03: 40 meq via ORAL
  Filled 2020-03-03: qty 2

## 2020-03-03 NOTE — Progress Notes (Signed)
PROGRESS NOTE    Kari Harrison  VOZ:[id number] DOB: [birth year] DOA: 02/29/2020 PCP: Sigmund Hazel, MD     Brief Narrative:  64 y.o. WF PMHx CAD, HTN, A. fib on Eliquis, Pulmonary nodules, Tobacco abuse   Subjective: 12/4 afebrile overnight afebrile overnight, A/O x4.  Assessment & Plan: Covid vaccination; vaccinated   Active Problems:   Tobacco abuse   CAD in native artery   Hypertension   Pulmonary nodules   Paroxysmal atrial fibrillation (HCC)   Severe sepsis (HCC)   Pneumonia   Substance abuse (HCC)   Severe sepsis -On admission patient meets criteria for severe sepsis WBC> 12, HR> 90, RR> 20.  Lactic acid> 2.0 Site of infection lungs. -LR 134ml/hr -Continue current antibiotics -Trend procalcitonin/lactic acid; broaden antibiotics if required -Blood culture pending -Sputum culture pending -Titrate O2 to maintain SPO2> 92% -Xopenex QID -Solu-Medrol 60 mg daily -Flutter valve -Incentive spirometry -12/1 urine rapid drug screen positive marijuana  Pneumonia -See severe sepsis  Tobacco abuse -Patient stated currently did not require nicotine patch  Pulmonary nodule -Once patient stable will further evaluate given her history of smoking  Paroxysmal atrial fibrillation -currently not rate controlled -12/4 DC Amiodarone drip -12/4 increase Atenolol 50 mg daily which is patient's home dose -Eliquis 5 mg BID  HTN -Currently not an issue.  Secondary to patient's severe sepsis  CAD -Lipid panel pending -ASA 81 mg daily  Hypokalemia -Potassium goal> 4 -12/4 K-Dur 40 mEq + potassium IV 40 mEq  Hypomagnesmia -Magnesium goal> 2 -12/4 Magnesium IV 2 g  Hypophosphatemia -Goal phosphorus> 2.5  Depression -12/2 venlafaxine 75 mg daily  Substance abuse -12/1 urine tox screen positive for marijuana  Goals of care -12/4 PT/OT consult;Patient with severe sepsis evaluate for [facility] vs SNF.  Work with patient daily   DVT prophylaxis: Eliquis  Code Status: Full Family Communication: 12/3 husband at bedside for discussion of plan of care answered all questions Status is: Inpatient    Dispo: The patient is from: Home              Anticipated d/c is to: Home              Anticipated d/c date is: 12/7              Patient currently unstable      Consultants:    Procedures/Significant Events:    I have personally reviewed and interpreted all radiology studies and my findings are as above.  VENTILATOR SETTINGS: Room air 12/4    Cultures   Antimicrobials: Anti-infectives (From admission, onward)   Start     Ordered Stop   02/29/20 1745  cefTRIAXone (ROCEPHIN) 2 g in sodium chloride 0.9 % 100 mL IVPB  Status:  Discontinued        02/29/20 1743 02/29/20 1745   02/29/20 1745  azithromycin (ZITHROMAX) 500 mg in sodium chloride 0.9 % 250 mL IVPB  Status:  Discontinued        02/29/20 1743 02/29/20 1745   02/29/20 1500  cefTRIAXone (ROCEPHIN) 2 g in sodium chloride 0.9 % 100 mL IVPB        02/29/20 1457     02/29/20 1500  azithromycin (ZITHROMAX) 500 mg in sodium chloride 0.9 % 250 mL IVPB        02/29/20 1457        Devices    LINES / TUBES:      Continuous Infusions: . amiodarone 30 mg/hr (03/03/20 0058)  . azithromycin  Objective: Vitals:   03/03/20 0317 03/03/20 0802 03/03/20 0807 03/03/20 1217  BP: (!) 126/94 117/83  140/87  Pulse: 91 94  85  Resp: 19 20  20   Temp: 98.3 F (36.8 C) 97.9 F (36.6 C)  97.9 F (36.6 C)  TempSrc: Oral Oral  Oral  SpO2: 93% 93% 95% 99%  Weight:      Height:        Intake/Output Summary (Last 24 hours) at 03/03/2020 1316 Last data filed at 03/03/2020 0900 Gross per 24 hour  Intake 524.16 ml  Output -  Net 524.16 ml   Filed Weights   02/29/20 2055 03/01/20 1756 03/02/20 0436  Weight: 73.6 kg 75.8 kg 76.3 kg    Examination:  General: A/O x4, No acute respiratory distress Eyes: negative scleral hemorrhage, negative anisocoria, negative icterus  ENT: Negative Runny nose, negative gingival bleeding, Neck:  Negative scars, masses, torticollis, lymphadenopathy, JVD Lungs: Clear to auscultation bilaterally without wheezes or crackles Cardiovascular: Regular rate and rhythm without murmur gallop or rub normal S1 and S2 Abdomen: negative abdominal pain, nondistended, positive soft, bowel sounds, no rebound, no ascites, no appreciable mass Extremities: No significant cyanosis, clubbing, or edema bilateral lower extremities Skin: Negative rashes, lesions, ulcers Psychiatric:  Negative depression, negative anxiety, negative fatigue, negative mania  Central nervous system:  Cranial nerves II through XII intact, tongue/uvula midline, all extremities muscle strength 5/5, sensation intact throughout, negative dysarthria, negative expressive aphasia, negative receptive aphasia.  .     Data Reviewed: Care during the described time interval was provided by me .  I have reviewed this patient's available data, including medical history, events of note, physical examination, and all test results as part of my evaluation.  CBC: Recent Labs  Lab 02/29/20 1531 02/29/20 1815 03/01/20 0305 03/02/20 0210 03/03/20 0129  WBC 13.1* 9.9 7.9 9.0 8.5  NEUTROABS 11.8*  --  6.9 7.0 5.5  HGB 17.3* 15.2* 15.2* 13.5 13.0  HCT 50.3* 44.0 43.1 37.2 35.6*  MCV 89.5 89.8 88.0 86.9 87.5  PLT 240 219 199 177 156   Basic Metabolic Panel: Recent Labs  Lab 02/29/20 1531 02/29/20 1815 02/29/20 2224 03/01/20 0305 03/01/20 1339 03/01/20 1820 03/02/20 0210 03/03/20 0129  NA 134*  --   --  136  --   --  134* 139  K 2.7*  --    < > 3.0* 3.5 3.9 4.0 3.3*  CL 94*  --   --  101  --   --  105 104  CO2 21*  --   --  18*  --   --  18* 23  GLUCOSE 140*  --   --  135*  --   --  148* 146*  BUN 50*  --   --  43*  --   --  36* 30*  CREATININE 2.63* 2.20*  --  1.73*  --   --  1.22* 1.07*  CALCIUM 9.3  --   --  8.6*  --   --  8.8* 8.8*  MG  --   --    < > 1.9 1.6* 2.0  2.0 1.8  PHOS  --   --   --  3.1  --   --  2.0* 2.7   < > = values in this interval not displayed.   GFR: Estimated Creatinine Clearance: 50.1 mL/min (A) (by C-G formula based on SCr of 1.07 mg/dL (H)). Liver Function Tests: Recent Labs  Lab 02/29/20 1531 03/01/20 0305 03/02/20 0210  03/03/20 0129  AST 47* 47* 85* 251*  ALT 30 29 44 142*  ALKPHOS 134* 128* 167* 212*  BILITOT 2.9* 2.7* 2.0* 1.3*  PROT 7.2 5.4* 5.5* 5.1*  ALBUMIN 2.4* 1.8* 1.9* 1.8*   No results for input(s): LIPASE, AMYLASE in the last 168 hours. No results for input(s): AMMONIA in the last 168 hours. Coagulation Profile: Recent Labs  Lab 02/29/20 1531 03/01/20 0305  INR 1.7* 1.8*   Cardiac Enzymes: No results for input(s): CKTOTAL, CKMB, CKMBINDEX, TROPONINI in the last 168 hours. BNP (last 3 results) No results for input(s): PROBNP in the last 8760 hours. HbA1C: No results for input(s): HGBA1C in the last 72 hours. CBG: No results for input(s): GLUCAP in the last 168 hours. Lipid Profile: Recent Labs    03/01/20 0305  CHOL 71  HDL <10*  LDLCALC NOT CALCULATED  TRIG 237*  CHOLHDL NOT CALCULATED   Thyroid Function Tests: No results for input(s): TSH, T4TOTAL, FREET4, T3FREE, THYROIDAB in the last 72 hours. Anemia Panel: No results for input(s): VITAMINB12, FOLATE, FERRITIN, TIBC, IRON, RETICCTPCT in the last 72 hours. Sepsis Labs: Recent Labs  Lab 02/29/20 1531 02/29/20 1815 02/29/20 2224 03/01/20 0648 03/02/20 0210  PROCALCITON  --  2.06  --  1.65 0.99  LATICACIDVEN 3.5* 2.3* 2.2*  --   --     Recent Results (from the past 240 hour(s))  Blood Culture (routine x 2)     Status: None (Preliminary result)   Collection Time: 02/29/20  3:31 PM   Specimen: BLOOD  Result Value Ref Range Status   Specimen Description BLOOD LEFT ANTECUBITAL  Final   Special Requests   Final    BOTTLES DRAWN AEROBIC AND ANAEROBIC Blood Culture results may not be optimal due to an inadequate volume of blood  received in culture bottles   Culture   Final    NO GROWTH 3 DAYS Performed at [facility], [address] [place], [place] [address]    Report Status PENDING  Incomplete  Blood Culture (routine x 2)     Status: None (Preliminary result)   Collection Time: 02/29/20  3:31 PM   Specimen: BLOOD  Result Value Ref Range Status   Specimen Description BLOOD RIGHT ANTECUBITAL  Final   Special Requests   Final    BOTTLES DRAWN AEROBIC AND ANAEROBIC Blood Culture results may not be optimal due to an inadequate volume of blood received in culture bottles   Culture   Final    NO GROWTH 3 DAYS Performed at [facility], [address] [place], [place] [address]    Report Status PENDING  Incomplete  Resp Panel by RT-PCR (Flu A&B, Covid) Nasopharyngeal Swab     Status: None   Collection Time: 02/29/20  3:51 PM   Specimen: Nasopharyngeal Swab; Nasopharyngeal(NP) swabs in vial transport medium  Result Value Ref Range Status   SARS Coronavirus 2 by RT PCR NEGATIVE NEGATIVE Final    Comment: (NOTE) SARS-CoV-2 target nucleic acids are NOT DETECTED.  The SARS-CoV-2 RNA is generally detectable in upper respiratory specimens during the acute phase of infection. The lowest concentration of SARS-CoV-2 viral copies this assay can detect is 138 copies/mL. A negative result does not preclude SARS-Cov-2 infection and should not be used as the sole basis for treatment or other patient management decisions. A negative result may occur with  improper specimen collection/handling, submission of specimen other than nasopharyngeal swab, presence of viral mutation(s) within the areas targeted by  this assay, and inadequate number of viral copies(<138 copies/mL). A negative result must be combined with clinical observations, patient history, and epidemiological information. The expected result is Negative.  Fact Sheet for Patients:  BloggerCourse.com  Fact Sheet for  Healthcare Providers:  SeriousBroker.it  This test is no t yet approved or cleared by the [place] FDA and  has been authorized for detection and/or diagnosis of SARS-CoV-2 by FDA under an Emergency Use Authorization (EUA). This EUA will remain  in effect (meaning this test can be used) for the duration of the COVID-19 declaration under Section 564(b)(1) of the Act, 21 U.S.C.section 360bbb-3(b)(1), unless the authorization is terminated  or revoked sooner.       Influenza A by PCR NEGATIVE NEGATIVE Final   Influenza B by PCR NEGATIVE NEGATIVE Final    Comment: (NOTE) The Xpert Xpress SARS-CoV-2/FLU/RSV plus assay is intended as an aid in the diagnosis of influenza from Nasopharyngeal swab specimens and should not be used as a sole basis for treatment. Nasal washings and aspirates are unacceptable for Xpert Xpress SARS-CoV-2/FLU/RSV testing.  Fact Sheet for Patients: BloggerCourse.com  Fact Sheet for Healthcare Providers: SeriousBroker.it  This test is not yet approved or cleared by the [place] FDA and has been authorized for detection and/or diagnosis of SARS-CoV-2 by FDA under an Emergency Use Authorization (EUA). This EUA will remain in effect (meaning this test can be used) for the duration of the COVID-19 declaration under Section 564(b)(1) of the Act, 21 U.S.C. section 360bbb-3(b)(1), unless the authorization is terminated or revoked.  Performed at [facility], [address], [place], [place] [address]   Urine culture     Status: Abnormal   Collection Time: 02/29/20  8:21 PM   Specimen: In/Out Cath Urine  Result Value Ref Range Status   Specimen Description IN/OUT CATH URINE  Final   Special Requests   Final    NONE Performed at [facility], [address] [place], [place] [address]    Culture MULTIPLE SPECIES PRESENT, SUGGEST RECOLLECTION (A)  Final   Report  Status 03/02/2020 FINAL  Final         Radiology Studies: No results found.      Scheduled Meds: . apixaban  5 mg Oral BID  . vitamin C  500 mg Oral Daily  . aspirin EC  81 mg Oral Daily  . atenolol  12.5 mg Oral BID  . azithromycin  500 mg Oral Daily  . cefdinir  300 mg Oral Q12H  . methylPREDNISolone (SOLU-MEDROL) injection  60 mg Intravenous Q24H  . multivitamin with minerals  1 tablet Oral Daily  . venlafaxine XR  75 mg Oral Q breakfast  . zinc sulfate  220 mg Oral Daily   Continuous Infusions: . amiodarone 30 mg/hr (03/03/20 0058)  . azithromycin       LOS: 3 days    Time spent:40 min    WOODS, Roselind Messier, MD [facility] Pager [phone]  If 7PM-7AM, please contact night-coverage [email or web address] Password [email or web address] 03/03/2020, 1:16 PM

## 2020-03-03 NOTE — Evaluation (Signed)
Physical Therapy Evaluation Patient Details Name: Kari Harrison MRN: [id number] DOB: [birth year] Today's Date: 03/03/2020   History of Present Illness  Pt is a 64 y/o female with PMHx including CAD, HTN, A-fib, pulmonary nodules, tobacco abuse, who presented to the ER from PCP with concerns for PNA. Pt found to have severe sepsis, PNA.  Clinical Impression  Pt admitted with above diagnosis. Pt was able to ambulate without device with min guard assist with guarded gait at times therefore encouraged pt to use RW initially on d/c. Pt agrees.  Pt also is possibly interested in going to Outpt PT f/u for balance training and will need prescription.  Will follow acutely.  Pt currently with functional limitations due to the deficits listed below (see PT Problem List). Pt will benefit from skilled PT to increase their independence and safety with mobility to allow discharge to the venue listed below.      Follow Up Recommendations Outpatient PT;Supervision - Intermittent (for balance training)    Equipment Recommendations  Other (comment) (pt to borrow a RW and shower seat)    Recommendations for Other Services       Precautions / Restrictions Precautions Precautions: Fall Restrictions Weight Bearing Restrictions: No      Mobility  Bed Mobility Overal bed mobility: Modified Independent                  Transfers Overall transfer level: Needs assistance Equipment used: None Transfers: Sit to/from Stand Sit to Stand: Min guard;Supervision         General transfer comment: minguard from EOB for initial balance and safety, supervision from toilet   Ambulation/Gait Ambulation/Gait assistance: Min guard Gait Distance (Feet): 250 Feet Assistive device: None Gait Pattern/deviations: Step-through pattern;Decreased stride length;Drifts right/left   Gait velocity interpretation: 1.31 - 2.62 ft/sec, indicative of limited community ambulator General Gait Details: Pt at times  drifts left and right needing steadying assist at times without device. Pt encouraged to use RW intiially at home.    Stairs Stairs: Yes Stairs assistance: Supervision Stair Management: One rail Right;Alternating pattern;Forwards Number of Stairs: 6 General stair comments: Pt able to ascend and descend steps without assist.   Wheelchair Mobility    Modified Rankin (Stroke Patients Only)       Balance Overall balance assessment: Needs assistance Sitting-balance support: Feet supported Sitting balance-Leahy Scale: Good     Standing balance support: No upper extremity supported;During functional activity Standing balance-Leahy Scale: Fair                               Pertinent Vitals/Pain Pain Assessment: No/denies pain    Home Living Family/patient expects to be discharged to:: Private residence Living Arrangements: Spouse/significant other Available Help at Discharge: Family;Available PRN/intermittently Type of Home: House Home Access: Stairs to enter Entrance Stairs-Rails: Right Entrance Stairs-Number of Steps: 5 Home Layout: One level Home Equipment: None Additional Comments: works as Civil engineer, 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[employer]    Prior Function Level of Independence: Independent               Hand Dominance   Dominant Hand: Right    Extremity/Trunk Assessment   Upper Extremity Assessment Upper Extremity Assessment: Defer to OT evaluation    Lower Extremity Assessment Lower Extremity Assessment: Generalized weakness    Cervical / Trunk Assessment Cervical / Trunk Assessment: Normal  Communication   Communication: No difficulties  Cognition Arousal/Alertness: Awake/alert Behavior During Therapy: WFL for tasks assessed/performed  Overall Cognitive Status: Within Functional Limits for tasks assessed                                        General Comments General comments (skin integrity, edema, etc.): 90 bpm, O2 90% and > on RA     Exercises Other Exercises Other Exercises: educated in RUE elevation, digit ROM for edema reduction (had infiltrated IV)   Assessment/Plan    PT Assessment Patient needs continued PT services  PT Problem List Decreased activity tolerance;Decreased balance;Decreased mobility;Decreased knowledge of use of DME;Decreased safety awareness;Decreased knowledge of precautions       PT Treatment Interventions Gait training;Stair training;DME instruction;Functional mobility training;Therapeutic activities;Therapeutic exercise;Balance training;Patient/family education    PT Goals (Current goals can be found in the Care Plan section)  Acute Rehab PT Goals Patient Stated Goal: home when able  PT Goal Formulation: With patient Time For Goal Achievement: 03/17/20 Potential to Achieve Goals: Good    Frequency Min 3X/week   Barriers to discharge        Co-evaluation               AM-PAC PT "6 Clicks" Mobility  Outcome Measure Help needed turning from your back to your side while in a flat bed without using bedrails?: None Help needed moving from lying on your back to sitting on the side of a flat bed without using bedrails?: None Help needed moving to and from a bed to a chair (including a wheelchair)?: A Little Help needed standing up from a chair using your arms (e.g., wheelchair or bedside chair)?: A Little Help needed to walk in hospital room?: A Little Help needed climbing 3-5 steps with a railing? : A Little 6 Click Score: 20    End of Session Equipment Utilized During Treatment: Gait belt Activity Tolerance: Patient limited by fatigue Patient left: in chair;with call bell/phone within reach;with chair alarm set;with nursing/sitter in room Nurse Communication: Mobility status PT Visit Diagnosis: Muscle weakness (generalized) (M62.81)    Time: 4431-5400 PT Time Calculation (min) (ACUTE ONLY): 11 min   Charges:   PT Evaluation $PT Eval Moderate Complexity: 1 Mod           Kari Harrison,PT Acute Rehabilitation Services Pager:  [phone]  Office:  [phone]    Kari Harrison 03/03/2020, 4:09 PM

## 2020-03-03 NOTE — Evaluation (Signed)
Occupational Therapy Evaluation Patient Details Name: Kari Harrison MRN: [id number] DOB: [birth year] Today's Date: 03/03/2020    History of Present Illness Pt is a 64 y/o female with PMHx including CAD, HTN, A-fib, pulmonary nodules, tobacco abuse, who presented to the ER from PCP with concerns for PNA. Pt found to have severe sepsis, PNA.   Clinical Impression   This 64 y/o female presents with the above. PTA pt reports being independent with ADL, iADL and functional mobility. Today pt performing mobility tasks without AD, initially with minA progressing to close minguard assist. Pt requiring up to minguard assist for ADL completion. SpO2 >90% on RA post activity. Pt to benefit from continued acute OT services to maximize her overall safety and independence with ADL and mobility prior to return home. Do not anticipate she will require follow up OT services after discharge.     Follow Up Recommendations  No OT follow up;Supervision - Intermittent    Equipment Recommendations  None recommended by OT (pt reports can borrow/get shower seat )           Precautions / Restrictions Precautions Precautions: Fall Restrictions Weight Bearing Restrictions: No      Mobility Bed Mobility Overal bed mobility: Modified Independent                  Transfers Overall transfer level: Needs assistance Equipment used: None Transfers: Sit to/from Stand Sit to Stand: Min guard;Supervision         General transfer comment: minguard from EOB for initial balance and safety, supervision from toilet     Balance Overall balance assessment: Needs assistance Sitting-balance support: Feet supported Sitting balance-Leahy Scale: Good     Standing balance support: No upper extremity supported;During functional activity Standing balance-Leahy Scale: Fair                             ADL either performed or assessed with clinical judgement   ADL Overall ADL's : Needs  assistance/impaired Eating/Feeding: Modified independent;Sitting   Grooming: Min guard;Standing;Wash/dry hands   Upper Body Bathing: Supervision/ safety;Sitting   Lower Body Bathing: Min guard;Sit to/from stand   Upper Body Dressing : Set up;Sitting   Lower Body Dressing: Min guard;Sit to/from stand Lower Body Dressing Details (indicate cue type and reason): donning socks via figure 4 at EOB  Toilet Transfer: Minimal assistance;Ambulation Toilet Transfer Details (indicate cue type and reason): minA to minguard assist, mild sway without AD but no overt LOB Toileting- Clothing Manipulation and Hygiene: Supervision/safety;Sitting/lateral lean;Sit to/from stand Toileting - Clothing Manipulation Details (indicate cue type and reason): pt performing clothing management and pericare without assist    Tub/Shower Transfer Details (indicate cue type and reason): discussed possible use of shower seat initially after return home for added safety, pt verbalized understanding, reports she can borrow/get shower stool should she feel it still necessary once she d/c's home  Functional mobility during ADLs: Min guard;Minimal assistance (minguard to minA)                           Pertinent Vitals/Pain Pain Assessment: No/denies pain     Hand Dominance Right   Extremity/Trunk Assessment Upper Extremity Assessment Upper Extremity Assessment: Overall WFL for tasks assessed   Lower Extremity Assessment Lower Extremity Assessment: Defer to PT evaluation       Communication Communication Communication: No difficulties   Cognition Arousal/Alertness: Awake/alert Behavior During Therapy: [facility] for  tasks assessed/performed Overall Cognitive Status: Within Functional Limits for tasks assessed                                     General Comments  SpO2 >90% on RA    Exercises Exercises: Other exercises Other Exercises Other Exercises: educated in RUE elevation, digit ROM for  edema reduction (had infiltrated IV)   Shoulder Instructions      Home Living Family/patient expects to be discharged to:: Private residence Living Arrangements: Spouse/significant other Available Help at Discharge: Family;Available PRN/intermittently Type of Home: House Home Access: Stairs to enter [employer] of Steps: 5 Entrance Stairs-Rails: Right Home Layout: One level     Bathroom Shower/Tub: Chief Strategy Officer: Standard     Home Equipment: None   Additional Comments: works as Civil engineer, 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[employer]      Prior Functioning/Environment Level of Independence: Independent                 OT Problem List: Decreased strength;Decreased activity tolerance;Impaired balance (sitting and/or standing);Cardiopulmonary status limiting activity;Decreased knowledge of use of DME or AE      OT Treatment/Interventions: Self-care/ADL training;Therapeutic exercise;Energy conservation;DME and/or AE instruction;Therapeutic activities;Patient/family education;Balance training    OT Goals(Current goals can be found in the care plan section) Acute Rehab OT Goals Patient Stated Goal: home when able  OT Goal Formulation: With patient Time For Goal Achievement: 03/17/20 Potential to Achieve Goals: Good  OT Frequency: Min 2X/week   Barriers to D/C:            Co-evaluation              AM-PAC OT "6 Clicks" Daily Activity     Outcome Measure Help from another person eating meals?: None Help from another person taking care of personal grooming?: A Little Help from another person toileting, which includes using toliet, bedpan, or urinal?: A Little Help from another person bathing (including washing, rinsing, drying)?: A Little Help from another person to put on and taking off regular upper body clothing?: None Help from another person to put on and taking off regular lower body clothing?: A Little 6 Click Score: 20   End of Session Equipment Utilized  During Treatment: Gait belt Nurse Communication: Mobility status  Activity Tolerance: Patient tolerated treatment well Patient left: in chair;with call bell/phone within reach;with nursing/sitter in room  OT Visit Diagnosis: Unsteadiness on feet (R26.81)                Time: 5784-6962 OT Time Calculation (min): 10 min Charges:  OT General Charges $OT Visit: 1 Visit OT Evaluation $OT Eval Moderate Complexity: 1 Mod  Marcy Siren, OT Acute Rehabilitation Services Pager [phone] Office [phone]   Kari Harrison 03/03/2020, 3:16 PM

## 2020-03-04 LAB — CBC WITH DIFFERENTIAL/PLATELET
Abs Immature Granulocytes: 2.58 10*3/uL — ABNORMAL HIGH (ref 0.00–0.07)
Basophils Absolute: 0 10*3/uL (ref 0.0–0.1)
Basophils Relative: 0 %
Eosinophils Absolute: 0 10*3/uL (ref 0.0–0.5)
Eosinophils Relative: 0 %
HCT: 39 % (ref 36.0–46.0)
Hemoglobin: 14 g/dL (ref 12.0–15.0)
Immature Granulocytes: 19 %
Lymphocytes Relative: 12 %
Lymphs Abs: 1.6 10*3/uL (ref 0.7–4.0)
MCH: 31.3 pg (ref 26.0–34.0)
MCHC: 35.9 g/dL (ref 30.0–36.0)
MCV: 87.2 fL (ref 80.0–100.0)
Monocytes Absolute: 0.7 10*3/uL (ref 0.1–1.0)
Monocytes Relative: 5 %
Neutro Abs: 8.4 10*3/uL — ABNORMAL HIGH (ref 1.7–7.7)
Neutrophils Relative %: 64 %
Platelets: 172 10*3/uL (ref 150–400)
RBC: 4.47 MIL/uL (ref 3.87–5.11)
RDW: 14.5 % (ref 11.5–15.5)
WBC: 13.3 10*3/uL — ABNORMAL HIGH (ref 4.0–10.5)
nRBC: 0 % (ref 0.0–0.2)

## 2020-03-04 LAB — MAGNESIUM: Magnesium: 1.7 mg/dL (ref 1.7–2.4)

## 2020-03-04 LAB — COMPREHENSIVE METABOLIC PANEL
ALT: 149 U/L — ABNORMAL HIGH (ref 0–44)
AST: 205 U/L — ABNORMAL HIGH (ref 15–41)
Albumin: 1.9 g/dL — ABNORMAL LOW (ref 3.5–5.0)
Alkaline Phosphatase: 199 U/L — ABNORMAL HIGH (ref 38–126)
Anion gap: 11 (ref 5–15)
BUN: 25 mg/dL — ABNORMAL HIGH (ref 8–23)
CO2: 22 mmol/L (ref 22–32)
Calcium: 8.3 mg/dL — ABNORMAL LOW (ref 8.9–10.3)
Chloride: 104 mmol/L (ref 98–111)
Creatinine, Ser: 0.92 mg/dL (ref 0.44–1.00)
GFR, Estimated: >60 mL/min (ref >60)
Glucose, Bld: 155 mg/dL — ABNORMAL HIGH (ref 70–99)
Potassium: 4.2 mmol/L (ref 3.5–5.1)
Sodium: 137 mmol/L (ref 135–145)
Total Bilirubin: 1.5 mg/dL — ABNORMAL HIGH (ref 0.3–1.2)
Total Protein: 5.5 g/dL — ABNORMAL LOW (ref 6.5–8.1)

## 2020-03-04 LAB — PHOSPHORUS: Phosphorus: 3.1 mg/dL (ref 2.5–4.6)

## 2020-03-04 MED ORDER — MAGNESIUM SULFATE 2 GM/50ML IV SOLN
2.0000 g | Freq: Once | INTRAVENOUS | Status: AC
  Administered 2020-03-04: 2 g via INTRAVENOUS
  Filled 2020-03-04: qty 50

## 2020-03-04 NOTE — Progress Notes (Signed)
SATURATION QUALIFICATIONS: (This note is used to comply with regulatory documentation for home oxygen)  Patient Saturations on Room Air at Rest = 97-98%  Patient Saturations on Room Air while Ambulating = 94-97%  Patient Saturations on 0 Liters of oxygen while Ambulating = 94-97%  Patient oxygen saturations remained above 94 % on RA while ambulating.

## 2020-03-04 NOTE — Progress Notes (Signed)
PROGRESS NOTE    Kari Harrison  KKX:[id number] DOB: 1956/03/23 DOA: 02/29/2020 PCP: Sigmund Hazel, MD     Brief Narrative:  64 y.o. WF PMHx CAD, HTN, A. fib on Eliquis, Pulmonary nodules, Tobacco abuse   Subjective: 12/5 afebrile overnight A/O x4, negative CP, negative S OB. Feels significantly improved.   Assessment & Plan: Covid vaccination; vaccinated   Active Problems:   Tobacco abuse   CAD in native artery   Hypertension   Pulmonary nodules   Paroxysmal atrial fibrillation (HCC)   Severe sepsis (HCC)   Pneumonia   Substance abuse (HCC)   Severe sepsis -On admission patient meets criteria for severe sepsis WBC> 12, HR> 90, RR> 20.  Lactic acid> 2.0 Site of infection lungs. -LR 134ml/hr -Continue current antibiotics -Trend procalcitonin/lactic acid; broaden antibiotics if required -Blood culture pending -Sputum culture pending -Titrate O2 to maintain SPO2> 92% -Xopenex QID -Solu-Medrol 60 mg daily -Flutter valve -Incentive spirometry -12/1 urine rapid drug screen positive marijuana -12/5 sepsis physiology resolving. -12/5 ambulatory SPO2 pending -12/5 does not meet criteria for home O2 SATURATION QUALIFICATIONS: (This note is used to comply with regulatory documentation for home oxygen) Patient Saturations on Room Air at Rest = 97-98% Patient Saturations on Room Air while Ambulating = 94-97% Patient Saturations on 0 Liters of oxygen while Ambulating = 94-97% Patient oxygen saturations remained above 94 % on RA while ambulating. -12/5 patient now on p.o. antibiotics if she tolerates discharge in the a.m. -Schedule follow-up in 2 to 3 weeks with Dr. Sigmund Hazel, PCP severe sepsis, pneumonia  Pneumonia -See severe sepsis  Tobacco abuse -Patient stated currently did not require nicotine patch  Pulmonary nodule -Per CT chest W contrast 09/22/2014 LUL/LLL nodules had completely resolved. -Once patient stable will further evaluate given her history of  smoking -Schedule follow-up with Dr. Delton Coombes PCCM in 6 to 8 weeks, obtain CT chest to ensure pneumonia has resolved and no pulmonary nodules hidden within opacifications  Paroxysmal atrial fibrillation -currently not rate controlled -12/4 DC Amiodarone drip -12/4 increase Atenolol 50 mg daily which is patient's home dose -Eliquis 5 mg BID  HTN -Currently not an issue.  Secondary to patient's severe sepsis  CAD -Lipid panel pending -ASA 81 mg daily  Hypokalemia -Potassium goal> 4   Hypomagnesmia -Magnesium goal> 2 -12/5 Magnesium IV 2 g  Hypophosphatemia -Goal phosphorus> 2.5  Depression -12/2 venlafaxine 75 mg daily  Substance abuse -12/1 urine tox screen positive for marijuana  Goals of care -12/4 PT/OT consult;Patient with severe sepsis evaluate for [facility] vs SNF.  Work with patient daily   DVT prophylaxis: Eliquis Code Status: Full Family Communication: 12/3 husband at bedside for discussion of plan of care answered all questions Status is: Inpatient    Dispo: The patient is from: Home              Anticipated d/c is to: Home              Anticipated d/c date is: 12/6              Patient currently unstable      Consultants:    Procedures/Significant Events:    I have personally reviewed and interpreted all radiology studies and my findings are as above.  VENTILATOR SETTINGS: Room air 12/4    Cultures   Antimicrobials: Anti-infectives (From admission, onward)   Start     Ordered Stop   03/03/20 1600  azithromycin (ZITHROMAX) tablet 500 mg  03/02/20 1117 03/05/20 1559   03/03/20 1000  cefdinir (OMNICEF) capsule 300 mg        03/02/20 1117 03/05/20 0959   03/02/20 1600  azithromycin (ZITHROMAX) 500 mg in sodium chloride 0.9 % 250 mL IVPB        03/02/20 1117 03/03/20 1559   02/29/20 1745  cefTRIAXone (ROCEPHIN) 2 g in sodium chloride 0.9 % 100 mL IVPB  Status:  Discontinued        02/29/20 1743 02/29/20 1745   02/29/20 1745   azithromycin (ZITHROMAX) 500 mg in sodium chloride 0.9 % 250 mL IVPB  Status:  Discontinued        02/29/20 1743 02/29/20 1745   02/29/20 1500  cefTRIAXone (ROCEPHIN) 2 g in sodium chloride 0.9 % 100 mL IVPB  Status:  Discontinued        02/29/20 1457 03/02/20 1117   02/29/20 1500  azithromycin (ZITHROMAX) 500 mg in sodium chloride 0.9 % 250 mL IVPB  Status:  Discontinued        02/29/20 1457 03/02/20 1117      Devices    LINES / TUBES:      Continuous Infusions:    Objective: Vitals:   03/03/20 2338 03/03/20 2339 03/04/20 0453 03/04/20 0810  BP: (!) 144/94 (!) 144/94 115/86 (!) 129/98  Pulse: 100 100 100 (!) 105  Resp: Temp: 98.2 F (36.8 C) 98.2 F (36.8 C) 98.3 F (36.8 C) 98.1 F (36.7 C)  TempSrc: Oral Oral Oral Oral  SpO2: 99% 99% 98% 93%  Weight:      Height:        Intake/Output Summary (Last 24 hours) at 03/04/2020 1020 Last data filed at 03/04/2020 1610 Gross per 24 hour  Intake 478.77 ml  Output --  Net 478.77 ml   Filed Weights   02/29/20 2055 03/01/20 1756 03/02/20 0436  Weight: 73.6 kg 75.8 kg 76.3 kg   Physical Exam:  General: A/O x4 No acute respiratory distress Eyes: negative scleral hemorrhage, negative anisocoria, negative icterus ENT: Negative Runny nose, negative gingival bleeding, Neck:  Negative scars, masses, torticollis, lymphadenopathy, JVD Lungs: Clear to auscultation bilaterally without wheezes or crackles Cardiovascular: Regular rate and rhythm without murmur gallop or rub normal S1 and S2 Abdomen: negative abdominal pain, nondistended, positive soft, bowel sounds, no rebound, no ascites, no appreciable mass Extremities: No significant cyanosis, clubbing, or edema bilateral lower extremities Skin: Negative rashes, lesions, ulcers Psychiatric:  Negative depression, negative anxiety, negative fatigue, negative mania  Central nervous system:  Cranial nerves II through XII intact, tongue/uvula midline, all extremities  muscle strength 5/5, sensation intact throughout, negative dysarthria, negative expressive aphasia, negative receptive aphasia.  .     Data Reviewed: Care during the described time interval was provided by me .  I have reviewed this patient's available data, including medical history, events of note, physical examination, and all test results as part of my evaluation.  CBC: Recent Labs  Lab 02/29/20 1531 02/29/20 1531 02/29/20 1815 03/01/20 0305 03/02/20 0210 03/03/20 0129 03/04/20 0212  WBC 13.1*   < > 9.9 7.9 9.0 8.5 13.3*  NEUTROABS 11.8*  --   --  6.9 7.0 5.5 8.4*  HGB 17.3*   < > 15.2* 15.2* 13.5 13.0 14.0  HCT 50.3*   < > 44.0 43.1 37.2 35.6* 39.0  MCV 89.5   < > 89.8 88.0 86.9 87.5 87.2  PLT 240   < > 219 199 177 156 172   < > =  values in this interval not displayed.   Basic Metabolic Panel: Recent Labs  Lab 02/29/20 1531 02/29/20 1531 02/29/20 1815 02/29/20 2224 03/01/20 0305 03/01/20 0305 03/01/20 1339 03/01/20 1820 03/02/20 0210 03/03/20 0129 03/04/20 0212  NA 134*  --   --   --  136  --   --   --  134* 139 137  K 2.7*  --   --    < > 3.0*   < > 3.5 3.9 4.0 3.3* 4.2  CL 94*  --   --   --  101  --   --   --  105 104 104  CO2 21*  --   --   --  18*  --   --   --  18* 23 22  GLUCOSE 140*  --   --   --  135*  --   --   --  148* 146* 155*  BUN 50*  --   --   --  43*  --   --   --  36* 30* 25*  CREATININE 2.63*   < > 2.20*  --  1.73*  --   --   --  1.22* 1.07* 0.92  CALCIUM 9.3  --   --   --  8.6*  --   --   --  8.8* 8.8* 8.3*  MG  --   --   --    < > 1.9   < > 1.6* 2.0 2.0 1.8 1.7  PHOS  --   --   --   --  3.1  --   --   --  2.0* 2.7 3.1   < > = values in this interval not displayed.   GFR: Estimated Creatinine Clearance: 58.3 mL/min (by C-G formula based on SCr of 0.92 mg/dL). Liver Function Tests: Recent Labs  Lab 02/29/20 1531 03/01/20 0305 03/02/20 0210 03/03/20 0129 03/04/20 0212  AST 47* 47* 85* 251* 205*  ALT 30 29 44 142* 149*  ALKPHOS 134*  128* 167* 212* 199*  BILITOT 2.9* 2.7* 2.0* 1.3* 1.5*  PROT 7.2 5.4* 5.5* 5.1* 5.5*  ALBUMIN 2.4* 1.8* 1.9* 1.8* 1.9*   No results for input(s): LIPASE, AMYLASE in the last 168 hours. No results for input(s): AMMONIA in the last 168 hours. Coagulation Profile: Recent Labs  Lab 02/29/20 1531 03/01/20 0305  INR 1.7* 1.8*   Cardiac Enzymes: No results for input(s): CKTOTAL, CKMB, CKMBINDEX, TROPONINI in the last 168 hours. BNP (last 3 results) No results for input(s): PROBNP in the last 8760 hours. HbA1C: No results for input(s): HGBA1C in the last 72 hours. CBG: No results for input(s): GLUCAP in the last 168 hours. Lipid Profile: No results for input(s): CHOL, HDL, LDLCALC, TRIG, CHOLHDL, LDLDIRECT in the last 72 hours. Thyroid Function Tests: No results for input(s): TSH, T4TOTAL, FREET4, T3FREE, THYROIDAB in the last 72 hours. Anemia Panel: No results for input(s): VITAMINB12, FOLATE, FERRITIN, TIBC, IRON, RETICCTPCT in the last 72 hours. Sepsis Labs: Recent Labs  Lab 02/29/20 1531 02/29/20 1815 02/29/20 2224 03/01/20 0648 03/02/20 0210  PROCALCITON  --  2.06  --  1.65 0.99  LATICACIDVEN 3.5* 2.3* 2.2*  --   --     Recent Results (from the past 240 hour(s))  Blood Culture (routine x 2)     Status: None (Preliminary result)   Collection Time: 02/29/20  3:31 PM   Specimen: BLOOD  Result Value Ref Range Status   Specimen Description BLOOD  LEFT ANTECUBITAL  Final   Special Requests   Final    BOTTLES DRAWN AEROBIC AND ANAEROBIC Blood Culture results may not be optimal due to an inadequate volume of blood received in culture bottles   Culture   Final    NO GROWTH 4 DAYS Performed at [facility], [address] [place], [place] [address]    Report Status PENDING  Incomplete  Blood Culture (routine x 2)     Status: None (Preliminary result)   Collection Time: 02/29/20  3:31 PM   Specimen: BLOOD  Result Value Ref Range Status   Specimen Description BLOOD  RIGHT ANTECUBITAL  Final   Special Requests   Final    BOTTLES DRAWN AEROBIC AND ANAEROBIC Blood Culture results may not be optimal due to an inadequate volume of blood received in culture bottles   Culture   Final    NO GROWTH 4 DAYS Performed at [facility], [address] [place], [place] [address]    Report Status PENDING  Incomplete  Resp Panel by RT-PCR (Flu A&B, Covid) Nasopharyngeal Swab     Status: None   Collection Time: 02/29/20  3:51 PM   Specimen: Nasopharyngeal Swab; Nasopharyngeal(NP) swabs in vial transport medium  Result Value Ref Range Status   SARS Coronavirus 2 by RT PCR NEGATIVE NEGATIVE Final    Comment: (NOTE) SARS-CoV-2 target nucleic acids are NOT DETECTED.  The SARS-CoV-2 RNA is generally detectable in upper respiratory specimens during the acute phase of infection. The lowest concentration of SARS-CoV-2 viral copies this assay can detect is 138 copies/mL. A negative result does not preclude SARS-Cov-2 infection and should not be used as the sole basis for treatment or other patient management decisions. A negative result may occur with  improper specimen collection/handling, submission of specimen other than nasopharyngeal swab, presence of viral mutation(s) within the areas targeted by this assay, and inadequate number of viral copies(<138 copies/mL). A negative result must be combined with clinical observations, patient history, and epidemiological information. The expected result is Negative.  Fact Sheet for Patients:  BloggerCourse.comhttps://www.fda.gov/media/152166/download  Fact Sheet for Healthcare Providers:  SeriousBroker.ithttps://www.fda.gov/media/152162/download  This test is no t yet approved or cleared by the [place] FDA and  has been authorized for detection and/or diagnosis of SARS-CoV-2 by FDA under an Emergency Use Authorization (EUA). This EUA will remain  in effect (meaning this test can be used) for the duration of the COVID-19 declaration under  Section 564(b)(1) of the Act, 21 U.S.C.section 360bbb-3(b)(1), unless the authorization is terminated  or revoked sooner.       Influenza A by PCR NEGATIVE NEGATIVE Final   Influenza B by PCR NEGATIVE NEGATIVE Final    Comment: (NOTE) The Xpert Xpress SARS-CoV-2/FLU/RSV plus assay is intended as an aid in the diagnosis of influenza from Nasopharyngeal swab specimens and should not be used as a sole basis for treatment. Nasal washings and aspirates are unacceptable for Xpert Xpress SARS-CoV-2/FLU/RSV testing.  Fact Sheet for Patients: BloggerCourse.comhttps://www.fda.gov/media/152166/download  Fact Sheet for Healthcare Providers: SeriousBroker.ithttps://www.fda.gov/media/152162/download  This test is not yet approved or cleared by the [place] FDA and has been authorized for detection and/or diagnosis of SARS-CoV-2 by FDA under an Emergency Use Authorization (EUA). This EUA will remain in effect (meaning this test can be used) for the duration of the COVID-19 declaration under Section 564(b)(1) of the Act, 21 U.S.C. section 360bbb-3(b)(1), unless the authorization is terminated or revoked.  Performed at [facility], [address] [place], [place] [address]   Urine culture     Status: Abnormal   Collection Time: 02/29/20  8:21 PM   Specimen: In/Out Cath Urine  Result Value Ref Range Status   Specimen Description IN/OUT CATH URINE  Final   Special Requests   Final    NONE Performed at [facility], [address] [place], [place] [address]    Culture MULTIPLE SPECIES PRESENT, SUGGEST RECOLLECTION (A)  Final   Report Status 03/02/2020 FINAL  Final         Radiology Studies: No results found.      Scheduled Meds: . apixaban  5 mg Oral BID  . vitamin C  500 mg Oral Daily  . aspirin EC  81 mg Oral Daily  . atenolol  50 mg Oral Daily  . azithromycin  500 mg Oral Daily  . cefdinir  300 mg Oral Q12H  . methylPREDNISolone (SOLU-MEDROL) injection  60 mg Intravenous Q24H  .  multivitamin with minerals  1 tablet Oral Daily  . venlafaxine XR  75 mg Oral Q breakfast  . zinc sulfate  220 mg Oral Daily   Continuous Infusions:    LOS: 4 days    Time spent:40 min    Lawsen Arnott, Roselind Messier, MD [facility] Pager [phone]  If 7PM-7AM, please contact night-coverage [email or web address] Password [email or web address] 03/04/2020, 10:20 AM

## 2020-03-05 LAB — CBC WITH DIFFERENTIAL/PLATELET
Abs Immature Granulocytes: 0 10*3/uL (ref 0.00–0.07)
Basophils Absolute: 0 10*3/uL (ref 0.0–0.1)
Basophils Relative: 0 %
Eosinophils Absolute: 0 10*3/uL (ref 0.0–0.5)
Eosinophils Relative: 0 %
HCT: 41.7 % (ref 36.0–46.0)
Hemoglobin: 15.2 g/dL — ABNORMAL HIGH (ref 12.0–15.0)
Lymphocytes Relative: 10 %
Lymphs Abs: 1.7 10*3/uL (ref 0.7–4.0)
MCH: 32 pg (ref 26.0–34.0)
MCHC: 36.5 g/dL — ABNORMAL HIGH (ref 30.0–36.0)
MCV: 87.8 fL (ref 80.0–100.0)
Monocytes Absolute: 0.9 10*3/uL (ref 0.1–1.0)
Monocytes Relative: 5 %
Neutro Abs: 14.5 10*3/uL — ABNORMAL HIGH (ref 1.7–7.7)
Neutrophils Relative %: 85 %
Platelets: 202 10*3/uL (ref 150–400)
RBC: 4.75 MIL/uL (ref 3.87–5.11)
RDW: 14.3 % (ref 11.5–15.5)
WBC: 17.1 10*3/uL — ABNORMAL HIGH (ref 4.0–10.5)
nRBC: 0 % (ref 0.0–0.2)
nRBC: 0 /100 WBC

## 2020-03-05 LAB — COMPREHENSIVE METABOLIC PANEL
ALT: 135 U/L — ABNORMAL HIGH (ref 0–44)
AST: 141 U/L — ABNORMAL HIGH (ref 15–41)
Albumin: 2.1 g/dL — ABNORMAL LOW (ref 3.5–5.0)
Alkaline Phosphatase: 180 U/L — ABNORMAL HIGH (ref 38–126)
Anion gap: 10 (ref 5–15)
BUN: 24 mg/dL — ABNORMAL HIGH (ref 8–23)
CO2: 25 mmol/L (ref 22–32)
Calcium: 8.5 mg/dL — ABNORMAL LOW (ref 8.9–10.3)
Chloride: 101 mmol/L (ref 98–111)
Creatinine, Ser: 0.92 mg/dL (ref 0.44–1.00)
GFR, Estimated: >60 mL/min (ref >60)
Glucose, Bld: 163 mg/dL — ABNORMAL HIGH (ref 70–99)
Potassium: 4.2 mmol/L (ref 3.5–5.1)
Sodium: 136 mmol/L (ref 135–145)
Total Bilirubin: 1.6 mg/dL — ABNORMAL HIGH (ref 0.3–1.2)
Total Protein: 5.8 g/dL — ABNORMAL LOW (ref 6.5–8.1)

## 2020-03-05 LAB — CULTURE, BLOOD (ROUTINE X 2)
Culture: NO GROWTH
Culture: NO GROWTH

## 2020-03-05 LAB — MAGNESIUM: Magnesium: 1.6 mg/dL — ABNORMAL LOW (ref 1.7–2.4)

## 2020-03-05 LAB — PHOSPHORUS: Phosphorus: 3.3 mg/dL (ref 2.5–4.6)

## 2020-03-05 MED ORDER — PREDNISONE 20 MG PO TABS
40.0000 mg | ORAL_TABLET | Freq: Every day | ORAL | Status: DC
  Administered 2020-03-05: 40 mg via ORAL
  Filled 2020-03-05: qty 2

## 2020-03-05 NOTE — Progress Notes (Signed)
Occupational Therapy Treatment Patient Details Name: Kari Harrison MRN: [id number] DOB: 1955/08/03 Today's Date: 03/05/2020    History of present illness Pt is a 63 y/o female with PMHx including CAD, HTN, A-fib, pulmonary nodules, tobacco abuse, who presented to the ER from PCP with concerns for PNA. Pt found to have severe sepsis, PNA.   OT comments  Pt progressing towards established OT goals. Pt performing functional mobility in hallway with Supervision simulating home distance mobility. Pt HR elevating to 130s and tachy during mobility. Upon return to room, HR elevating to max 166. Pt returned to supine in bed. Pt asymptomatic and denies dizziness, fatigue, or chest pain. Notified RN. Continue to recommend dc to home and will continue to follow acutely as admitted.     Follow Up Recommendations  No OT follow up;Supervision - Intermittent    Equipment Recommendations  None recommended by OT (pt reports can borrow/get shower seat )    Recommendations for Other Services      Precautions / Restrictions Precautions Precautions: Fall Restrictions Weight Bearing Restrictions: No       Mobility Bed Mobility Overal bed mobility: Modified Independent                Transfers Overall transfer level: Needs assistance Equipment used: None Transfers: Sit to/from Stand Sit to Stand: Supervision         General transfer comment: Supervision for safety    Balance Overall balance assessment: Needs assistance Sitting-balance support: Feet supported Sitting balance-Leahy Scale: Good     Standing balance support: No upper extremity supported;During functional activity Standing balance-Leahy Scale: Fair                             ADL either performed or assessed with clinical judgement   ADL Overall ADL's : Needs assistance/impaired                         Toilet Transfer: Supervision/safety;Ambulation (simulated in room) Toilet Transfer  Details (indicate cue type and reason): Supervision for safety         Functional mobility during ADLs: Supervision/safety General ADL Comments: Pt performing functional mobility in hallway at Supervision level. HR elevating to 130s and reading tachy; upon return to room, HR elevating to 150 and then Max 166. Having pt return to supine in bed and HR tachy between 90-130s. Notified RN     Vision       Perception     Praxis      Cognition Arousal/Alertness: Awake/alert Behavior During Therapy: WFL for tasks assessed/performed Overall Cognitive Status: Within Functional Limits for tasks assessed                                 General Comments: Very sweet and agreeable to therapy        Exercises Exercises: Other exercises   Shoulder Instructions       General Comments HR at rest, 90-110. HR elevating to Max 166 during mobility. Pt denies any blurry vision, HA, chest pain, or dizziness with activity    Pertinent Vitals/ Pain       Pain Assessment: No/denies pain  Home Living  Prior Functioning/Environment              Frequency  Min 2X/week        Progress Toward Goals  OT Goals(current goals can now be found in the care plan section)  Progress towards OT goals: Progressing toward goals  Acute Rehab OT Goals Patient Stated Goal: home when able  OT Goal Formulation: With patient Time For Goal Achievement: 03/17/20 Potential to Achieve Goals: Good ADL Goals Pt Will Perform Grooming: with modified independence;standing Pt Will Perform Lower Body Bathing: with modified independence;sit to/from stand Pt Will Perform Lower Body Dressing: with modified independence;sit to/from stand Pt Will Transfer to Toilet: with modified independence;ambulating Pt Will Perform Toileting - Clothing Manipulation and hygiene: with modified independence;sit to/from stand  Plan Discharge plan remains  appropriate    Co-evaluation                 AM-PAC OT "6 Clicks" Daily Activity     Outcome Measure   Help from another person eating meals?: None Help from another person taking care of personal grooming?: A Little Help from another person toileting, which includes using toliet, bedpan, or urinal?: A Little Help from another person bathing (including washing, rinsing, drying)?: A Little Help from another person to put on and taking off regular upper body clothing?: None Help from another person to put on and taking off regular lower body clothing?: A Little 6 Click Score: 20    End of Session Equipment Utilized During Treatment: Gait belt  OT Visit Diagnosis: Unsteadiness on feet (R26.81)   Activity Tolerance Patient tolerated treatment well   Patient Left in bed;with call bell/phone within reach   Nurse Communication Mobility status        Time: 4709-6283 OT Time Calculation (min): 17 min  Charges: OT General Charges $OT Visit: 1 Visit OT Treatments $Therapeutic Activity: 8-22 mins  Kari Harrison MSOT, OTR/L Acute Rehab Pager: [phone] Office: [phone]   Kari Harrison 03/05/2020, 9:21 AM

## 2020-03-05 NOTE — Progress Notes (Addendum)
CARDIOLOGY OFFICE NOTE  Date:  03/19/2020    Kari Harrison Date of Birth: Nov 27, 1955 Medical Record #[id number]  PCP:  Sigmund HazelMiller, Lisa, MD  Cardiologist:  Tyrone SageGerhardt   Chief Complaint  Patient presents with  . Follow-up    History of Present Illness: Kari Harrison is a 64 y.o. female who presents today for a follow up visit. Former patient of Dr. Alford HighlandMcLean's.    She has a history of known CAD, HTN, tobacco abuse, lung nodules and HLD.  She has had prior cardiac CT showing severe 3 VD - with subsequent PCI of the LCX in 2016.   Presented to the hospital late last December 2020 with palpitations - found to have AF - she converted - no cardioversion. She had follow up in the AF clinic and was also to follow back up here. She is on anticoagulation now. Stress testing was done in January and was low risk. She was weaning off [facility]. Was to have sleep study. Smoking less.    I last saw her in June - she was doing well - trying to get her CPAP. Lots of insurance issues. Still smoking some.   She was admitted earlier this month with pneumonia - she was to have a follow up CT in 3 to 6 months along with repeat CXR - however there was NO CT this last admission. LFTs slightly. Up. She had AF and received IV amiodarone for a short course.   Comes in today. Here alone. She says she is ok. Getting strength back slowly - she thought she would feel better sooner. She saw PCP last week - had good report. She is to have CXR next month per PCP. No cough. Some DOE still. Not sleeping well for the past few nights - having some "flutters". Goes away within an hour or so.  BP is high. Her ACEHCTZ was stopped during this admission - BP was low then. She is having some "silver flashes" in her vision. Described like a "firework". This started after discharge. Her mom had MD. She is worried she is going blind. She has an eye doctor. She is not smoking. Her weight is down.   Past Medical History:   Diagnosis Date  . CAD (coronary artery disease)    a. Cardiac CTA: severe 3VD;  b. 05/2014 Cath/PCI: LM nl, LAD 50-60p/m, 6793m, D1 50ost/m, RI small/plaque, LCX dominant 20-30p, 7726m, 60/90d (2.25x24 Promus DES), OM2 40-2226m, RCA nondom, mod diff plaque, EF 55%.  Marland Kitchen. Hyperlipidemia   . Hypertension   . Pneumonia 03/01/2020  . Pulmonary nodules    a. 05/2014 CTA: left lung pulm nodules w/ rec for 3 month f/u noncontrast CT.  . Tobacco abuse     Past Surgical History:  Procedure Laterality Date  . CESAREAN SECTION    . LEFT HEART CATHETERIZATION WITH CORONARY ANGIOGRAM N/A 06/16/2014   Procedure: LEFT HEART CATHETERIZATION WITH CORONARY ANGIOGRAM;  Surgeon: Dolores Pattyaniel R Bensimhon, MD;  [facility];  Service: Cardiovascular;  Laterality: N/A;     Medications: Current Meds  Medication Sig  . Ascorbic Acid (VITAMIN C) 1000 MG tablet Take 1,000 mg by mouth daily.  Marland Kitchen. atenolol (TENORMIN) 50 MG tablet Take 50 mg by mouth daily.  Marland Kitchen. atorvastatin (LIPITOR) 80 MG tablet take 1 tablet by mouth once daily ; MAKE DOCTOR'S APPOINTMENT !  . Cholecalciferol (VITAMIN D) 50 MCG (2000 UT) tablet Take 2,000 Units by mouth daily.  Marland Kitchen. ibuprofen (ADVIL) 200 MG  tablet Take 400 mg by mouth every 6 (six) hours as needed for fever, headache or mild pain.  Marland Kitchen loratadine (CLARITIN) 10 MG tablet Take 10 mg by mouth daily.  . Multiple Minerals (CALCIUM/MAGNESIUM/ZINC PO) Take 1 tablet by mouth daily.  Marland Kitchen venlafaxine XR (EFFEXOR-XR) 75 MG 24 hr capsule Take 75 mg by mouth daily.     Allergies: No Known Allergies  Social History: The patient  reports that she has been smoking cigarettes. She has a 0.48 pack-year smoking history. She has never used smokeless tobacco. She reports previous drug use. Drug: Marijuana. She reports that she does not drink alcohol.   Family History: The patient's family history includes Arthritis in her mother; Heart attack in her father; Hypertension in her sister; Macular degeneration in  her mother.   Review of Systems: Please see the history of present illness.   All other systems are reviewed and negative.   Physical Exam: VS:  BP (!) 140/100   Pulse (!) 58   Ht 5\' 2"  (1.575 m)   Wt 156 lb 9.6 oz (71 kg)   SpO2 97%   BMI 28.64 kg/m  .  BMI Body mass index is 28.64 kg/m.  Wt Readings from Last 3 Encounters:  03/19/20 156 lb 9.6 oz (71 kg)  03/02/20 168 lb 3.2 oz (76.3 kg)  09/12/19 168 lb 6.4 oz (76.4 kg)    General: Alert and in no acute distress.  Her weight is down.  HEENT: Normal. Her smile is symmetric.  Neck: No bruits that I note. Cardiac: Regular rate and rhythm. No murmurs, rubs, or gallops. No edema.  Respiratory:  Lungs are clear but she has crackles in the left base that do not clear with coughing - she has normal work of breathing.  GI: Soft and nontender.  MS: No deformity or atrophy. Gait and ROM intact.  Skin: Warm and dry. Color is normal.  Neuro:  Strength and sensation are intact and no gross focal deficits noted.  Psych: Alert, appropriate and with normal affect.   LABORATORY DATA:  EKG:  EKG is not ordered today.    Lab Results  Component Value Date   WBC 17.1 (H) 03/05/2020   HGB 15.2 (H) 03/05/2020   HCT 41.7 03/05/2020   PLT 202 03/05/2020   GLUCOSE 163 (H) 03/05/2020   CHOL 71 03/01/2020   TRIG 237 (H) 03/01/2020   HDL <10 (L) 03/01/2020   LDLCALC NOT CALCULATED 03/01/2020   ALT 135 (H) 03/05/2020   AST 141 (H) 03/05/2020   NA 136 03/05/2020   K 4.2 03/05/2020   CL 101 03/05/2020   CREATININE 0.92 03/05/2020   BUN 24 (H) 03/05/2020   CO2 25 03/05/2020   TSH 4.492 02/20/2019   INR 1.8 (H) 03/01/2020   HGBA1C 6.1 (H) 02/20/2019       BNP (last 3 results) No results for input(s): BNP in the last 8760 hours.  ProBNP (last 3 results) No results for input(s): PROBNP in the last 8760 hours.   Other Studies Reviewed Today:  Myoview Study Highlights 04/2019    Nuclear stress EF: 61%.  EKG nondiagnostic due  to baseline changes  Normal perfusion. No ischemia or scar  This is a low risk study.        Echocardiogram 02/20/2019: 1. Left ventricular ejection fraction, by visual estimation, is 60 to 65%. The left ventricle has normal function. There is mildly increased left ventricular hypertrophy.  2. Left ventricular diastolic parameters are indeterminate.  3. Global right ventricle has normal systolic function.The right ventricular size is normal. No increase in right ventricular wall thickness.  4. Left atrial size was normal.  5. Right atrial size was normal.  6. Moderate aortic valve annular calcification.  7. The mitral valve is normal in structure. No evidence of mitral valve regurgitation. No evidence of mitral stenosis.  8. The tricuspid valve is normal in structure. Tricuspid valve regurgitation is not demonstrated.  9. The aortic valve was not well visualized. Aortic valve regurgitation is not visualized. No evidence of aortic valve sclerosis or stenosis. 10. There is Moderate calcification of the aortic valve. 11. There is Moderate thickening of the aortic valve. 12. The pulmonic valve was not well visualized. Pulmonic valve regurgitation is not visualized. 13. The inferior vena cava is normal in size with greater than 50% respiratory variability, suggesting right atrial pressure of 3 mmHg.     PROCEDURE 2016:  Cath/PCI distal left circumflex. Left main: Mild calcification. No significant stenosis.    LAD: Long vessel wraps apex. 50-60% lesion in proximal to mid LAD prior to take-off of first diagonal. There is a 40% lesion in the mid LAD just after the D1 take-off. Mild plaque in distal LAD. In large D1 there is 50% lesion ostially and 50% lesion in midsection.   LCX:  Small ramus with moderate ostial plaque. Large dominant circumflex. Gives off large branching OM-1. 3 PLS and a PDA. In the proximal LCX there is a 20-30% stenosis. In the mid LCX there is a 50% lesion just after the  take-off of the OM-1.  In the distal LCX there is a 60% lesion after the first PL and a 90% lesion prior to the 2nd PL. In the large OM-1 there is a 40-50% mid lesion    RCA: Moderate sized non-dominant vessel with moderate diffuse plaque.   LV-gram done in the RAO projection: Ejection fraction =  55% nor regional wall motion abnormalities     IMPRESSIONS:    1. Successful PCI of the distal  left circumflex artery  with a 2.25 x 24 Promus drug-eluting stent, postdilated to greater than 2.5 mm in diameter. 2.   Unsuccessful attempt at diagnostic cardiac cath from the right radial approach due to radial loop.   RECOMMENDATION:  Continue dual antiplatelet therapy for at least a year. Continue aggressive secondary prevention.        Assessment/Plan:  1. Visual disturbance - she is going to contact her eye doctor - will get a carotid doppler done as well.   2. PAF - on anticoagulation - in sinus today.   3. Recent sepsis/pneumonia/UTI - still with abnormal lung exam - for CXR in a few weeks.   4. HTN - BP not at goal - restarting her Lisinopril HCTZ as she was taking before. Recheck lab on return.   5. Tobacco abuse- not smoking.   6. Known CAD with prior PCI/DES to the LCX - low risk Myoview noted from 04/2019.   7. HLD - on statin  Current medicines are reviewed with the patient today.  The patient does not have concerns regarding medicines other than what has been noted above.  The following changes have been made:  See above.  Labs/ tests ordered today include:    Orders Placed This Encounter  Procedures  . VAS US CAROTID     Disposition:   FU with me in one month. Then will establish with Dr. Shari Prows. Aasha is aware that I am  leaving in February. Will restart her Lisinopril HCTZ and check carotids. She is to get in touch with eye doctor for an exam.    Patient is agreeable to this plan and will call if any problems develop in the interim.   SignedNorma Fredrickson,  NP  03/19/2020 8:49 AM  [facility] [address] [place], [place]  [address] Phone: [phone] Fax: [phone]       Addendum:  Report from her eye exam - Hollenhorst plaque noted along with hypertensive changes - will need better BP control. Doppler study of carotids was stable.   Rosalio Macadamia, RN, ANP-C [facility] [address] [place], [place]  [address] [phone]

## 2020-03-05 NOTE — Care Management Important Message (Signed)
Important Message  Patient Details  Name: Kari Harrison MRN: [id number] Date of Birth: March 13, 1956   Medicare Important Message Given:  Yes     Renie Ora 03/05/2020, 10:12 AM

## 2020-03-05 NOTE — Discharge Summary (Signed)
Physician Discharge Summary  Kari Harrison NKN:[id number] DOB: [birth year] DOA: 02/29/2020  PCP: Kari Hazel, MD  Admit date: 02/29/2020 Discharge date: 03/05/2020  Time spent: 40 minutes  Recommendations for Outpatient Follow-up:  1. Needs Chem-12, magnesium and may be reinitiation of lisinopril/HCTZ in 1 to 2 weeks 2. Needs low-dose screening CT chest in 3 to 6 months to ensure lung nodule that was present on CT earlier this admission is stable 3. Consider outpatient chest x-ray in 1 month 4. Please work-up and get LFTs in the outpatient setting in 1 week given slight elevation of the LFTs probably from amiodarone IV  Discharge Diagnoses:  Active Problems:   Tobacco abuse   CAD in native artery   Hypertension   Pulmonary nodules   Paroxysmal atrial fibrillation (HCC)   Severe sepsis (HCC)   Pneumonia   Substance abuse [facility])   Discharge Condition: Improved  Diet recommendation: Heart healthy  Filed Weights   02/29/20 2055 03/01/20 1756 03/02/20 0436  Weight: 73.6 kg 75.8 kg 76.3 kg    History of present illness:  64 year old white female known A. fib CHADS2 score >4 on Eliquis, pulmonary nodules, CAD, HTN admitted from PCP office for concern for pneumonia-felt poorly several days prior to admission Covid test was negative had high spiking fevers to 103 at physician office found to have some mild hypotension in addition  Hospital Course:  Severe pneumonia sepsis Patient was treated with broad-spectrum antibiotics tapered gnarly to azithromycin and cefdinir-blood culture was negative urine culture showed multiple species Sepsis physiology resolved patient stabilized rapidly and completed 5 days of antibiotics in hospital therefore not requiring further  Leukocytosis Likely secondary to IV Solu-Medrol no fevers no chills Repeat x-ray in about 2 to 3 weeks outpatient setting  A. fib CHADS2 score >4 on Eliquis Continue atenolol home dose-during admission was on IV  amiodarone but was well controlled and this was discontinued prior to discharge  AKI on admission  secondary to combination of sepsis and use of lisinopril HCTZ Discontinued lisinopril HCTZ can resume as an outpatient  Hypokalemia on admission-corrected and stabilized  Mild transaminitis Potentially from the use of amiodarone this hospital stay-repeat trends in the outpatient setting  Discharge Exam: Vitals:   03/05/20 0400 03/05/20 0741  BP: (!) 126/95 136/89  Pulse: 100 100  Resp: 20 18  Temp: 98.2 F (36.8 C) 98.3 F (36.8 C)  SpO2: 95% 98%    General: Awake coherent no distress EOMI NCAT breathing comfortably not on oxygen Cardiovascular: S1-S2 A. fib RRR at baseline however Respiratory: Clinically clear no rales no rhonchi Abdomen soft no rebound no guarding No lower extremity edema ROM intact  Discharge Instructions   Discharge Instructions    Diet - low sodium heart healthy   Complete by: As directed    Discharge instructions   Complete by: As directed    You were treated this hospital stay for a severe pneumonia which resolved quickly thankfully You do not require any antibiotics currently as he completed 5 days in the hospital which is standard treatment and you look fairly well As discussed with you some of your labs are little bit elevated like your white count which could have been from steroids-this is a nonspecific sign of infection so my suggestion is that you get an outpatient follow-up with an x-ray in about 1 month I would recommend because of your smoking history and the fact that there was a small nodule seen on your CT that you get a CT  scan of your chest in about 6 months Please continue to quit smoking You will need labs at your primary physician's office in about 2 to 3 weeks Resume all your other medications other than your lisinopril HCTZ-this was stopped because we felt we may need to control your heart rate however can be resumed after getting  labs at your physician's office   Increase activity slowly   Complete by: As directed      Allergies as of 03/05/2020   No Known Allergies     Medication List    STOP taking these medications   lisinopril-hydrochlorothiazide 20-12.5 MG tablet Commonly known as: ZESTORETIC     TAKE these medications   apixaban 5 MG Tabs tablet Commonly known as: ELIQUIS Take 1 tablet (5 mg total) by mouth 2 (two) times daily.   atenolol 50 MG tablet Commonly known as: TENORMIN Take 50 mg by mouth daily.   atorvastatin 80 MG tablet Commonly known as: LIPITOR take 1 tablet by mouth once daily ; MAKE DOCTOR'S APPOINTMENT ! What changed: See the new instructions.   CALCIUM/MAGNESIUM/ZINC PO Take 1 tablet by mouth daily.   ibuprofen 200 MG tablet Commonly known as: ADVIL Take 400 mg by mouth every 6 (six) hours as needed for fever, headache or mild pain.   loratadine 10 MG tablet Commonly known as: CLARITIN Take 10 mg by mouth daily.   venlafaxine XR 75 MG 24 hr capsule Commonly known as: EFFEXOR-XR Take 75 mg by mouth daily.   vitamin C 1000 MG tablet Take 1,000 mg by mouth daily.   Vitamin D 50 MCG (2000 UT) tablet Take 2,000 Units by mouth daily.      No Known Allergies    The results of significant diagnostics from this hospitalization (including imaging, microbiology, ancillary and laboratory) are listed below for reference.    Significant Diagnostic Studies: DG Chest Port 1 View  Result Date: 02/29/2020 CLINICAL DATA:  Sepsis, cough, fever, previous abnormal chest x-ray EXAM: PORTABLE CHEST 1 VIEW COMPARISON:  02/29/2020 at 1:14 p.m. FINDINGS: Single frontal view of the chest was performed. The image is mislabeled by the technologist, as numerous prior exams demonstrate the cardiac apex points toward the left. The cardiac silhouette is unremarkable. There is dense left lower lobe consolidation consistent with pneumonia. Small left effusion cannot be excluded. No  pneumothorax. Right chest is clear. IMPRESSION: 1. Dense left lower lobe consolidation most compatible with pneumonia. Followup PA and lateral chest X-ray is recommended in 3-4 weeks following trial of antibiotic therapy to ensure resolution and exclude underlying malignancy. Electronically Signed   By: Sharlet Salina M.D.   On: 02/29/2020 15:36    Microbiology: Recent Results (from the past 240 hour(s))  Blood Culture (routine x 2)     Status: None   Collection Time: 02/29/20  3:31 PM   Specimen: BLOOD  Result Value Ref Range Status   Specimen Description BLOOD LEFT ANTECUBITAL  Final   Special Requests   Final    BOTTLES DRAWN AEROBIC AND ANAEROBIC Blood Culture results may not be optimal due to an inadequate volume of blood received in culture bottles   Culture   Final    NO GROWTH 5 DAYS Performed at [facility], [address], [place], [place] [address]    Report Status 03/05/2020 FINAL  Final  Blood Culture (routine x 2)     Status: None   Collection Time: 02/29/20  3:31 PM   Specimen: BLOOD  Result Value  Ref Range Status   Specimen Description BLOOD RIGHT ANTECUBITAL  Final   Special Requests   Final    BOTTLES DRAWN AEROBIC AND ANAEROBIC Blood Culture results may not be optimal due to an inadequate volume of blood received in culture bottles   Culture   Final    NO GROWTH 5 DAYS Performed at [facility], [address] [place], [place] [address]    Report Status 03/05/2020 FINAL  Final  Resp Panel by RT-PCR (Flu A&B, Covid) Nasopharyngeal Swab     Status: None   Collection Time: 02/29/20  3:51 PM   Specimen: Nasopharyngeal Swab; Nasopharyngeal(NP) swabs in vial transport medium  Result Value Ref Range Status   SARS Coronavirus 2 by RT PCR NEGATIVE NEGATIVE Final    Comment: (NOTE) SARS-CoV-2 target nucleic acids are NOT DETECTED.  The SARS-CoV-2 RNA is generally detectable in upper respiratory specimens during the acute phase of infection. The  lowest concentration of SARS-CoV-2 viral copies this assay can detect is 138 copies/mL. A negative result does not preclude SARS-Cov-2 infection and should not be used as the sole basis for treatment or other patient management decisions. A negative result may occur with  improper specimen collection/handling, submission of specimen other than nasopharyngeal swab, presence of viral mutation(s) within the areas targeted by this assay, and inadequate number of viral copies(<138 copies/mL). A negative result must be combined with clinical observations, patient history, and epidemiological information. The expected result is Negative.  Fact Sheet for Patients:  BloggerCourse.com  Fact Sheet for Healthcare Providers:  SeriousBroker.it  This test is no t yet approved or cleared by the [place] FDA and  has been authorized for detection and/or diagnosis of SARS-CoV-2 by FDA under an Emergency Use Authorization (EUA). This EUA will remain  in effect (meaning this test can be used) for the duration of the COVID-19 declaration under Section 564(b)(1) of the Act, 21 U.S.C.section 360bbb-3(b)(1), unless the authorization is terminated  or revoked sooner.       Influenza A by PCR NEGATIVE NEGATIVE Final   Influenza B by PCR NEGATIVE NEGATIVE Final    Comment: (NOTE) The Xpert Xpress SARS-CoV-2/FLU/RSV plus assay is intended as an aid in the diagnosis of influenza from Nasopharyngeal swab specimens and should not be used as a sole basis for treatment. Nasal washings and aspirates are unacceptable for Xpert Xpress SARS-CoV-2/FLU/RSV testing.  Fact Sheet for Patients: BloggerCourse.com  Fact Sheet for Healthcare Providers: SeriousBroker.it  This test is not yet approved or cleared by the [place] FDA and has been authorized for detection and/or diagnosis of SARS-CoV-2 by FDA under  an Emergency Use Authorization (EUA). This EUA will remain in effect (meaning this test can be used) for the duration of the COVID-19 declaration under Section 564(b)(1) of the Act, 21 U.S.C. section 360bbb-3(b)(1), unless the authorization is terminated or revoked.  Performed at [facility], [address], [place], [place] [address]   Urine culture     Status: Abnormal   Collection Time: 02/29/20  8:21 PM   Specimen: In/Out Cath Urine  Result Value Ref Range Status   Specimen Description IN/OUT CATH URINE  Final   Special Requests   Final    NONE Performed at [facility], [address] [place], [place] [address]    Culture MULTIPLE SPECIES PRESENT, SUGGEST RECOLLECTION (A)  Final   Report Status 03/02/2020 FINAL  Final     Labs: Basic Metabolic Panel: Recent Labs  Lab 03/01/20 0305 03/01/20 1339 03/01/20 1820 03/02/20 0210 03/03/20 0129 03/04/20 0212 03/05/20 0304  NA 136  --   --  134* 139 137 136  K 3.0*   < > 3.9 4.0 3.3* 4.2 4.2  CL 101  --   --  105 104 104 101  CO2 18*  --   --  18* 23 22 25   GLUCOSE 135*  --   --  148* 146* 155* 163*  BUN 43*  --   --  36* 30* 25* 24*  CREATININE 1.73*  --   --  1.22* 1.07* 0.92 0.92  CALCIUM 8.6*  --   --  8.8* 8.8* 8.3* 8.5*  MG 1.9   < > 2.0 2.0 1.8 1.7 1.6*  PHOS 3.1  --   --  2.0* 2.7 3.1 3.3   < > = values in this interval not displayed.   Liver Function Tests: Recent Labs  Lab 03/01/20 0305 03/02/20 0210 03/03/20 0129 03/04/20 0212 03/05/20 0304  AST 47* 85* 251* 205* 141*  ALT 29 44 142* 149* 135*  ALKPHOS 128* 167* 212* 199* 180*  BILITOT 2.7* 2.0* 1.3* 1.5* 1.6*  PROT 5.4* 5.5* 5.1* 5.5* 5.8*  ALBUMIN 1.8* 1.9* 1.8* 1.9* 2.1*   No results for input(s): LIPASE, AMYLASE in the last 168 hours. No results for input(s): AMMONIA in the last 168 hours. CBC: Recent Labs  Lab 03/01/20 0305 03/02/20 0210 03/03/20 0129 03/04/20 0212 03/05/20 0304  WBC 7.9 9.0 8.5 13.3* 17.1*  NEUTROABS 6.9  7.0 5.5 8.4* 14.5*  HGB 15.2* 13.5 13.0 14.0 15.2*  HCT 43.1 37.2 35.6* 39.0 41.7  MCV 88.0 86.9 87.5 87.2 87.8  PLT 199 177 156 172 202   Cardiac Enzymes: No results for input(s): CKTOTAL, CKMB, CKMBINDEX, TROPONINI in the last 168 hours. BNP: BNP (last 3 results) No results for input(s): BNP in the last 8760 hours.  ProBNP (last 3 results) No results for input(s): PROBNP in the last 8760 hours.  CBG: No results for input(s): GLUCAP in the last 168 hours.     Signed:  Rhetta MuraJai-Gurmukh Jazmin Ley MD   [facility] 03/05/2020, 10:20 AM

## 2020-03-05 NOTE — Progress Notes (Signed)
Pt provided discharge instructions and education. IV removed and intact. HR still elevated, MD aware. Pt denies complaints. Pt has all belongings. Telebox removed/ccmd notified. Pt tx via wheelchair to discharge lounge.

## 2020-03-13 ENCOUNTER — Ambulatory Visit: Payer: 59 | Admitting: Nurse Practitioner

## 2020-03-14 DIAGNOSIS — I2510 Atherosclerotic heart disease of native coronary artery without angina pectoris: Secondary | ICD-10-CM | POA: Diagnosis not present

## 2020-03-14 DIAGNOSIS — Z87891 Personal history of nicotine dependence: Secondary | ICD-10-CM | POA: Diagnosis not present

## 2020-03-14 DIAGNOSIS — I10 Essential (primary) hypertension: Secondary | ICD-10-CM | POA: Diagnosis not present

## 2020-03-14 DIAGNOSIS — J189 Pneumonia, unspecified organism: Secondary | ICD-10-CM | POA: Diagnosis not present

## 2020-03-14 DIAGNOSIS — R7303 Prediabetes: Secondary | ICD-10-CM | POA: Diagnosis not present

## 2020-03-14 DIAGNOSIS — R69 Illness, unspecified: Secondary | ICD-10-CM | POA: Diagnosis not present

## 2020-03-14 DIAGNOSIS — I480 Paroxysmal atrial fibrillation: Secondary | ICD-10-CM | POA: Diagnosis not present

## 2020-03-14 DIAGNOSIS — E7800 Pure hypercholesterolemia, unspecified: Secondary | ICD-10-CM | POA: Diagnosis not present

## 2020-03-14 DIAGNOSIS — Z6828 Body mass index (BMI) 28.0-28.9, adult: Secondary | ICD-10-CM | POA: Diagnosis not present

## 2020-03-14 DIAGNOSIS — Z23 Encounter for immunization: Secondary | ICD-10-CM | POA: Diagnosis not present

## 2020-03-19 ENCOUNTER — Other Ambulatory Visit: Payer: Self-pay

## 2020-03-19 ENCOUNTER — Encounter: Payer: Self-pay | Admitting: Nurse Practitioner

## 2020-03-19 ENCOUNTER — Ambulatory Visit: Payer: Medicare HMO | Admitting: Nurse Practitioner

## 2020-03-19 VITALS — BP 140/100 | HR 58 | Ht 62.0 in | Wt 156.6 lb

## 2020-03-19 DIAGNOSIS — G4733 Obstructive sleep apnea (adult) (pediatric): Secondary | ICD-10-CM

## 2020-03-19 DIAGNOSIS — I10 Essential (primary) hypertension: Secondary | ICD-10-CM

## 2020-03-19 DIAGNOSIS — I480 Paroxysmal atrial fibrillation: Secondary | ICD-10-CM

## 2020-03-19 DIAGNOSIS — H539 Unspecified visual disturbance: Secondary | ICD-10-CM | POA: Diagnosis not present

## 2020-03-19 DIAGNOSIS — Z7901 Long term (current) use of anticoagulants: Secondary | ICD-10-CM

## 2020-03-19 MED ORDER — LISINOPRIL-HYDROCHLOROTHIAZIDE 20-12.5 MG PO TABS: 2.0000 | ORAL_TABLET | Freq: Every day | ORAL | 3 refills | Status: DC

## 2020-03-19 NOTE — Patient Instructions (Addendum)
After Visit Summary:  We will be checking the following labs today - NONE   Medication Instructions:    Continue with your current medicines. BUT  I am restarting your Lisinopril HCTZ 20/12.5 to take 2 pills daily - as before.    If you need a refill on your cardiac medications before your next appointment, please call your pharmacy.     Testing/Procedures To Be Arranged:  Carotid doppler  Follow-Up:   See me in one month  See Dr. Shari Prows on 4 months as a new patient.     At [facility], you and your health needs are our priority.  As part of our continuing mission to provide you with exceptional heart care, we have created designated Provider Care Teams.  These Care Teams include your primary Cardiologist (physician) and Advanced Practice Providers (APPs -  Physician Assistants and Nurse Practitioners) who all work together to provide you with the care you need, when you need it.  Special Instructions:  . Stay safe, wash your hands for at least 20 seconds and wear a mask when needed.  . It was good to talk with you today.  . Call your eye doctor today to get a visit.    Call the [facility] office at [phone] if you have any questions, problems or concerns.

## 2020-03-20 ENCOUNTER — Ambulatory Visit (HOSPITAL_COMMUNITY)
Admission: RE | Admit: 2020-03-20 | Discharge: 2020-03-20 | Disposition: A | Discharge status: Home / Self Care | Payer: Medicare HMO | Source: Ambulatory Visit | Attending: Cardiovascular Disease | Admitting: Cardiovascular Disease

## 2020-03-20 DIAGNOSIS — H539 Unspecified visual disturbance: Secondary | ICD-10-CM | POA: Diagnosis not present

## 2020-03-21 ENCOUNTER — Telehealth: Payer: Self-pay | Admitting: Nurse Practitioner

## 2020-03-21 NOTE — Telephone Encounter (Signed)
This was stated in carotid result note.  It is ok to get booster.

## 2020-03-21 NOTE — Telephone Encounter (Signed)
    Pt would like to asked Lawson Fiscal if its safe for her to get the covid booster, she said she had heart issue last week and doesn't know if she needs to wait to get the booster

## 2020-03-25 DIAGNOSIS — H40029 Open angle with borderline findings, high risk, unspecified eye: Secondary | ICD-10-CM | POA: Diagnosis not present

## 2020-04-02 ENCOUNTER — Telehealth: Payer: Self-pay | Admitting: *Deleted

## 2020-04-02 NOTE — Telephone Encounter (Signed)
S/w pt Kari Harrison reviewed pt's eye appt comments that  Dr. Darrick Meigs @ [facility] # [phone] sent to office to let pt know Kari Harrison seen the results and Bp control Is imperative.

## 2020-04-18 NOTE — Progress Notes (Signed)
CARDIOLOGY OFFICE NOTE  Date:  05/01/2020    Katy Apo Date of Birth: [birth year] Medical Record #[id number]  PCP:  Sigmund Hazel, MD  Cardiologist:  Tyrone Sage - going to Dr. Shari Prows  Chief Complaint  Patient presents with  . Follow-up    History of Present Illness: Kari Harrison is a 65 y.o. female who presents today for a follow up visit. Former patient of Dr. Alford Highland.  She has a history of knownCAD, HTN, tobacco abuse, lung nodules and HLD.She has had priorcardiac CT showing severe 3 VD -with subsequentPCI of the LCXin 2016.  [facility]ed to the hospit[facility] last December2020 with palpitations - found to have AF - she converted- no cardioversion. She had follow up in the AF clinic and was also to follow back up here. She is on anticoagulation now.Stress testing wasdone in January and was low risk.She was weaning off [facility]. Was to have sleep study. Smoking less.   When seen last June - she was doing well - trying to get her CPAP. Lots of insurance issues. Still smoking some.   She was admitted back in December with pneumonia - she was to have a follow up CT in 3 to 6 months along with repeat CXR - however there was NO CT this last admission. LFTs slightly. Up. She had AF and received IV amiodarone for a short course. I then saw her in follow up - she was slowly recovering. To have follow up CXR with PCP. BP up - had had her ACE/HCTZ stopped during that admission. Some vision issues as well - ended up seeing her eye doctor - Hollenhorst plaque noted - even more reason to get her BP down - to get carotid studies - these were stable. She was not smoking.   Comes in today. Here alone. Doing better. She feels great. No chest pain. Not short of breath. BP is great. Not dizzy. Not smoking. She is very happy with how she is doing. She has cut her hours at work in half - spending more time with family and her new dog.   Past Medical History:   Diagnosis Date  . CAD (coronary artery disease)    a. Cardiac CTA: severe 3VD;  b. 05/2014 Cath/PCI: LM nl, LAD 50-60p/m, 48m, D1 50ost/m, RI small/plaque, LCX dominant 20-30p, 18m, 60/90d (2.25x24 Promus DES), OM2 40-33m, RCA nondom, mod diff plaque, EF 55%.  Marland Kitchen Hyperlipidemia   . Hypertension   . Pneumonia 03/01/2020  . Pulmonary nodules    a. 05/2014 CTA: left lung pulm nodules w/ rec for 3 month f/u noncontrast CT.  . Tobacco abuse     Past Surgical History:  Procedure Laterality Date  . CESAREAN SECTION    . LEFT HEART CATHETERIZATION WITH CORONARY ANGIOGRAM N/A 06/16/2014   Procedure: LEFT HEART CATHETERIZATION WITH CORONARY ANGIOGRAM;  Surgeon: Dolores Patty, MD;  Location[facility] CATH LAB;  Service: Cardiovascular;  Laterality: N/A;     Medications: Current Meds  Medication Sig  . apixaban (ELIQUIS) 5 MG TABS tablet Take 1 tablet (5 mg total) by mouth 2 (two) times daily.  . Ascorbic Acid (VITAMIN C) 1000 MG tablet Take 1,000 mg by mouth daily.  Marland Kitchen atenolol (TENORMIN) 50 MG tablet Take 50 mg by mouth daily.  Marland Kitchen atorvastatin (LIPITOR) 80 MG tablet take 1 tablet by mouth once daily ; MAKE DOCTOR'S APPOINTMENT !  . Cholecalciferol (VITAMIN D) 50 MCG (2000 UT) tablet Take 2,000 Units by mouth daily.  Marland Kitchen  lisinopril-hydrochlorothiazide (ZESTORETIC) 20-12.5 MG tablet Take 2 tablets by mouth daily.  Marland Kitchen loratadine (CLARITIN) 10 MG tablet Take 10 mg by mouth daily.  . Multiple Minerals (CALCIUM/MAGNESIUM/ZINC PO) Take 1 tablet by mouth daily.  Marland Kitchen venlafaxine XR (EFFEXOR-XR) 75 MG 24 hr capsule Take 75 mg by mouth daily.     Allergies: No Known Allergies  Social History: The patient  reports that she has been smoking cigarettes. She has a 0.48 pack-year smoking history. She has never used smokeless tobacco. She reports previous drug use. Drug: Marijuana. She reports that she does not drink alcohol.   Family History: The patient's family history includes Arthritis in her mother; Heart  attack in her father; Hypertension in her sister; Macular degeneration in her mother.   Review of Systems: Please see the history of present illness.   All other systems are reviewed and negative.   Physical Exam: VS:  BP 102/72   Pulse 62   Ht 5\' 1"  (1.549 m)   Wt 157 lb 6.4 oz (71.4 kg)   SpO2 97%   BMI 29.74 kg/m  .  BMI Body mass index is 29.74 kg/m.  Wt Readings from Last 3 Encounters:  05/01/20 157 lb 6.4 oz (71.4 kg)  03/19/20 156 lb 9.6 oz (71 kg)  03/02/20 168 lb 3.2 oz (76.3 kg)    General: Pleasant. Well developed, well nourished and in no acute distress.   Cardiac: Regular rate and rhythm. No murmurs, rubs, or gallops. No edema.  Respiratory:  Lungs are clear to auscultation bilaterally with normal work of breathing.  GI: Soft and nontender.  MS: No deformity or atrophy. Gait and ROM intact.  Skin: Warm and dry. Color is normal.  Neuro:  Strength and sensation are intact and no gross focal deficits noted.  Psych: Alert, appropriate and with normal affect.   LABORATORY DATA:  EKG:  EKG is not ordered today.   Lab Results  Component Value Date   WBC 17.1 (H) 03/05/2020   HGB 15.2 (H) 03/05/2020   HCT 41.7 03/05/2020   PLT 202 03/05/2020   GLUCOSE 163 (H) 03/05/2020   CHOL 71 03/01/2020   TRIG 237 (H) 03/01/2020   HDL <10 (L) 03/01/2020   LDLCALC NOT CALCULATED 03/01/2020   ALT 135 (H) 03/05/2020   AST 141 (H) 03/05/2020   NA 136 03/05/2020   K 4.2 03/05/2020   CL 101 03/05/2020   CREATININE 0.92 03/05/2020   BUN 24 (H) 03/05/2020   CO2 25 03/05/2020   TSH 4.492 02/20/2019   INR 1.8 (H) 03/01/2020   HGBA1C 6.1 (H) 02/20/2019       BNP (last 3 results) No results for input(s): BNP in the last 8760 hours.  ProBNP (last 3 results) No results for input(s): PROBNP in the last 8760 hours.   Other Studies Reviewed Today:  Carotid Doppler Summary 02/2020:  Right Carotid: Velocities in the right ICA are consistent with a 1-39%  stenosis.  Non-hemodynamically significant plaque <50% noted in the  CCA.  Left Carotid: Velocities in the left ICA are consistent with a 1-39%  stenosis. Non-hemodynamically significant plaque <50% noted in the  CCA.   Vertebrals: Bilateral vertebral arteries demonstrate antegrade flow.  Subclavians: Normal flow hemodynamics were seen in bilateral subclavian       arteries.   *See table(s) above for measurements and observations.   Electronically signed by 03/2020 MD on 03/21/2020 at 8:07:10 AM.    MyoviewStudy Highlights1/2021   Nuclear stress EF: 61%.  EKG nondiagnostic due to baseline changes  Normal perfusion. No ischemia or scar  This is a low risk study.   Echocardiogram 02/20/2019: 1. Left ventricular ejection fraction, by visual estimation, is 60 to 65%. The left ventricle has normal function. There is mildly increased left ventricular hypertrophy. 2. Left ventricular diastolic parameters are indeterminate. 3. Global right ventricle has normal systolic function.The right ventricular size is normal. No increase in right ventricular wall thickness. 4. Left atrial size was normal. 5. Right atrial size was normal. 6. Moderate aortic valve annular calcification. 7. The mitral valve is normal in structure. No evidence of mitral valve regurgitation. No evidence of mitral stenosis. 8. The tricuspid valve is normal in structure. Tricuspid valve regurgitation is not demonstrated. 9. The aortic valve was not well visualized. Aortic valve regurgitation is not visualized. No evidence of aortic valve sclerosis or stenosis. 10. There is Moderate calcification of the aortic valve. 11. There is Moderate thickening of the aortic valve. 12. The pulmonic valve was not well visualized. Pulmonic valve regurgitation is not visualized. 13. The inferior vena cava is normal in size with greater than 50% respiratory variability, suggesting right atrial pressure of 3  mmHg.   PROCEDURE2016:Cath/PCI distal left circumflex. Left main: Mild calcification. No significant stenosis.   LAD: Long vessel wraps apex. 50-60% lesion in proximal to mid LAD prior to take-off of first diagonal. There is a 40% lesion in the mid LAD just after the D1 take-off. Mild plaque in distal LAD. In large D1 there is 50% lesion ostially and 50% lesion in midsection.  SFK:CLEXN ramus with moderate ostial plaque. Large dominant circumflex. Gives off large branching OM-1. 3 PLS and a PDA. In the proximal LCX there is a 20-30% stenosis. In the mid LCX there is a 50% lesion just after the take-off of the OM-1.In the distal LCX there is a 60% lesion after the first PL and a 90% lesion prior to the 2nd PL. In the large OM-1 there is a 40-50% mid lesion   RCA: Moderate sized non-dominant vessel with moderate diffuse plaque.  LV-gram done in the RAO projection: Ejection fraction =55% nor regional wall motion abnormalities   IMPRESSIONS:  1. Successful PCI of the distalleft circumflex arterywith a 2.25 x 24 Promus drug-eluting stent, postdilated to greater than 2.5 mm in diameter. 2.Unsuccessful attempt at diagnostic cardiac cath from the right radial approach due to radial loop.  RECOMMENDATION:Continue dual antiplatelet therapy for at least a year. Continue aggressive secondary prevention.     Assessment/Plan:  1. 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   3. Prior sepsis/pneumonia/UTI - resolved. Follow up CXR by PCP  4. Tobacco abuse - not smoking.   5. HTN - back on her regular medicines - needs labs today. BP at goal.   6. CAD with prior PCI/DES to LCX - low risk Myoview 04/2019 - no worrisome symptoms - continue with current regimen and CV risk factor modification.   7. HLD - on statin  8. Carotid disease - favor repeat in about 1 to 2 years.    Current medicines are reviewed with the patient today.  The patient does not have concerns regarding medicines other than what has been noted above.  The following changes have been made:  See above.  Labs/ tests ordered today include:    Orders Placed This Encounter  Procedures  . Basic  metabolic panel  . Hepatic function panel  . CBC     Disposition:   FU with Dr. Shari ProwsPemberton in May as planned. She looks good on her current regimen and felt to be doing very well.    Patient is agreeable to this plan and will call if any problems develop in the interim.   SignedNorma Fredrickson: Cherie Lasalle, NP  05/01/2020 9:29 AM  [facility] [address] [place], [place]  [address] Phone: [phone] Fax: [phone]

## 2020-04-27 DIAGNOSIS — E559 Vitamin D deficiency, unspecified: Secondary | ICD-10-CM | POA: Diagnosis not present

## 2020-04-27 DIAGNOSIS — M8580 Other specified disorders of bone density and structure, unspecified site: Secondary | ICD-10-CM | POA: Diagnosis not present

## 2020-04-27 DIAGNOSIS — I480 Paroxysmal atrial fibrillation: Secondary | ICD-10-CM | POA: Diagnosis not present

## 2020-04-27 DIAGNOSIS — Z87891 Personal history of nicotine dependence: Secondary | ICD-10-CM | POA: Diagnosis not present

## 2020-04-27 DIAGNOSIS — Z955 Presence of coronary angioplasty implant and graft: Secondary | ICD-10-CM | POA: Diagnosis not present

## 2020-04-27 DIAGNOSIS — E7800 Pure hypercholesterolemia, unspecified: Secondary | ICD-10-CM | POA: Diagnosis not present

## 2020-04-27 DIAGNOSIS — Z0000 Encounter for general adult medical examination without abnormal findings: Secondary | ICD-10-CM | POA: Diagnosis not present

## 2020-04-27 DIAGNOSIS — Z6829 Body mass index (BMI) 29.0-29.9, adult: Secondary | ICD-10-CM | POA: Diagnosis not present

## 2020-04-27 DIAGNOSIS — I10 Essential (primary) hypertension: Secondary | ICD-10-CM | POA: Diagnosis not present

## 2020-04-27 DIAGNOSIS — R69 Illness, unspecified: Secondary | ICD-10-CM | POA: Diagnosis not present

## 2020-04-30 ENCOUNTER — Other Ambulatory Visit: Payer: Self-pay | Admitting: Family Medicine

## 2020-04-30 DIAGNOSIS — Z1231 Encounter for screening mammogram for malignant neoplasm of breast: Secondary | ICD-10-CM

## 2020-04-30 DIAGNOSIS — M8580 Other specified disorders of bone density and structure, unspecified site: Secondary | ICD-10-CM

## 2020-05-01 ENCOUNTER — Ambulatory Visit (INDEPENDENT_AMBULATORY_CARE_PROVIDER_SITE_OTHER): Payer: Commercial Managed Care - PPO | Admitting: Nurse Practitioner

## 2020-05-01 ENCOUNTER — Other Ambulatory Visit: Payer: Self-pay

## 2020-05-01 ENCOUNTER — Encounter: Payer: Self-pay | Admitting: Nurse Practitioner

## 2020-05-01 VITALS — BP 102/72 | HR 62 | Ht 61.0 in | Wt 157.4 lb

## 2020-05-01 DIAGNOSIS — I480 Paroxysmal atrial fibrillation: Secondary | ICD-10-CM

## 2020-05-01 DIAGNOSIS — Z7901 Long term (current) use of anticoagulants: Secondary | ICD-10-CM

## 2020-05-01 DIAGNOSIS — I10 Essential (primary) hypertension: Secondary | ICD-10-CM

## 2020-05-01 LAB — CBC
Hematocrit: 43.3 % (ref 34.0–46.6)
Hemoglobin: 15.1 g/dL (ref 11.1–15.9)
MCH: 31.6 pg (ref 26.6–33.0)
MCHC: 34.9 g/dL (ref 31.5–35.7)
MCV: 91 fL (ref 79–97)
Platelets: 294 10*3/uL (ref 150–450)
RBC: 4.78 x10E6/uL (ref 3.77–5.28)
RDW: 12.8 % (ref 11.7–15.4)
WBC: 9.9 10*3/uL (ref 3.4–10.8)

## 2020-05-01 LAB — BASIC METABOLIC PANEL
BUN/Creatinine Ratio: 15 (ref 12–28)
BUN: 13 mg/dL (ref 8–27)
CO2: 26 mmol/L (ref 20–29)
Calcium: 10.1 mg/dL (ref 8.7–10.3)
Chloride: 102 mmol/L (ref 96–106)
Creatinine, Ser: 0.86 mg/dL (ref 0.57–1.00)
GFR calc Af Amer: 82 mL/min/{1.73_m2} (ref >59)
GFR calc non Af Amer: 71 mL/min/{1.73_m2} (ref >59)
Glucose: 117 mg/dL — ABNORMAL HIGH (ref 65–99)
Potassium: 4.4 mmol/L (ref 3.5–5.2)
Sodium: 141 mmol/L (ref 134–144)

## 2020-05-01 LAB — HEPATIC FUNCTION PANEL
ALT: 24 IU/L (ref 0–32)
AST: 28 IU/L (ref 0–40)
Albumin: 4.1 g/dL (ref 3.8–4.8)
Alkaline Phosphatase: 110 IU/L (ref 44–121)
Bilirubin Total: 0.6 mg/dL (ref 0.0–1.2)
Bilirubin, Direct: 0.24 mg/dL (ref 0.00–0.40)
Total Protein: 6.8 g/dL (ref 6.0–8.5)

## 2020-05-01 NOTE — Patient Instructions (Addendum)
After Visit Summary:  We will be checking the following labs today - BMET, CBC and HPF   Medication Instructions:    Continue with your current medicines.    If you need a refill on your cardiac medications before your next appointment, please call your pharmacy.     Testing/Procedures To Be Arranged:  N/A  Follow-Up:   See Dr. Shari Prows in May as planned .    At [facility], you and your health needs are our priority.  As part of our continuing mission to provide you with exceptional heart care, we have created designated Provider Care Teams.  These Care Teams include your primary Cardiologist (physician) and Advanced Practice Providers (APPs -  Physician Assistants and Nurse Practitioners) who all work together to provide you with the care you need, when you need it.  Special Instructions:  . Stay safe, wash your hands for at least 20 seconds and wear a mask when needed.  . It was good to talk with you today.  Marland Kitchen Keep up the good work!   Call the [facility] office at [phone] if you have any questions, problems or concerns.

## 2020-05-18 DIAGNOSIS — Z1211 Encounter for screening for malignant neoplasm of colon: Secondary | ICD-10-CM | POA: Diagnosis not present

## 2020-06-15 ENCOUNTER — Inpatient Hospital Stay: Admission: RE | Admit: 2020-06-15 | Payer: Medicare HMO | Source: Ambulatory Visit

## 2020-08-08 ENCOUNTER — Ambulatory Visit: Payer: Commercial Managed Care - PPO

## 2020-08-09 ENCOUNTER — Ambulatory Visit
Admission: RE | Admit: 2020-08-09 | Discharge: 2020-08-09 | Disposition: A | Discharge status: Home / Self Care | Payer: Commercial Managed Care - PPO | Source: Ambulatory Visit | Attending: Family Medicine | Admitting: Family Medicine

## 2020-08-09 ENCOUNTER — Other Ambulatory Visit: Payer: Self-pay

## 2020-08-09 DIAGNOSIS — Z1231 Encounter for screening mammogram for malignant neoplasm of breast: Secondary | ICD-10-CM

## 2020-08-13 ENCOUNTER — Ambulatory Visit: Payer: Medicare HMO | Admitting: Cardiology

## 2020-08-22 NOTE — Progress Notes (Signed)
Cardiology Office Note:    Date:  08/23/2020   ID:  Chace Bisch, DOB 03-Apr-1955, MRN [id number]  PCP:  Sigmund Hazel, MD   [facility] Providers Cardiologist:  Kristeen Miss, MD {   Referring MD: Sigmund Hazel, MD    History of Present Illness:    Kari Harrison is a 65 y.o. female with a hx of multivessel CAD s/p PCI to LCx in 2016, Afib, HTN, tobacco abuse and HLD who was previously followed by Dr. Tyrone Sage who now presents to clinic for follow-up.  Last saw Lawson Fiscal on 03/19/20 where she was having visual disturbances. Had recent admission with pneumonia and UTI. Was making slow recovery at that time.  Today, the patient states that she is feeling much improved since last visit. No chest pain, SOB, palpitations, lightheadedness, and dizziness. Had one episode of chest discomfort yesterday that resolved with belching. No bleeding issues. She is active and is walking all the time at work with no exertional symptoms. No known recurrence of Afib.  Past Medical History:  Diagnosis Date  . CAD (coronary artery disease)    a. Cardiac CTA: severe 3VD;  b. 05/2014 Cath/PCI: LM nl, LAD 50-60p/m, 60m, D1 50ost/m, RI small/plaque, LCX dominant 20-30p, 8m, 60/90d (2.25x24 Promus DES), OM2 40-31m, RCA nondom, mod diff plaque, EF 55%.  Marland Kitchen Hyperlipidemia   . Hypertension   . Pneumonia 03/01/2020  . Pulmonary nodules    a. 05/2014 CTA: left lung pulm nodules w/ rec for 3 month f/u noncontrast CT.  . Tobacco abuse     Past Surgical History:  Procedure Laterality Date  . CESAREAN SECTION    . LEFT HEART CATHETERIZATION WITH CORONARY ANGIOGRAM N/A 06/16/2014   Procedure: LEFT HEART CATHETERIZATION WITH CORONARY ANGIOGRAM;  Surgeon: Dolores Patty, MD;  Location: [facility] CATH LAB;  Service: Cardiovascular;  Laterality: N/A;    Current Medications: Current Meds  Medication Sig  . Ascorbic Acid (VITAMIN C) 1000 MG tablet Take 1,000 mg by mouth daily.  Marland Kitchen atenolol (TENORMIN) 50 MG tablet  Take 50 mg by mouth daily.  Marland Kitchen atorvastatin (LIPITOR) 80 MG tablet take 1 tablet by mouth once daily ; MAKE DOCTOR'S APPOINTMENT !  . Cholecalciferol (VITAMIN D) 50 MCG (2000 UT) tablet Take 2,000 Units by mouth daily.  Marland Kitchen lisinopril-hydrochlorothiazide (ZESTORETIC) 20-12.5 MG tablet Take 2 tablets by mouth daily.  Marland Kitchen loratadine (CLARITIN) 10 MG tablet Take 10 mg by mouth daily.  . Multiple Minerals (CALCIUM/MAGNESIUM/ZINC PO) Take 1 tablet by mouth daily.  Marland Kitchen venlafaxine XR (EFFEXOR-XR) 75 MG 24 hr capsule Take 75 mg by mouth daily.     Allergies:   Patient has no known allergies.   Social History   Socioeconomic History  . Marital status: Married    Spouse name: Not on file  . Number of children: Not on file  . Years of education: Not on file  . Highest education level: Not on file  Occupational History  . Occupation: Agricultural consultant  Tobacco Use  . Smoking status: Current Every Day Smoker    Packs/day: 0.02    Years: 24.00    Pack years: 0.48    Types: Cigarettes  . Smokeless tobacco: Never Used  . Tobacco comment: denies need for a patch/ less than 6 a day  Vaping Use  . Vaping Use: Never used  Substance and Sexual Activity  . Alcohol use: No    Alcohol/week: 0.0 standard drinks  . Drug use: Not Currently    Types: Marijuana  Comment: occasional use  . Sexual activity: Not on file  Other Topics Concern  . Not on file  Social History Narrative  . Not on file   Social Determinants of Health   Financial Resource Strain: Not on file  Food Insecurity: Not on file  Transportation Needs: Not on file  Physical Activity: Not on file  Stress: Not on file  Social Connections: Not on file     Family History: The patient's family history includes Arthritis in her mother; Heart attack in her father; Hypertension in her sister; Macular degeneration in her mother. There is no history of Atrial fibrillation.  ROS:   Please see the history of present illness.    Review of  Systems  Constitutional: Negative for chills and fever.  HENT: Negative for hearing loss.   Eyes: Negative for blurred vision.  Respiratory: Negative for shortness of breath.   Cardiovascular: Negative for chest pain, palpitations, orthopnea, claudication, leg swelling and PND.  Gastrointestinal: Negative for blood in stool, melena, nausea and vomiting.  Genitourinary: Negative for hematuria.  Musculoskeletal: Negative for falls.  Neurological: Negative for dizziness and loss of consciousness.  Psychiatric/Behavioral: Negative for substance abuse.    EKGs/Labs/Other Studies Reviewed:    The following studies were reviewed today: TTE 02/20/19: IMPRESSIONS  1. Left ventricular ejection fraction, by visual estimation, is 60 to  65%. The left ventricle has normal function. There is mildly increased  left ventricular hypertrophy.  2. Left ventricular diastolic parameters are indeterminate.  3. Global right ventricle has normal systolic function.The right  ventricular size is normal. No increase in right ventricular wall  thickness.  4. Left atrial size was normal.  5. Right atrial size was normal.  6. Moderate aortic valve annular calcification.  7. The mitral valve is normal in structure. No evidence of mitral valve  regurgitation. No evidence of mitral stenosis.  8. The tricuspid valve is normal in structure. Tricuspid valve  regurgitation is not demonstrated.  9. The aortic valve was not well visualized. Aortic valve regurgitation  is not visualized. No evidence of aortic valve sclerosis or stenosis.  10. There is Moderate calcification of the aortic valve.  11. There is Moderate thickening of the aortic valve.  12. The pulmonic valve was not well visualized. Pulmonic valve  regurgitation is not visualized.  13. The inferior vena cava is normal in size with greater than 50%  respiratory variability, suggesting right atrial pressure of 3 mmHg.  Myoview  04/08/19:  Nuclear stress EF: 61%.  EKG nondiagnostic due to baseline changes  Normal perfusion. No ischemia or scar  This is a low risk study.  PROCEDURE2016:Cath/PCI distal left circumflex. Left main: Mild calcification. No significant stenosis.   LAD: Long vessel wraps apex. 50-60% lesion in proximal to mid LAD prior to take-off of first diagonal. There is a 40% lesion in the mid LAD just after the D1 take-off. Mild plaque in distal LAD. In large D1 there is 50% lesion ostially and 50% lesion in midsection.  ZOX:WRUEALCX:Small ramus with moderate ostial plaque. Large dominant circumflex. Gives off large branching OM-1. 3 PLS and a PDA. In the proximal LCX there is a 20-30% stenosis. In the mid LCX there is a 50% lesion just after the take-off of the OM-1.In the distal LCX there is a 60% lesion after the first PL and a 90% lesion prior to the 2nd PL. In the large OM-1 there is a 40-50% mid lesion   RCA: Moderate sized non-dominant vessel with moderate  diffuse plaque.  LV-gram done in the RAO projection: Ejection fraction =55% nor regional wall motion abnormalities   EKG:  EKG not obtained today  Recent Labs: 03/05/2020: Magnesium 1.6 05/01/2020: ALT 24; BUN 13; Creatinine, Ser 0.86; Hemoglobin 15.1; Platelets 294; Potassium 4.4; Sodium 141  Recent Lipid Panel    Component Value Date/Time   CHOL 71 03/01/2020 0305   CHOL 131 09/12/2019 0853   TRIG 237 (H) 03/01/2020 0305   HDL <10 (L) 03/01/2020 0305   HDL 42 09/12/2019 0853   CHOLHDL NOT CALCULATED 03/01/2020 0305   VLDL 47 (H) 03/01/2020 0305   LDLCALC NOT CALCULATED 03/01/2020 0305   LDLCALC 62 09/12/2019 0853     Risk Assessment/Calculations:    CHA2DS2-VASc Score = 4  {This indicates a 4.8% annual risk of stroke. The patient's score is based upon: CHF History: No HTN History: Yes Diabetes History: No Stroke History: No Vascular Disease History: Yes Age Score: 1 Gender Score: 1     Physical Exam:     VS:  BP 124/80   Pulse 61   Ht 5\' 1"  (1.549 m)   Wt 152 lb 12.8 oz (69.3 kg)   SpO2 99%   BMI 28.87 kg/m     Wt Readings from Last 3 Encounters:  08/23/20 152 lb 12.8 oz (69.3 kg)  05/01/20 157 lb 6.4 oz (71.4 kg)  03/19/20 156 lb 9.6 oz (71 kg)     GEN:  Well nourished, well developed in no acute distress HEENT: Normal NECK: No JVD; No carotid bruits CARDIAC: RRR, 1/6 systolic murmur. No rubs, gallops RESPIRATORY:  Clear to auscultation without rales, wheezing or rhonchi  ABDOMEN: Soft, non-tender, non-distended MUSCULOSKELETAL:  No edema; No deformity  SKIN: Warm and dry NEUROLOGIC:  Alert and oriented x 3 PSYCHIATRIC:  Normal affect   ASSESSMENT:    1. Coronary artery disease involving native coronary artery of native heart without angina pectoris   2. Paroxysmal atrial fibrillation (HCC)   3. Benign essential HTN   4. Mixed hyperlipidemia   5. Tobacco abuse    PLAN:    In order of problems listed above:  #Coronary Artery Disease s/p LCx PCI in 2016: Doing well with no anginal symptoms. Myoview negative for ischemia in 2021. TTE with LVEF 60-65%. -Not on ASA due to need for [facility] -Continue lipitor 80mg  daily -Continue atenolol 50mg  daily -Continue lisinopril-HCTZ 40mg -12.5mg  daily  #Paroxysmal Afib: CHADs-vasc 4. No recurrent palpitations. Tolerating anticoagulation without issues.  -Continue apixaban 5mg  BID -Continue atenolol 50mg  daily  #HTN: Well controlled and at goal <120/80s. -Continue atenolol 50mg  daily -Continue lisinopril-HCTZ 40mg -12.5mg  daily  #HLD: Well controlled with LDL at goal 62 (09/12/19). -Continue lipitor 80mg  daily -May need vascepa in the future for TG; will monitor  #Tobacco Use: Quit after her infection in 02/2020 but has started back recently with a couple cigarettes per week. She is very motivated to quit and is going to see a cessation counselor. -Encouraged her to quit; patient is seeing a cessation counselor      Medication Adjustments/Labs and Tests Ordered: Current medicines are reviewed at length with the patient today.  Concerns regarding medicines are outlined above.  No orders of the defined types were placed in this encounter.  No orders of the defined types were placed in this encounter.   Patient Instructions  Medication Instructions:   Your physician recommends that you continue on your current medications as directed. Please refer to the Current Medication list given to you today.  *  If you need a refill on your cardiac medications before your next appointment, please call your pharmacy*   Follow-Up: At [facility], you and your health needs are our priority.  As part of our continuing mission to provide you with exceptional heart care, we have created designated Provider Care Teams.  These Care Teams include your primary Cardiologist (physician) and Advanced Practice Providers (APPs -  Physician Assistants and Nurse Practitioners) who all work together to provide you with the care you need, when you need it.  We recommend signing up for the patient portal called "MyChart".  Sign up information is provided on this After Visit Summary.  MyChart is used to connect with patients for Virtual Visits (Telemedicine).  Patients are able to view lab/test results, encounter notes, upcoming appointments, etc.  Non-urgent messages can be sent to your provider as well.   To learn more about what you can do with MyChart, go to [email or web address].    Your next appointment:   8 month(s)  The format for your next appointment:   In Person  Provider:   Laurance Flatten, MD       Signed, Meriam Sprague, MD  08/23/2020 9:28 AM    [facility]

## 2020-08-23 ENCOUNTER — Other Ambulatory Visit: Payer: Self-pay

## 2020-08-23 ENCOUNTER — Encounter: Payer: Self-pay | Admitting: Cardiology

## 2020-08-23 ENCOUNTER — Ambulatory Visit (INDEPENDENT_AMBULATORY_CARE_PROVIDER_SITE_OTHER): Payer: Commercial Managed Care - PPO | Admitting: Cardiology

## 2020-08-23 VITALS — BP 124/80 | HR 61 | Ht 61.0 in | Wt 152.8 lb

## 2020-08-23 DIAGNOSIS — I2510 Atherosclerotic heart disease of native coronary artery without angina pectoris: Secondary | ICD-10-CM | POA: Diagnosis not present

## 2020-08-23 DIAGNOSIS — E782 Mixed hyperlipidemia: Secondary | ICD-10-CM | POA: Diagnosis not present

## 2020-08-23 DIAGNOSIS — I480 Paroxysmal atrial fibrillation: Secondary | ICD-10-CM

## 2020-08-23 DIAGNOSIS — Z720 Tobacco use: Secondary | ICD-10-CM | POA: Diagnosis not present

## 2020-08-23 DIAGNOSIS — I10 Essential (primary) hypertension: Secondary | ICD-10-CM | POA: Diagnosis not present

## 2020-08-23 NOTE — Patient Instructions (Signed)
Medication Instructions:   Your physician recommends that you continue on your current medications as directed. Please refer to the Current Medication list given to you today.  *If you need a refill on your cardiac medications before your next appointment, please call your pharmacy*   Follow-Up: At [facility], you and your health needs are our priority.  As part of our continuing mission to provide you with exceptional heart care, we have created designated Provider Care Teams.  These Care Teams include your primary Cardiologist (physician) and Advanced Practice Providers (APPs -  Physician Assistants and Nurse Practitioners) who all work together to provide you with the care you need, when you need it.  We recommend signing up for the patient portal called "MyChart".  Sign up information is provided on this After Visit Summary.  MyChart is used to connect with patients for Virtual Visits (Telemedicine).  Patients are able to view lab/test results, encounter notes, upcoming appointments, etc.  Non-urgent messages can be sent to your provider as well.   To learn more about what you can do with MyChart, go to [email or web address].    Your next appointment:   8 month(s)  The format for your next appointment:   In Person  Provider:   Heather Pemberton, MD     

## 2020-09-17 ENCOUNTER — Ambulatory Visit
Admission: RE | Admit: 2020-09-17 | Discharge: 2020-09-17 | Disposition: A | Discharge status: Home / Self Care | Payer: Commercial Managed Care - PPO | Source: Ambulatory Visit | Attending: Family Medicine | Admitting: Family Medicine

## 2020-09-17 ENCOUNTER — Other Ambulatory Visit: Payer: Self-pay

## 2020-09-17 DIAGNOSIS — M8589 Other specified disorders of bone density and structure, multiple sites: Secondary | ICD-10-CM | POA: Diagnosis not present

## 2020-09-17 DIAGNOSIS — M8580 Other specified disorders of bone density and structure, unspecified site: Secondary | ICD-10-CM

## 2020-09-17 DIAGNOSIS — Z780 Asymptomatic menopausal state: Secondary | ICD-10-CM | POA: Diagnosis not present

## 2020-10-31 DIAGNOSIS — E663 Overweight: Secondary | ICD-10-CM | POA: Diagnosis not present

## 2020-10-31 DIAGNOSIS — Z955 Presence of coronary angioplasty implant and graft: Secondary | ICD-10-CM | POA: Diagnosis not present

## 2020-10-31 DIAGNOSIS — I480 Paroxysmal atrial fibrillation: Secondary | ICD-10-CM | POA: Diagnosis not present

## 2020-10-31 DIAGNOSIS — K219 Gastro-esophageal reflux disease without esophagitis: Secondary | ICD-10-CM | POA: Diagnosis not present

## 2020-10-31 DIAGNOSIS — E7800 Pure hypercholesterolemia, unspecified: Secondary | ICD-10-CM | POA: Diagnosis not present

## 2020-10-31 DIAGNOSIS — R7303 Prediabetes: Secondary | ICD-10-CM | POA: Diagnosis not present

## 2020-10-31 DIAGNOSIS — R69 Illness, unspecified: Secondary | ICD-10-CM | POA: Diagnosis not present

## 2020-10-31 DIAGNOSIS — Z87891 Personal history of nicotine dependence: Secondary | ICD-10-CM | POA: Diagnosis not present

## 2020-10-31 DIAGNOSIS — I10 Essential (primary) hypertension: Secondary | ICD-10-CM | POA: Diagnosis not present

## 2021-02-12 DIAGNOSIS — I4891 Unspecified atrial fibrillation: Secondary | ICD-10-CM | POA: Diagnosis not present

## 2021-02-12 DIAGNOSIS — I10 Essential (primary) hypertension: Secondary | ICD-10-CM | POA: Diagnosis not present

## 2021-02-12 DIAGNOSIS — D6869 Other thrombophilia: Secondary | ICD-10-CM | POA: Diagnosis not present

## 2021-02-12 DIAGNOSIS — Z87891 Personal history of nicotine dependence: Secondary | ICD-10-CM | POA: Diagnosis not present

## 2021-02-12 DIAGNOSIS — Z8249 Family history of ischemic heart disease and other diseases of the circulatory system: Secondary | ICD-10-CM | POA: Diagnosis not present

## 2021-02-12 DIAGNOSIS — I2510 Atherosclerotic heart disease of native coronary artery without angina pectoris: Secondary | ICD-10-CM | POA: Diagnosis not present

## 2021-02-12 DIAGNOSIS — Z7901 Long term (current) use of anticoagulants: Secondary | ICD-10-CM | POA: Diagnosis not present

## 2021-02-12 DIAGNOSIS — Z7722 Contact with and (suspected) exposure to environmental tobacco smoke (acute) (chronic): Secondary | ICD-10-CM | POA: Diagnosis not present

## 2021-02-12 DIAGNOSIS — E785 Hyperlipidemia, unspecified: Secondary | ICD-10-CM | POA: Diagnosis not present

## 2021-02-12 DIAGNOSIS — Z008 Encounter for other general examination: Secondary | ICD-10-CM | POA: Diagnosis not present

## 2021-02-18 DIAGNOSIS — J3489 Other specified disorders of nose and nasal sinuses: Secondary | ICD-10-CM | POA: Diagnosis not present

## 2021-02-18 DIAGNOSIS — J45909 Unspecified asthma, uncomplicated: Secondary | ICD-10-CM | POA: Diagnosis not present

## 2021-02-19 DIAGNOSIS — J3489 Other specified disorders of nose and nasal sinuses: Secondary | ICD-10-CM | POA: Diagnosis not present

## 2021-02-19 DIAGNOSIS — R059 Cough, unspecified: Secondary | ICD-10-CM | POA: Diagnosis not present

## 2021-02-19 DIAGNOSIS — Z03818 Encounter for observation for suspected exposure to other biological agents ruled out: Secondary | ICD-10-CM | POA: Diagnosis not present

## 2021-04-29 DIAGNOSIS — I10 Essential (primary) hypertension: Secondary | ICD-10-CM | POA: Diagnosis not present

## 2021-04-29 DIAGNOSIS — I480 Paroxysmal atrial fibrillation: Secondary | ICD-10-CM | POA: Diagnosis not present

## 2021-04-29 DIAGNOSIS — E7800 Pure hypercholesterolemia, unspecified: Secondary | ICD-10-CM | POA: Diagnosis not present

## 2021-04-29 DIAGNOSIS — Z23 Encounter for immunization: Secondary | ICD-10-CM | POA: Diagnosis not present

## 2021-04-29 DIAGNOSIS — R7303 Prediabetes: Secondary | ICD-10-CM | POA: Diagnosis not present

## 2021-04-29 DIAGNOSIS — Z1211 Encounter for screening for malignant neoplasm of colon: Secondary | ICD-10-CM | POA: Diagnosis not present

## 2021-04-29 DIAGNOSIS — Z0000 Encounter for general adult medical examination without abnormal findings: Secondary | ICD-10-CM | POA: Diagnosis not present

## 2021-04-29 DIAGNOSIS — R69 Illness, unspecified: Secondary | ICD-10-CM | POA: Diagnosis not present

## 2021-05-27 ENCOUNTER — Other Ambulatory Visit: Payer: Self-pay

## 2021-05-27 MED ORDER — LISINOPRIL-HYDROCHLOROTHIAZIDE 20-12.5 MG PO TABS: 2.0000 | ORAL_TABLET | Freq: Every day | ORAL | 0 refills | Status: DC

## 2021-08-23 ENCOUNTER — Other Ambulatory Visit: Payer: Self-pay

## 2021-08-23 MED ORDER — LISINOPRIL-HYDROCHLOROTHIAZIDE 20-12.5 MG PO TABS: 2.0000 | ORAL_TABLET | Freq: Every day | ORAL | 0 refills | Status: DC

## 2021-09-30 ENCOUNTER — Other Ambulatory Visit: Payer: Self-pay

## 2021-09-30 MED ORDER — LISINOPRIL-HYDROCHLOROTHIAZIDE 20-12.5 MG PO TABS: 2.0000 | ORAL_TABLET | Freq: Every day | ORAL | 0 refills | Status: AC

## 2021-10-29 DIAGNOSIS — E7800 Pure hypercholesterolemia, unspecified: Secondary | ICD-10-CM | POA: Diagnosis not present

## 2021-10-29 DIAGNOSIS — E6609 Other obesity due to excess calories: Secondary | ICD-10-CM | POA: Diagnosis not present

## 2021-10-29 DIAGNOSIS — I480 Paroxysmal atrial fibrillation: Secondary | ICD-10-CM | POA: Diagnosis not present

## 2021-10-29 DIAGNOSIS — I10 Essential (primary) hypertension: Secondary | ICD-10-CM | POA: Diagnosis not present

## 2021-10-29 DIAGNOSIS — R7303 Prediabetes: Secondary | ICD-10-CM | POA: Diagnosis not present

## 2021-10-29 DIAGNOSIS — R69 Illness, unspecified: Secondary | ICD-10-CM | POA: Diagnosis not present

## 2021-10-29 DIAGNOSIS — Z6830 Body mass index (BMI) 30.0-30.9, adult: Secondary | ICD-10-CM | POA: Diagnosis not present

## 2021-10-31 ENCOUNTER — Other Ambulatory Visit: Payer: Self-pay | Admitting: *Deleted

## 2021-11-04 ENCOUNTER — Other Ambulatory Visit: Payer: Self-pay | Admitting: *Deleted

## 2022-01-03 DIAGNOSIS — L209 Atopic dermatitis, unspecified: Secondary | ICD-10-CM | POA: Diagnosis not present

## 2022-04-30 ENCOUNTER — Encounter (HOSPITAL_BASED_OUTPATIENT_CLINIC_OR_DEPARTMENT_OTHER): Payer: Self-pay

## 2022-04-30 ENCOUNTER — Other Ambulatory Visit: Payer: Self-pay | Admitting: Family Medicine

## 2022-04-30 ENCOUNTER — Emergency Department (HOSPITAL_BASED_OUTPATIENT_CLINIC_OR_DEPARTMENT_OTHER)
Admission: EM | Admit: 2022-04-30 | Discharge: 2022-04-30 | Disposition: A | Discharge status: Home / Self Care | Payer: Medicare Other | Attending: Emergency Medicine | Admitting: Emergency Medicine

## 2022-04-30 ENCOUNTER — Other Ambulatory Visit: Payer: Self-pay

## 2022-04-30 ENCOUNTER — Other Ambulatory Visit (HOSPITAL_BASED_OUTPATIENT_CLINIC_OR_DEPARTMENT_OTHER): Payer: Self-pay

## 2022-04-30 DIAGNOSIS — E876 Hypokalemia: Secondary | ICD-10-CM | POA: Diagnosis not present

## 2022-04-30 DIAGNOSIS — Z7901 Long term (current) use of anticoagulants: Secondary | ICD-10-CM | POA: Insufficient documentation

## 2022-04-30 DIAGNOSIS — I4891 Unspecified atrial fibrillation: Secondary | ICD-10-CM | POA: Insufficient documentation

## 2022-04-30 DIAGNOSIS — R000 Tachycardia, unspecified: Secondary | ICD-10-CM | POA: Diagnosis not present

## 2022-04-30 DIAGNOSIS — I2510 Atherosclerotic heart disease of native coronary artery without angina pectoris: Secondary | ICD-10-CM | POA: Diagnosis not present

## 2022-04-30 DIAGNOSIS — I10 Essential (primary) hypertension: Secondary | ICD-10-CM | POA: Diagnosis not present

## 2022-04-30 DIAGNOSIS — Z1231 Encounter for screening mammogram for malignant neoplasm of breast: Secondary | ICD-10-CM

## 2022-04-30 DIAGNOSIS — I9589 Other hypotension: Secondary | ICD-10-CM | POA: Diagnosis not present

## 2022-04-30 DIAGNOSIS — Z79899 Other long term (current) drug therapy: Secondary | ICD-10-CM | POA: Insufficient documentation

## 2022-04-30 DIAGNOSIS — R5383 Other fatigue: Secondary | ICD-10-CM | POA: Diagnosis not present

## 2022-04-30 DIAGNOSIS — Z0000 Encounter for general adult medical examination without abnormal findings: Secondary | ICD-10-CM | POA: Diagnosis not present

## 2022-04-30 LAB — CBC WITH DIFFERENTIAL/PLATELET
Abs Immature Granulocytes: 0.13 10*3/uL — ABNORMAL HIGH (ref 0.00–0.07)
Basophils Absolute: 0.1 10*3/uL (ref 0.0–0.1)
Basophils Relative: 1 %
Eosinophils Absolute: 0.2 10*3/uL (ref 0.0–0.5)
Eosinophils Relative: 1 %
HCT: 49 % — ABNORMAL HIGH (ref 36.0–46.0)
Hemoglobin: 17.2 g/dL — ABNORMAL HIGH (ref 12.0–15.0)
Immature Granulocytes: 1 %
Lymphocytes Relative: 28 %
Lymphs Abs: 3.5 10*3/uL (ref 0.7–4.0)
MCH: 31.3 pg (ref 26.0–34.0)
MCHC: 35.1 g/dL (ref 30.0–36.0)
MCV: 89.1 fL (ref 80.0–100.0)
Monocytes Absolute: 1 10*3/uL (ref 0.1–1.0)
Monocytes Relative: 8 %
Neutro Abs: 7.5 10*3/uL (ref 1.7–7.7)
Neutrophils Relative %: 61 %
Platelets: 297 10*3/uL (ref 150–400)
RBC: 5.5 MIL/uL — ABNORMAL HIGH (ref 3.87–5.11)
RDW: 12.4 % (ref 11.5–15.5)
WBC: 12.3 10*3/uL — ABNORMAL HIGH (ref 4.0–10.5)
nRBC: 0 % (ref 0.0–0.2)

## 2022-04-30 LAB — BASIC METABOLIC PANEL
Anion gap: 11 (ref 5–15)
BUN: 22 mg/dL (ref 8–23)
CO2: 26 mmol/L (ref 22–32)
Calcium: 10.4 mg/dL — ABNORMAL HIGH (ref 8.9–10.3)
Chloride: 105 mmol/L (ref 98–111)
Creatinine, Ser: 1.15 mg/dL — ABNORMAL HIGH (ref 0.44–1.00)
GFR, Estimated: 52 mL/min — ABNORMAL LOW (ref >60)
Glucose, Bld: 122 mg/dL — ABNORMAL HIGH (ref 70–99)
Potassium: 3.2 mmol/L — ABNORMAL LOW (ref 3.5–5.1)
Sodium: 142 mmol/L (ref 135–145)

## 2022-04-30 LAB — MAGNESIUM: Magnesium: 1.8 mg/dL (ref 1.7–2.4)

## 2022-04-30 MED ORDER — SODIUM CHLORIDE 0.9 % IV BOLUS
1000.0000 mL | Freq: Once | INTRAVENOUS | Status: AC
  Administered 2022-04-30: 1000 mL via INTRAVENOUS

## 2022-04-30 MED ORDER — LABETALOL HCL 5 MG/ML IV SOLN
5.0000 mg | Freq: Once | INTRAVENOUS | Status: AC
  Administered 2022-04-30: 5 mg via INTRAVENOUS
  Filled 2022-04-30: qty 4

## 2022-04-30 MED ORDER — POTASSIUM CHLORIDE CRYS ER 20 MEQ PO TBCR
40.0000 meq | EXTENDED_RELEASE_TABLET | Freq: Once | ORAL | Status: AC
  Administered 2022-04-30: 40 meq via ORAL
  Filled 2022-04-30: qty 2

## 2022-04-30 NOTE — ED Notes (Signed)
Pt's hr has been between 70's to 115, no pain

## 2022-04-30 NOTE — ED Triage Notes (Signed)
She tells me she has had a-fib. For ~ 2 years and is on Eliquis for same. She is here today withc/o "rapid heart rate for the past 9 days, and the doctor gave me Metoprolol". She denies any pain or discomfort. She also denies shortness of breath and tells me she is able to sleep in spite of the rapid heart rate.

## 2022-04-30 NOTE — ED Provider Notes (Signed)
[facility] Provider Note   CSN: [id number] Arrival date & time: 04/30/22  1007     History  Chief Complaint  Patient presents with   a-fib with rvr    Kari Harrison is a 67 y.o. female.  HPI   67 year old female with past medical history of atrial fibrillation on Eliquis, CAD, HTN, HLD presents to the emergency department with concern for ongoing atrial fibrillation.  Patient states that she has been in A-fib it feels like for the past week.  She feels fatigued but denies any chest pain, shortness of breath, swelling of her lower extremities.  She been compliant with her medications, specifically Eliquis.  She been prescribed metoprolol to take in addition to her medications for fast heart rate/palpitations which she has not been taking.  Denies any recent illness, fever.  Home Medications Prior to Admission medications   Medication Sig Start Date End Date Taking? Authorizing Provider  apixaban (ELIQUIS) 5 MG TABS tablet Take 1 tablet (5 mg total) by mouth 2 (two) times daily. 02/22/19 04/30/22 Yes Donne Hazel, MD  Ascorbic Acid (VITAMIN C) 1000 MG tablet Take 1,000 mg by mouth daily.   Yes [provider]  atorvastatin (LIPITOR) 80 MG tablet take 1 tablet by mouth once daily ; MAKE DOCTOR'S APPOINTMENT ! 01/28/16  Yes Larey Dresser, MD  cholecalciferol (VITAMIN D3) 25 MCG (1000 UNIT) tablet Take 1,000 Units by mouth daily.   Yes [provider]  lisinopril-hydrochlorothiazide (ZESTORETIC) 20-12.5 MG tablet Take 2 tablets by mouth daily. Please make overdue appt with Dr. Johney Frame before anymore refills. Thank you 3rd and Final attempt 09/30/21  Yes Freada Bergeron, MD  metoprolol succinate (TOPROL-XL) 50 MG 24 hr tablet Take 50 mg by mouth daily. 02/03/22  Yes [provider]  metoprolol tartrate (LOPRESSOR) 25 MG tablet Take 25 mg by mouth See admin instructions. Once daily with food as needed for  pulse over 100   Yes [provider]  Multiple Minerals (CALCIUM/MAGNESIUM/ZINC PO) Take 1 tablet by mouth daily.   Yes [provider]  venlafaxine XR (EFFEXOR-XR) 75 MG 24 hr capsule Take 75 mg by mouth daily. 06/12/14  Yes [provider]      Allergies    Patient has no known allergies.    Review of Systems   Review of Systems  Constitutional:  Positive for fatigue. Negative for fever.  Respiratory:  Negative for chest tightness and shortness of breath.   Cardiovascular:  Positive for palpitations. Negative for chest pain and leg swelling.  Gastrointestinal:  Negative for abdominal pain, diarrhea and vomiting.  Skin:  Negative for rash.  Neurological:  Negative for headaches.    Physical Exam Updated Vital Signs BP 111/82   Pulse 81   Temp 98 F (36.7 C) (Oral)   Resp (!) 23   SpO2 96%  Physical Exam Vitals and nursing note reviewed.  Constitutional:      General: She is not in acute distress.    Appearance: Normal appearance.  HENT:     Head: Normocephalic.     Mouth/Throat:     Mouth: Mucous membranes are moist.  Cardiovascular:     Rate and Rhythm: Normal rate. Rhythm irregular.  Pulmonary:     Effort: Pulmonary effort is normal. No respiratory distress.  Abdominal:     Palpations: Abdomen is soft.     Tenderness: There is no abdominal tenderness.  Skin:    General: Skin is  warm.  Neurological:     Mental Status: She is alert and oriented to person, place, and time. Mental status is at baseline.  Psychiatric:        Mood and Affect: Mood normal.     ED Results / Procedures / Treatments   Labs (all labs ordered are listed, but only abnormal results are displayed) Labs Reviewed  CBC WITH DIFFERENTIAL/PLATELET - Abnormal; Notable for the following components:      Result Value   WBC 12.3 (*)    RBC 5.50 (*)    Hemoglobin 17.2 (*)    HCT 49.0 (*)    Abs Immature Granulocytes 0.13 (*)    All other components within normal  limits  BASIC METABOLIC PANEL - Abnormal; Notable for the following components:   Potassium 3.2 (*)    Glucose, Bld 122 (*)    Creatinine, Ser 1.15 (*)    Calcium 10.4 (*)    GFR, Estimated 52 (*)    All other components within normal limits  MAGNESIUM    EKG EKG Interpretation  Date/Time:  Wednesday April 30 2022 10:23:36 EST Ventricular Rate:  89 PR Interval:    QRS Duration: 82 QT Interval:  333 QTC Calculation: 406 R Axis:   -22 Text Interpretation: Atrial fibrillation Ventricular premature complex Probable LVH with secondary repol abnrm Confirmed by Lavenia Atlas [phone]) on 04/30/2022 10:59:45 AM  Radiology No results found.  Procedures Procedures    Medications Ordered in ED Medications  potassium chloride SA (KLOR-CON M) CR tablet 40 mEq (has no administration in time range)  sodium chloride 0.9 % bolus 1,000 mL (has no administration in time range)  labetalol (NORMODYNE) injection 5 mg (5 mg Intravenous Given 04/30/22 1136)    ED Course/ Medical Decision Making/ A&P                             Medical Decision Making Amount and/or Complexity of Data Reviewed Labs: ordered.  Risk Prescription drug management.   67 year old female presents to the emergency department with atrial fibrillation.  Patient notes that she feels like she has been in atrial fibrillation for about the past week.  Endorses fatigue but denies any chest pain, shortness of breath, swelling of her lower extremities.  No recent fever or illness.  She has been prescribed additional metoprolol to take when her heart rate goes over 100 but she has not taken that to date.  On arrival EKG shows atrial fibrillation.  Blood work shows mild hypokalemia and a creatinine of 1.15.  CBC shows a hemoglobin of 17, possible hemoconcentration dehydration.  Magnesium is normal.  Patient was given a dose of beta-blocker here in the department.  Since being here her heart rate has remained controlled  between 80-100, BP stable.  Her potassium was replaced and she was given a small fluid bolus.  No chest pain, no findings of CHF, she is compliant with her anticoagulation and low suspicion for PE.  Lung sounds were equal.  Educated the patient about usage of metoprolol for elevated heart rate.  I will be referring her to the atrial fibrillation clinic for further evaluation and treatment.  No indication for emergent cardioversion at this time.  Patient at this time appears safe and stable for discharge and close outpatient follow up. Discharge plan and strict return to ED precautions discussed, patient verbalizes understanding and agreement.        Final Clinical Impression(s) / ED  Diagnoses Final diagnoses:  None    Rx / DC Orders ED Discharge Orders     None         Lorelle Gibbs, DO 04/30/22 1354

## 2022-04-30 NOTE — Discharge Instructions (Addendum)
You have been seen and discharged from the emergency department.  Your blood work showed mild low potassium which was replaced here in the department as well as mild dehydration, you are given IV fluids.  Your heart rate remained appropriate while here in the department.  Call today to make an appointment with atrial fibrillation clinic so they can monitor your heart rate and give further recommendations and definitive treatment.  Follow-up with your primary provider for further evaluation and further care. Take home medications as prescribed. If you have any worsening symptoms or further concerns for your health please return to an emergency department for further evaluation.

## 2022-05-01 ENCOUNTER — Ambulatory Visit (HOSPITAL_COMMUNITY)
Admission: RE | Admit: 2022-05-01 | Discharge: 2022-05-01 | Disposition: A | Discharge status: Home / Self Care | Payer: Medicare Other | Source: Ambulatory Visit | Attending: Nurse Practitioner | Admitting: Nurse Practitioner

## 2022-05-01 VITALS — BP 112/80 | HR 71 | Ht 61.0 in | Wt 153.8 lb

## 2022-05-01 DIAGNOSIS — F17210 Nicotine dependence, cigarettes, uncomplicated: Secondary | ICD-10-CM | POA: Diagnosis not present

## 2022-05-01 DIAGNOSIS — E669 Obesity, unspecified: Secondary | ICD-10-CM | POA: Insufficient documentation

## 2022-05-01 DIAGNOSIS — Z8249 Family history of ischemic heart disease and other diseases of the circulatory system: Secondary | ICD-10-CM | POA: Insufficient documentation

## 2022-05-01 DIAGNOSIS — I2510 Atherosclerotic heart disease of native coronary artery without angina pectoris: Secondary | ICD-10-CM | POA: Diagnosis not present

## 2022-05-01 DIAGNOSIS — G4733 Obstructive sleep apnea (adult) (pediatric): Secondary | ICD-10-CM | POA: Diagnosis not present

## 2022-05-01 DIAGNOSIS — Z79899 Other long term (current) drug therapy: Secondary | ICD-10-CM | POA: Insufficient documentation

## 2022-05-01 DIAGNOSIS — Z6831 Body mass index (BMI) 31.0-31.9, adult: Secondary | ICD-10-CM | POA: Insufficient documentation

## 2022-05-01 DIAGNOSIS — I10 Essential (primary) hypertension: Secondary | ICD-10-CM | POA: Diagnosis not present

## 2022-05-01 DIAGNOSIS — D6869 Other thrombophilia: Secondary | ICD-10-CM

## 2022-05-01 DIAGNOSIS — E785 Hyperlipidemia, unspecified: Secondary | ICD-10-CM | POA: Diagnosis not present

## 2022-05-01 DIAGNOSIS — I480 Paroxysmal atrial fibrillation: Secondary | ICD-10-CM

## 2022-05-01 NOTE — Progress Notes (Addendum)
Primary Care Physician: [name], MD Primary Cardiologist: Dr McLean/Dr Acie Fredrickson Primary Electrophysiologist: none Referring Physician: Dr Gust Rung is a 67 y.o. female with a history of hypertension, hyperlipidemia, coronary artery disease, tobacco abuse and paroxysmal atrial fibrillation who presents for follow up in the [facility].  The patient was initially diagnosed with atrial fibrillation 02/20/19 after presenting to the ER with symptoms of tachy palpitations and was found to be in afib with RVR. She was admitted and rate controlled on diltiazem drip. She was started on Eliquis for a CHADS2VASC score of 3. A TEE/DCCV was planned but she converted to SR on her own. She denies any alcohol use.    F/u in afib clinic, 05/01/22. She was seen in the ED yesterday for ongoing afib x one week. She was being seen at PCP office and had afib with RVR and soft BP. She was directed to the ED. The pt was found to mildly dehydrated and with a K+ of 3.2. She received fluids and K+ was repleted. She left the hospital  in afib but woke up this am SR and remains in SR for this visit. She has seen an increase in afib burden. Will usually go back into rhythm in 2 days. This last episode has been the longest she has had. She cannot use 1c agents for CAD.    Today, she denies symptoms of chest pain, shortness of breath, orthopnea, PND, lower extremity edema, dizziness, presyncope, syncope, bleeding, or neurologic sequela. The patient is tolerating medications without difficulties and is otherwise without complaint today.    Atrial Fibrillation Risk Factors:  she does have symptoms or diagnosis of sleep apnea. she does not have a history of rheumatic fever. she does not have a history of alcohol use. The patient does not have a history of early familial atrial fibrillation or other arrhythmias.  she has a BMI of Body mass index is 31.9 kg/m.Marland Kitchen Filed Weights    07/08/19 0937  Weight: 77.8 kg    Family History  Problem Relation Age of Onset   Arthritis Mother    Macular degeneration Mother    Heart attack Father    Hypertension Sister    Atrial fibrillation Neg Hx      Atrial Fibrillation Management history:  Previous antiarrhythmic drugs: none Previous cardioversions: none Previous ablations: none CHADS2VASC score: 3 Anticoagulation history: Eliquis   Past Medical History:  Diagnosis Date   CAD (coronary artery disease)    a. Cardiac CTA: severe 3VD;  b. 05/2014 Cath/PCI: LM nl, LAD 50-60p/m, 19m, D1 50ost/m, RI small/plaque, LCX dominant 20-30p, 39m, 60/90d (2.25x24 Promus DES), OM2 40-15m, RCA nondom, mod diff plaque, EF 55%.   Hyperlipidemia    Hypertension    Pulmonary nodules    a. 05/2014 CTA: left lung pulm nodules w/ rec for 3 month f/u noncontrast CT.   Tobacco abuse    Past Surgical History:  Procedure Laterality Date   CESAREAN SECTION     LEFT HEART CATHETERIZATION WITH CORONARY ANGIOGRAM N/A 06/16/2014   Procedure: LEFT HEART CATHETERIZATION WITH CORONARY ANGIOGRAM;  Surgeon: Jolaine Artist, MD;  Location: [facility] CATH LAB;  Service: Cardiovascular;  Laterality: N/A;    Current Outpatient Medications  Medication Sig Dispense Refill   apixaban (ELIQUIS) 5 MG TABS tablet Take 1 tablet (5 mg total) by mouth 2 (two) times daily. 60 tablet 0   Ascorbic Acid (VITAMIN C) 100 MG tablet Take 100 mg by  mouth daily.     aspirin 81 MG tablet Take 1 tablet (81 mg total) by mouth daily. 30 tablet    atenolol (TENORMIN) 50 MG tablet Take 50 mg by mouth daily.  0   atorvastatin (LIPITOR) 80 MG tablet take 1 tablet by mouth once daily ; MAKE DOCTOR'S APPOINTMENT ! 15 tablet 0   CALCIUM PO Take 1 tablet by mouth daily.     cholecalciferol (VITAMIN D3) 25 MCG (1000 UT) tablet Take 1,000 Units by mouth daily.     lisinopril-hydrochlorothiazide (PRINZIDE,ZESTORETIC) 20-12.5 MG per tablet Take 2 tablets by mouth daily.  0   loratadine  (CLARITIN) 10 MG tablet Take 10 mg by mouth daily.     Multiple Vitamins-Minerals (ZINC PO) Take 1 tablet by mouth daily.     omeprazole (PRILOSEC) 20 MG capsule Take 20 mg by mouth daily.     venlafaxine XR (EFFEXOR-XR) 75 MG 24 hr capsule Take 75 mg by mouth daily.  0   No current facility-administered medications for this encounter.    No Known Allergies  Social History   Socioeconomic History   Marital status: Married    Spouse name: Not on file   Number of children: Not on file   Years of education: Not on file   Highest education level: Not on file  Occupational History   Occupation: Chartered certified accountant  Tobacco Use   Smoking status: Current Every Day Smoker    Packs/day: 0.02    Years: 24.00    Pack years: 0.48    Types: Cigarettes   Smokeless tobacco: Never Used   Tobacco comment: denies need for a patch/ less than 6 a day  Substance and Sexual Activity   Alcohol use: No    Alcohol/week: 0.0 standard drinks   Drug use: Not Currently    Types: Marijuana    Comment: occasional use   Sexual activity: Not on file  Other Topics Concern   Not on file  Social History Narrative   Not on file   Social Determinants of Health   Financial Resource Strain:    Difficulty of Paying Living Expenses:   Food Insecurity:    Worried About Charity fundraiser in the Last Year:    Arboriculturist in the Last Year:   Transportation Needs:    Film/video editor (Medical):    Lack of Transportation (Non-Medical):   Physical Activity:    Days of Exercise per Week:    Minutes of Exercise per Session:   Stress:    Feeling of Stress :   Social Connections:    Frequency of Communication with Friends and Family:    Frequency of Social Gatherings with Friends and Family:    Attends Religious Services:    Active Member of Clubs or Organizations:    Attends Music therapist:    Marital Status:   Intimate Partner Violence:    Fear of Current or Ex-Partner:     Emotionally Abused:    Physically Abused:    Sexually Abused:      ROS- All systems are reviewed and negative except as per the HPI above.  Physical Exam: Vitals:   07/08/19 0937  BP: 128/82  Pulse: (!) 56  Weight: 77.8 kg  Height: 5' 1.5" (1.562 m)    GEN- The patient is well appearing obese female, alert and oriented x 3 today.   HEENT-head normocephalic, atraumatic, sclera clear, conjunctiva pink, hearing intact, trachea midline. Lungs- Clear to ausculation  bilaterally, normal work of breathing Heart- Regular rate and rhythm, no murmurs, rubs or gallops  GI- soft, NT, ND, + BS Extremities- no clubbing, cyanosis, or edema MS- no significant deformity or atrophy Skin- no rash or lesion Psych- euthymic mood, full affect Neuro- strength and sensation are intact   Wt Readings from Last 3 Encounters:  07/08/19 77.8 kg  06/25/19 78.5 kg  05/03/19 78.5 kg    EKG today demonstrates SB HR 56, NST, PR 168, QRS 74, QTc 391  Echo 02/20/19 demonstrated  1. Left ventricular ejection fraction, by visual estimation, is 60 to 65%. The left ventricle has normal function. There is mildly increased left ventricular hypertrophy.  2. Left ventricular diastolic parameters are indeterminate.  3. Global right ventricle has normal systolic function.The right ventricular size is normal. No increase in right ventricular wall thickness.  4. Left atrial size was normal.  5. Right atrial size was normal.  6. Moderate aortic valve annular calcification.  7. The mitral valve is normal in structure. No evidence of mitral valve regurgitation. No evidence of mitral stenosis.  8. The tricuspid valve is normal in structure. Tricuspid valve regurgitation is not demonstrated.  9. The aortic valve was not well visualized. Aortic valve regurgitation is not visualized. No evidence of aortic valve sclerosis or stenosis. 10. There is Moderate calcification of the aortic valve. 11. There is Moderate thickening  of the aortic valve. 12. The pulmonic valve was not well visualized. Pulmonic valve regurgitation is not visualized. 13. The inferior vena cava is normal in size with greater than 50% respiratory variability, suggesting right atrial pressure of 3 mmHg.    Epic records are reviewed at length today  Assessment and Plan:  1. Paroxysmal atrial fibrillation Has had an increased afib burden Have to avoid 1C agents for known CAD  We discussed use of Multaq and being referred to EP for afib ablation  Sometimes the cost of Multaq could be cost prohibitive for the Medicare population She will check on price of drug She is also interested in ablation but wants to discuss with her PCP on Monday She will need a baseline liver function panel prior to start of Multaq I would reduce the  BB dose on start by 1/2  and repeat EKG in one week on start of Multaq  Continue metoprolol at 50 mg daily  Continue Eliquis 5 mg BID  This patients CHA2DS2-VASc Score and unadjusted Ischemic Stroke Rate (% per year) is equal to 3.2 % stroke rate/year from a score of 3  Above score calculated as 1 point each if present [CHF, HTN, DM, Vascular=MI/PAD/Aortic Plaque, Age if 65-74, or Female] Above score calculated as 2 points each if present [Age > 75, or Stroke/TIA/TE]  2. Obesity Body mass index is 31.9 kg/m. Lifestyle modification was discussed and encouraged including regular physical activity and weight reduction.  3. OSA The importance of adequate treatment of sleep apnea was discussed today in order to improve our ability to maintain sinus rhythm long term.  4. CAD S/p PCI in 2016. Low risk myoview 04/08/19 No anginal symptoms.  5. HTN Soft today With recent dehydration and hypokalemia, question if she needs 2 tabs of lisinopril/hctz daily, this may have contributed to recent afib episode  She will discuss with PCP May need repeat bmet with f/u with PCP   I will wait to hear pt's decision of treatment  plan     Butch Penny C. Evellyn Tuff, [facility] [address]  [address] [place], [place] [address] [phone]

## 2022-05-05 ENCOUNTER — Other Ambulatory Visit (HOSPITAL_COMMUNITY): Payer: Self-pay | Admitting: *Deleted

## 2022-05-05 DIAGNOSIS — Z9989 Dependence on other enabling machines and devices: Secondary | ICD-10-CM | POA: Diagnosis not present

## 2022-05-05 DIAGNOSIS — I480 Paroxysmal atrial fibrillation: Secondary | ICD-10-CM | POA: Diagnosis not present

## 2022-05-05 DIAGNOSIS — R7303 Prediabetes: Secondary | ICD-10-CM | POA: Diagnosis not present

## 2022-05-05 DIAGNOSIS — D6869 Other thrombophilia: Secondary | ICD-10-CM | POA: Diagnosis not present

## 2022-05-05 DIAGNOSIS — I2510 Atherosclerotic heart disease of native coronary artery without angina pectoris: Secondary | ICD-10-CM | POA: Diagnosis not present

## 2022-05-05 MED ORDER — MULTAQ 400 MG PO TABS: 400.0000 mg | ORAL_TABLET | Freq: Two times a day (BID) | ORAL | 3 refills | Status: DC

## 2022-05-08 ENCOUNTER — Encounter (HOSPITAL_COMMUNITY): Payer: Self-pay | Admitting: *Deleted

## 2022-05-16 ENCOUNTER — Ambulatory Visit (HOSPITAL_COMMUNITY)
Admission: RE | Admit: 2022-05-16 | Discharge: 2022-05-16 | Disposition: A | Discharge status: Home / Self Care | Payer: Medicare Other | Source: Ambulatory Visit | Attending: Physician Assistant | Admitting: Physician Assistant

## 2022-05-16 VITALS — HR 61

## 2022-05-16 DIAGNOSIS — I480 Paroxysmal atrial fibrillation: Secondary | ICD-10-CM

## 2022-05-16 DIAGNOSIS — R9431 Abnormal electrocardiogram [ECG] [EKG]: Secondary | ICD-10-CM | POA: Diagnosis not present

## 2022-05-16 DIAGNOSIS — I4891 Unspecified atrial fibrillation: Secondary | ICD-10-CM | POA: Diagnosis not present

## 2022-05-16 NOTE — Progress Notes (Signed)
Patient returns for ECG after starting Multaq. ECG shows:  SR, NST Vent. rate 61 BPM PR interval 166 ms QRS duration 78 ms QT/QTcB 440/442 ms  Patient did have one episodes of afib which lasted for about 6 hours but her rates were slower and her symptoms were much more mild. Will refer to establish care with EP and discuss ablation.

## 2022-05-22 ENCOUNTER — Encounter: Payer: Self-pay | Admitting: Cardiology

## 2022-05-22 ENCOUNTER — Ambulatory Visit: Payer: Medicare Other | Attending: Cardiology | Admitting: Cardiology

## 2022-05-22 VITALS — BP 110/64 | HR 64 | Ht 61.0 in | Wt 157.0 lb

## 2022-05-22 DIAGNOSIS — I480 Paroxysmal atrial fibrillation: Secondary | ICD-10-CM

## 2022-05-22 DIAGNOSIS — I2510 Atherosclerotic heart disease of native coronary artery without angina pectoris: Secondary | ICD-10-CM

## 2022-05-22 DIAGNOSIS — D6869 Other thrombophilia: Secondary | ICD-10-CM | POA: Diagnosis not present

## 2022-05-22 DIAGNOSIS — I10 Essential (primary) hypertension: Secondary | ICD-10-CM

## 2022-05-22 NOTE — Patient Instructions (Signed)
Medication Instructions:  Your physician recommends that you continue on your current medications as directed. Please refer to the Current Medication list given to you today.  *If you need a refill on your cardiac medications before your next appointment, please call your pharmacy*   Lab Work: Pre procedure labs -- see procedure instruction letter:  BMP & CBC  If you have labs (blood work) drawn today and your tests are completely normal, you will receive your results only by: [facility] (if you have MyChart) OR A paper copy in the mail If you have any lab test that is abnormal or we need to change your treatment, we will call you to review the results.   Testing/Procedures: Your physician has requested that you have cardiac CT within 7 days PRIOR to your ablation. Cardiac computed tomography (CT) is a painless test that uses an x-ray machine to take clear, detailed pictures of your heart.  Please follow instruction below located under "other instructions". You will get a call from our office to schedule the date for this test.  Your physician has recommended that you have an ablation. Catheter ablation is a medical procedure used to treat some cardiac arrhythmias (irregular heartbeats). During catheter ablation, a long, thin, flexible tube is put into a blood vessel in your groin (upper thigh), or neck. This tube is called an ablation catheter. It is then guided to your heart through the blood vessel. Radio frequency waves destroy small areas of heart tissue where abnormal heartbeats may cause an arrhythmia to start.   The EP scheduler will call you to arrange this procedure for sometime this summer.   Follow-Up: At [facility], you and your health needs are our priority.  As part of our continuing mission to provide you with exceptional heart care, we have created designated Provider Care Teams.  These Care Teams include your primary Cardiologist (physician) and Advanced Practice  Providers (APPs -  Physician Assistants and Nurse Practitioners) who all work together to provide you with the care you need, when you need it.  We recommend signing up for the patient portal called "MyChart".  Sign up information is provided on this After Visit Summary.  MyChart is used to connect with patients for Virtual Visits (Telemedicine).  Patients are able to view lab/test results, encounter notes, upcoming appointments, etc.  Non-urgent messages can be sent to your provider as well.   To learn more about what you can do with MyChart, go to [email or web address].    Your next appointment:   1 month(s) after your ablation  The format for your next appointment:   In Person  Provider:   AFib clinic   Thank you for choosing [facility]!!   Kari Curet, RN [phone]    Other Instructions   Cardiac Ablation Cardiac ablation is a procedure to destroy (ablate) some heart tissue that is sending bad signals. These bad signals cause problems in heart rhythm. The heart has many areas that make these signals. If there are problems in these areas, they can make the heart beat in a way that is not normal. Destroying some tissues can help make the heart rhythm normal. Tell your doctor about: Any allergies you have. All medicines you are taking. These include vitamins, herbs, eye drops, creams, and over-the-counter medicines. Any problems you or family members have had with medicines that make you fall asleep (anesthetics). Any blood disorders you have. Any surgeries you have had. Any medical conditions you have, such as kidney  failure. Whether you are pregnant or may be pregnant. What are the risks? This is a safe procedure. But problems may occur, including: Infection. Bruising and bleeding. Bleeding into the chest. Stroke or blood clots. Damage to nearby areas of your body. Allergies to medicines or dyes. The need for a pacemaker if the normal system is  damaged. Failure of the procedure to treat the problem. What happens before the procedure? Medicines Ask your doctor about: Changing or stopping your normal medicines. This is important. Taking aspirin and ibuprofen. Do not take these medicines unless your doctor tells you to take them. Taking other medicines, vitamins, herbs, and supplements. General instructions Follow instructions from your doctor about what you cannot eat or drink. Plan to have someone take you home from the hospital or clinic. If you will be going home right after the procedure, plan to have someone with you for 24 hours. Ask your doctor what steps will be taken to prevent infection. What happens during the procedure?  An IV tube will be put into one of your veins. You will be given a medicine to help you relax. The skin on your neck or groin will be numbed. A cut (incision) will be made in your neck or groin. A needle will be put through your cut and into a large vein. A tube (catheter) will be put into the needle. The tube will be moved to your heart. Dye may be put through the tube. This helps your doctor see your heart. Small devices (electrodes) on the tube will send out signals. A type of energy will be used to destroy some heart tissue. The tube will be taken out. Pressure will be held on your cut. This helps stop bleeding. A bandage will be put over your cut. The exact procedure may vary among doctors and hospitals. What happens after the procedure? You will be watched until you leave the hospital or clinic. This includes checking your heart rate, breathing rate, oxygen, and blood pressure. Your cut will be watched for bleeding. You will need to lie still for a few hours. Do not drive for 24 hours or as long as your doctor tells you. Summary Cardiac ablation is a procedure to destroy some heart tissue. This is done to treat heart rhythm problems. Tell your doctor about any medical conditions you may  have. Tell him or her about all medicines you are taking to treat them. This is a safe procedure. But problems may occur. These include infection, bruising, bleeding, and damage to nearby areas of your body. Follow what your doctor tells you about food and drink. You may also be told to change or stop some of your medicines. After the procedure, do not drive for 24 hours or as long as your doctor tells you. This information is not intended to replace advice given to you by your health care provider. Make sure you discuss any questions you have with your health care provider. Document Revised: 06/07/2021 Document Reviewed: 02/17/2019 Elsevier Patient Education  [place].

## 2022-05-22 NOTE — Progress Notes (Signed)
Electrophysiology Office Note   Date:  05/22/2022   ID:  Kari Harrison, Kari Harrison 10-05-1955, MRN [id number]  PCP:  [name], MD  Cardiologist:  Ammie Ferrier Primary Electrophysiologist:  Constance Haw, MD    Chief Complaint: AF   History of Present Illness: Kari Harrison is a 67 y.o. female who is being seen today for the evaluation of AF at the request of Fenton, Clint R, PA. Presenting today for electrophysiology evaluation.  She has a history significant for hypertension, hyperlipidemia, coronary disease, tobacco abuse, atrial fibrillation.  Atrial fibrillation was diagnosed in 2020 when she presented emergency room with palpitations.  She was started on rate control with diltiazem.  She converted to sinus rhythm without intervention.    She presented to A-fib clinic 05/01/2022 after being in atrial fibrillation for 1 week.  She did convert to sinus rhythm.  She usually convert to sinus rhythm after 2 days.  This was the longest episode she has had.  She is started Multaq.  Today, she denies symptoms of palpitations, chest pain, shortness of breath, orthopnea, PND, lower extremity edema, claudication, dizziness, presyncope, syncope, bleeding, or neurologic sequela. The patient is tolerating medications without difficulties.    Past Medical History:  Diagnosis Date   CAD (coronary artery disease)    a. Cardiac CTA: severe 3VD;  b. 05/2014 Cath/PCI: LM nl, LAD 50-60p/m, 33m D1 50ost/m, RI small/plaque, LCX dominant 20-30p, 571m60/90d (2.25x24 Promus DES), OM2 40-5060mCA nondom, mod diff plaque, EF 55%.   Hyperlipidemia    Hypertension    Pneumonia 03/01/2020   Pulmonary nodules    a. 05/2014 CTA: left lung pulm nodules w/ rec for 3 month f/u noncontrast CT.   Tobacco abuse    Past Surgical History:  Procedure Laterality Date   CESAREAN SECTION     LEFT HEART CATHETERIZATION WITH CORONARY ANGIOGRAM N/A 06/16/2014   Procedure: LEFT HEART CATHETERIZATION WITH  CORONARY ANGIOGRAM;  Surgeon: DanJolaine ArtistD;  [facility];  Service: Cardiovascular;  Laterality: N/A;     Current Outpatient Medications  Medication Sig Dispense Refill   albuterol (VENTOLIN HFA) 108 (90 Base) MCG/ACT inhaler Inhale 2 puffs into the lungs every 6 (six) hours as needed.     Ascorbic Acid (VITAMIN C) 1000 MG tablet Take 1,000 mg by mouth daily.     atorvastatin (LIPITOR) 80 MG tablet take 1 tablet by mouth once daily ; MAKE DOCTOR'S APPOINTMENT ! 15 tablet 0   cholecalciferol (VITAMIN D3) 25 MCG (1000 UNIT) tablet Take 1,000 Units by mouth daily.     dronedarone (MULTAQ) 400 MG tablet Take 1 tablet (400 mg total) by mouth 2 (two) times daily with a meal. 60 tablet 3   lisinopril-hydrochlorothiazide (ZESTORETIC) 20-12.5 MG tablet Take 2 tablets by mouth daily. Please make overdue appt with Dr. PemJohney Framefore anymore refills. Thank you 3rd and Final attempt 30 tablet 0   loratadine (CLARITIN) 10 MG tablet 1 tablet Orally Once a day,PRN     metoprolol succinate (TOPROL-XL) 50 MG 24 hr tablet Take 25 mg by mouth daily.     metoprolol tartrate (LOPRESSOR) 25 MG tablet Take 25 mg by mouth See admin instructions. Once daily with food as needed for pulse over 100     Multiple Minerals (CALCIUM/MAGNESIUM/ZINC PO) Take 1 tablet by mouth daily.     omeprazole (PRILOSEC) 20 MG capsule 1 capsule 30 minutes before morning meal Orally Once a day prn GERD  venlafaxine XR (EFFEXOR-XR) 75 MG 24 hr capsule Take 75 mg by mouth daily.  0   apixaban (ELIQUIS) 5 MG TABS tablet Take 1 tablet (5 mg total) by mouth 2 (two) times daily. 60 tablet 0   No current facility-administered medications for this visit.    Allergies:   Patient has no known allergies.   Social History:  The patient  reports that she has been smoking cigarettes. She has a 0.48 pack-year smoking history. She has never used smokeless tobacco. She reports that she does not currently use drugs after having used  the following drugs: Marijuana. She reports that she does not drink alcohol.   Family History:  The patient's family history includes Arthritis in her mother; Heart attack in her father; Hypertension in her sister; Macular degeneration in her mother.    ROS:  Please see the history of present illness.   Otherwise, review of systems is positive for none.   All other systems are reviewed and negative.    PHYSICAL EXAM: VS:  BP 110/64   Pulse 64   Ht 5' 1"$  (1.549 m)   Wt 157 lb (71.2 kg)   SpO2 98%   BMI 29.66 kg/m  , BMI Body mass index is 29.66 kg/m. GEN: Well nourished, well developed, in no acute distress  HEENT: normal  Neck: no JVD, carotid bruits, or masses Cardiac: RRR; no murmurs, rubs, or gallops,no edema  Respiratory:  clear to auscultation bilaterally, normal work of breathing GI: soft, nontender, nondistended, + BS MS: no deformity or atrophy  Skin: warm and dry Neuro:  Strength and sensation are intact Psych: euthymic mood, full affect  EKG:  EKG is not ordered today. Personal review of the ekg ordered 05/16/21 shows sinus rhythm  Recent Labs: 04/30/2022: BUN 22; Creatinine, Ser 1.15; Hemoglobin 17.2; Magnesium 1.8; Platelets 297; Potassium 3.2; Sodium 142    Lipid Panel     Component Value Date/Time   CHOL 71 03/01/2020 0305   CHOL 131 09/12/2019 0853   TRIG 237 (H) 03/01/2020 0305   HDL <10 (L) 03/01/2020 0305   HDL 42 09/12/2019 0853   CHOLHDL NOT CALCULATED 03/01/2020 0305   VLDL 47 (H) 03/01/2020 0305   LDLCALC NOT CALCULATED 03/01/2020 0305   LDLCALC 62 09/12/2019 0853     Wt Readings from Last 3 Encounters:  05/22/22 157 lb (71.2 kg)  05/01/22 153 lb 12.8 oz (69.8 kg)  08/23/20 152 lb 12.8 oz (69.3 kg)      Other studies Reviewed: Additional studies/ records that were reviewed today include: TTE 02/20/2019  Review of the above records today demonstrates:   1. Left ventricular ejection fraction, by visual estimation, is 60 to  65%. The left  ventricle has normal function. There is mildly increased  left ventricular hypertrophy.   2. Left ventricular diastolic parameters are indeterminate.   3. Global right ventricle has normal systolic function.The right  ventricular size is normal. No increase in right ventricular wall  thickness.   4. Left atrial size was normal.   5. Right atrial size was normal.   6. Moderate aortic valve annular calcification.   7. The mitral valve is normal in structure. No evidence of mitral valve  regurgitation. No evidence of mitral stenosis.   8. The tricuspid valve is normal in structure. Tricuspid valve  regurgitation is not demonstrated.   9. The aortic valve was not well visualized. Aortic valve regurgitation  is not visualized. No evidence of aortic valve sclerosis or stenosis.  10. There is Moderate calcification of the aortic valve.  11. There is Moderate thickening of the aortic valve.  12. The pulmonic valve was not well visualized. Pulmonic valve  regurgitation is not visualized.  13. The inferior vena cava is normal in size with greater than 50%  respiratory variability, suggesting right atrial pressure of 3 mmHg.    ASSESSMENT AND PLAN:  1.  Paroxysmal atrial fibrillation: Currently on Multaq.  CHA2DS2-VASc of 3.  Has unfortunately had more frequent episodes of atrial fibrillation.  She is continue to have episodes of atrial fibrillation or prefer to avoid long-term medications.  Due to that, we Kamauri Kathol plan for ablation.  Risk and benefits have been discussed.  She understands the risks and is agreed to the procedure.  Risk, benefits, and alternatives to EP study and radiofrequency ablation for afib were also discussed in detail today. These risks include but are not limited to stroke, bleeding, vascular damage, tamponade, perforation, damage to the esophagus, lungs, and other structures, pulmonary vein stenosis, worsening renal function, and death. The patient understands these risk and  wishes to proceed.  We Emmauel Hallums therefore proceed with catheter ablation at the next available time.  Carto, ICE, anesthesia are requested for the procedure.  Purvis Sidle also obtain CT PV protocol prior to the procedure to exclude LAA thrombus and further evaluate atrial anatomy.   2.  Obstructive sleep apnea: CPAP compliance encouraged  3.  Obesity: Lifestyle modification encouraged Body mass index is 29.66 kg/m.  4.  Coronary artery disease: Post PCI in 2016.  Low risk Myoview in 2021.  No current angina.  5.  Hypertension: well controlled  6.  Second hypercoagulable state: Currently on Eliquis for atrial fibrillation as above  Current medicines are reviewed at length with the patient today.   The patient does not have concerns regarding her medicines.  The following changes were made today:  none  Labs/ tests ordered today include:  No orders of the defined types were placed in this encounter.    Disposition:   FU with Christion Leonhard 3 months  Signed, Delane Wessinger Meredith Leeds, MD  05/22/2022 8:37 AM     [facility] [address] [place] [address] [phone] (office) [phone] (fax)

## 2022-05-31 IMAGING — DX DG CHEST 1V PORT
1 series · 1 of 1 positions shown · non-contrast
Comparison: 02/29/2020 at [DATE] p.m.

CLINICAL DATA: Sepsis, cough, fever, previous abnormal chest x-ray

EXAM:
PORTABLE CHEST 1 VIEW

[chest ap]
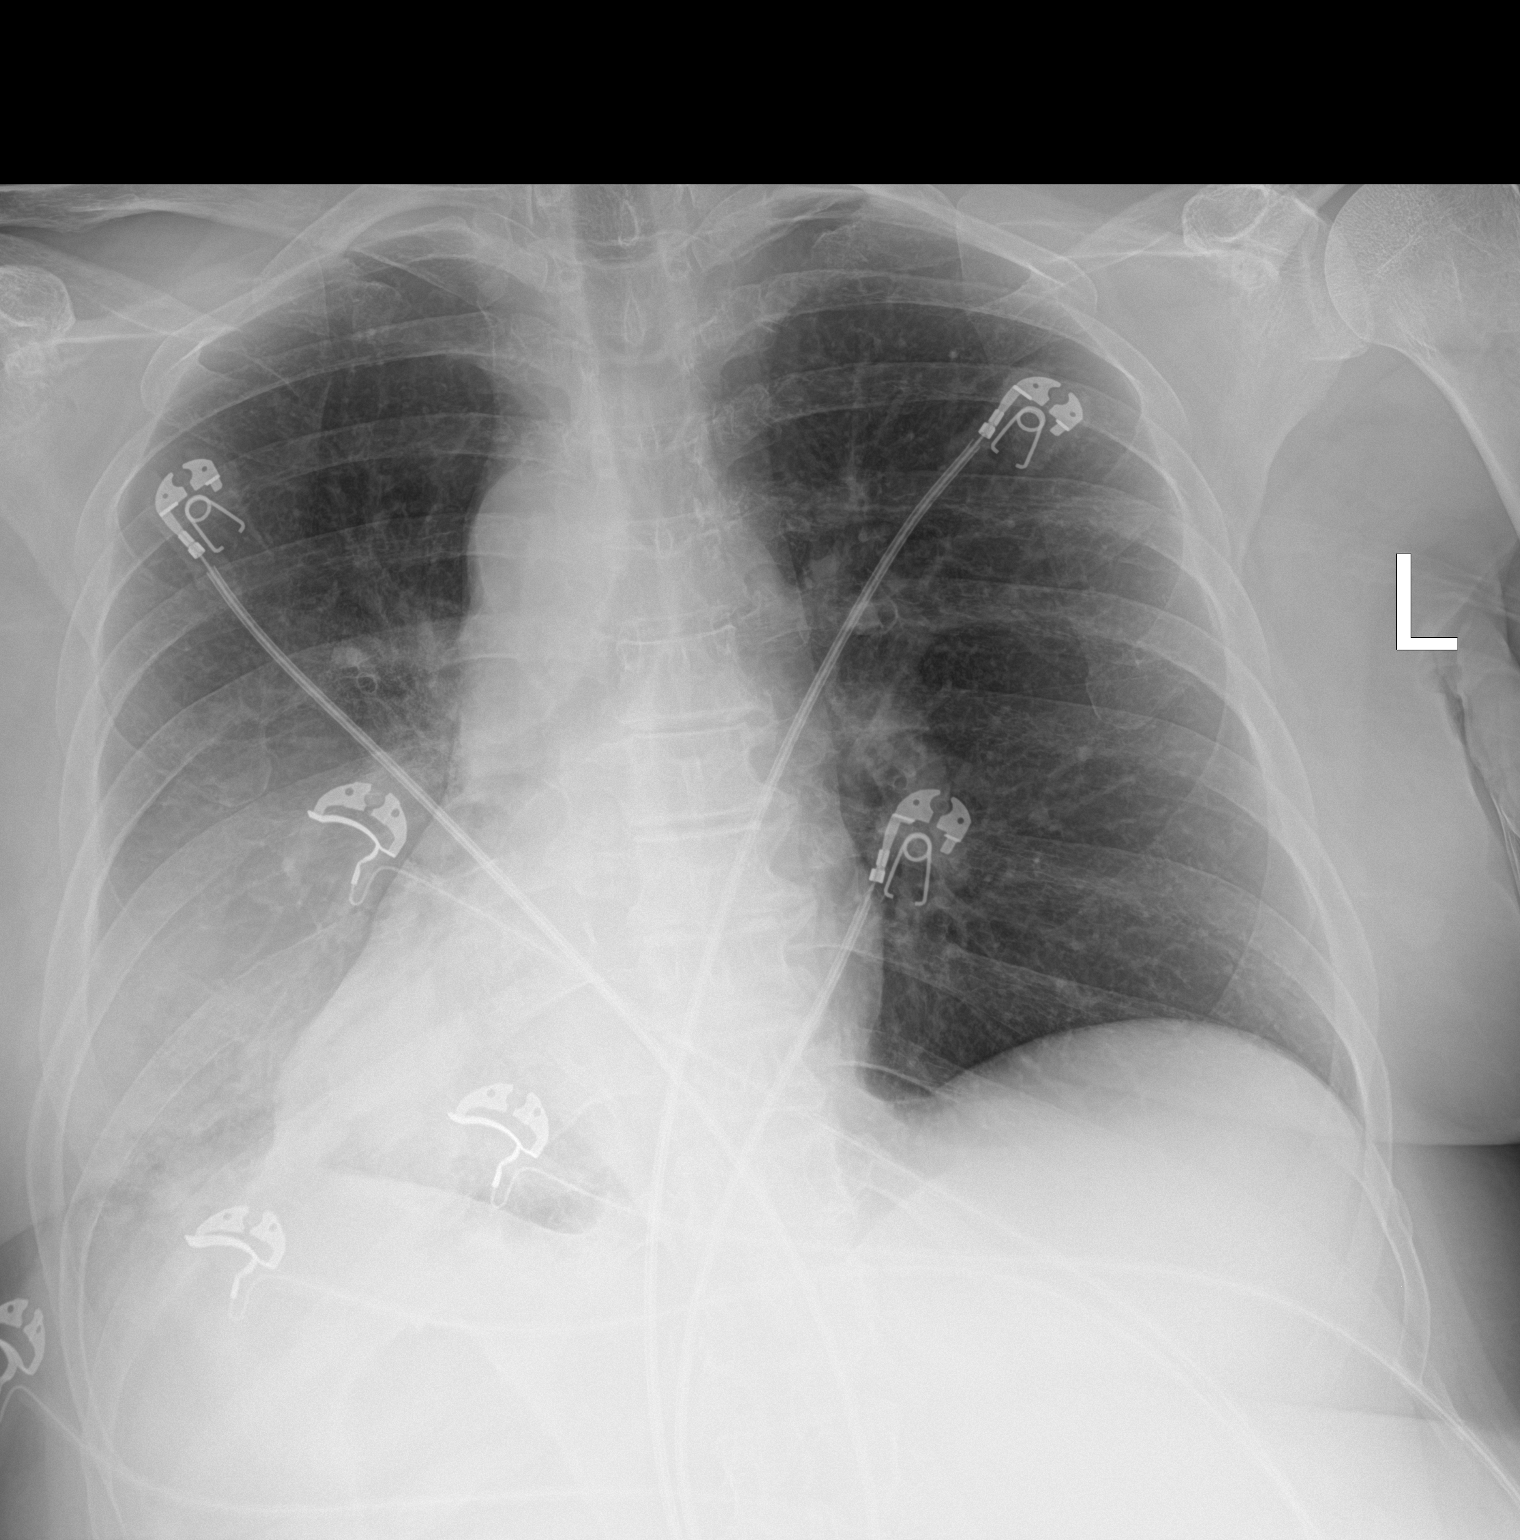

[1 of 1 positions shown; findings below may reference images not displayed]

FINDINGS: Single frontal view of the chest was performed. The image is
mislabeled by the technologist, as numerous prior exams demonstrate
the cardiac apex points toward the left.

The cardiac silhouette is unremarkable. There is dense left lower
lobe consolidation consistent with pneumonia. Small left effusion
cannot be excluded. No pneumothorax. Right chest is clear.
IMPRESSION: 1. Dense left lower lobe consolidation most compatible with
pneumonia. Followup PA and lateral chest X-ray is recommended in 3-4
weeks following trial of antibiotic therapy to ensure resolution and
exclude underlying malignancy.

## 2022-06-06 ENCOUNTER — Telehealth: Payer: Self-pay

## 2022-06-06 DIAGNOSIS — I480 Paroxysmal atrial fibrillation: Secondary | ICD-10-CM

## 2022-06-06 NOTE — Telephone Encounter (Signed)
Pt is scheduled for 09/23/22 @ 7:30. She will have labs done on 09/11/22.  Pt would like instruction letters mailed to her.

## 2022-06-06 NOTE — Telephone Encounter (Signed)
LM for pt to call back to schedule procedure. 

## 2022-06-06 NOTE — Addendum Note (Signed)
Addended by: Carylon Perches on: 06/06/2022 12:37 PM   Modules accepted: Orders

## 2022-06-06 NOTE — Telephone Encounter (Signed)
Patient is returning call.  °

## 2022-06-14 ENCOUNTER — Ambulatory Visit
Admission: EM | Admit: 2022-06-14 | Discharge: 2022-06-14 | Disposition: A | Discharge status: Home / Self Care | Payer: Medicare Other | Attending: Emergency Medicine | Admitting: Emergency Medicine

## 2022-06-14 DIAGNOSIS — K047 Periapical abscess without sinus: Secondary | ICD-10-CM

## 2022-06-14 MED ORDER — CHLORHEXIDINE GLUCONATE 0.12 % MT SOLN: 15.0000 mL | Freq: Three times a day (TID) | OROMUCOSAL | 0 refills | Status: AC

## 2022-06-14 MED ORDER — AMOXICILLIN-POT CLAVULANATE 875-125 MG PO TABS: 1.0000 | ORAL_TABLET | Freq: Two times a day (BID) | ORAL | 0 refills | Status: DC

## 2022-06-14 NOTE — Discharge Instructions (Addendum)
Continue to take OTC ibuprofen as needed for pain Augmentin was prescribed/take as directed Chlorhexidine mouthwash was prescribed Recommend soft diet until evaluated by dentist Maintain oral hygiene care Follow up with dentist as soon as possible for further evaluation and treatment  Return or go to the ED if you have any new or worsening symptoms such as fever, chills, difficulty swallowing, painful swallowing, oral or neck swelling, nausea, vomiting, chest pain, SOB, etc..Marland Kitchen

## 2022-06-14 NOTE — ED Triage Notes (Signed)
Pt presents with left side jaw swelling X 2 days.

## 2022-06-14 NOTE — ED Provider Notes (Signed)
[place]   [id number] 06/14/22 Arrival Time: [place]  Chief Complaint  Patient presents with   Facial Swelling     SUBJECTIVE:  Kari Harrison is a 67 y.o. female who presented to the urgent care with a complaint of left jaw swelling for the past 3 days.  Reports pain has slightly decreased.  Has tried OTC analgesics without relief.  Worse with chewing.  Has a [facility] appointment coming up this Monday.  Denies similar symptoms in the past.  Denies fever, chills, dysphagia, odynophagia, oral or neck swelling, nausea, vomiting, chest pain, SOB.    ROS: As per HPI.  All other pertinent ROS negative.      Past Medical History:  Diagnosis Date   CAD (coronary artery disease)    a. Cardiac CTA: severe 3VD;  b. 05/2014 Cath/PCI: LM nl, LAD 50-60p/m, 78m, D1 50ost/m, RI small/plaque, LCX dominant 20-30p, 59m, 60/90d (2.25x24 Promus DES), OM2 40-49m, RCA nondom, mod diff plaque, EF 55%.   Hyperlipidemia    Hypertension    Pneumonia 03/01/2020   Pulmonary nodules    a. 05/2014 CTA: left lung pulm nodules w/ rec for 3 month f/u noncontrast CT.   Tobacco abuse    Past Surgical History:  Procedure Laterality Date   CESAREAN SECTION     LEFT HEART CATHETERIZATION WITH CORONARY ANGIOGRAM N/A 06/16/2014   Procedure: LEFT HEART CATHETERIZATION WITH CORONARY ANGIOGRAM;  Surgeon: Jolaine Artist, MD;  Location: [facility] CATH LAB;  Service: Cardiovascular;  Laterality: N/A;   No Known Allergies No current facility-administered medications on file prior to encounter.   Current Outpatient Medications on File Prior to Encounter  Medication Sig Dispense Refill   albuterol (VENTOLIN HFA) 108 (90 Base) MCG/ACT inhaler Inhale 2 puffs into the lungs every 6 (six) hours as needed.     apixaban (ELIQUIS) 5 MG TABS tablet Take 1 tablet (5 mg total) by mouth 2 (two) times daily. 60 tablet 0   Ascorbic Acid (VITAMIN C) 1000 MG tablet Take 1,000 mg by mouth daily.     atorvastatin (LIPITOR) 80 MG  tablet take 1 tablet by mouth once daily ; MAKE DOCTOR'S APPOINTMENT ! 15 tablet 0   cholecalciferol (VITAMIN D3) 25 MCG (1000 UNIT) tablet Take 1,000 Units by mouth daily.     dronedarone (MULTAQ) 400 MG tablet Take 1 tablet (400 mg total) by mouth 2 (two) times daily with a meal. 60 tablet 3   lisinopril-hydrochlorothiazide (ZESTORETIC) 20-12.5 MG tablet Take 2 tablets by mouth daily. Please make overdue appt with Dr. Johney Frame before anymore refills. Thank you 3rd and Final attempt 30 tablet 0   loratadine (CLARITIN) 10 MG tablet 1 tablet Orally Once a day,PRN     metoprolol succinate (TOPROL-XL) 50 MG 24 hr tablet Take 25 mg by mouth daily.     metoprolol tartrate (LOPRESSOR) 25 MG tablet Take 25 mg by mouth See admin instructions. Once daily with food as needed for pulse over 100     Multiple Minerals (CALCIUM/MAGNESIUM/ZINC PO) Take 1 tablet by mouth daily.     omeprazole (PRILOSEC) 20 MG capsule 1 capsule 30 minutes before morning meal Orally Once a day prn GERD     venlafaxine XR (EFFEXOR-XR) 75 MG 24 hr capsule Take 75 mg by mouth daily.  0   Social History   Socioeconomic History   Marital status: Married    Spouse name: Not on file   Number of children: Not on file   Years of education: Not  on file   Highest education level: Not on file  Occupational History   Occupation: Chartered certified accountant  Tobacco Use   Smoking status: Every Day    Packs/day: 0.02    Years: 24.00    Additional pack years: 0.00    Total pack years: 0.48    Types: Cigarettes   Smokeless tobacco: Never   Tobacco comments:    denies need for a patch/ less than 6 a day  Vaping Use   Vaping Use: Never used  Substance and Sexual Activity   Alcohol use: No    Alcohol/week: 0.0 standard drinks of alcohol   Drug use: Not Currently    Types: Marijuana    Comment: occasional use   Sexual activity: Not on file  Other Topics Concern   Not on file  Social History Narrative   Not on file   Social Determinants  of Health   Financial Resource Strain: Not on file  Food Insecurity: Not on file  Transportation Needs: Not on file  Physical Activity: Not on file  Stress: Not on file  Social Connections: Not on file  Intimate Partner Violence: Not on file   Family History  Problem Relation Age of Onset   Arthritis Mother    Macular degeneration Mother    Heart attack Father    Hypertension Sister    Atrial fibrillation Neg Hx     OBJECTIVE:  Vitals:   06/14/22 1302  BP: 132/81  Pulse: 71  Resp: 17  Temp: 98.4 F (36.9 C)  TempSrc: Oral  SpO2: 94%    General appearance: alert; no distress HENT: normocephalic; atraumatic; dentition: fair; dental cavity to left Gum;  inflamed over left lower gums without areas of fluctuance; mass present tender to touch located on the left jaw Neck: supple without LAD Lungs: normal respirations Skin: warm and dry Psychological: alert and cooperative; normal mood and affect  ASSESSMENT & PLAN:  1. Dental abscess     Meds ordered this encounter  Medications   amoxicillin-clavulanate (AUGMENTIN) 875-125 MG tablet    Sig: Take 1 tablet by mouth every 12 (twelve) hours.    Dispense:  14 tablet    Refill:  0   chlorhexidine (PERIDEX) 0.12 % solution    Sig: Use as directed 15 mLs in the mouth or throat 3 (three) times daily for 5 days.    Dispense:  225 mL    Refill:  0    Discharge instructions  Continue to take OTC ibuprofen as needed for pain Augmentin was prescribed/take as directed Chlorhexidine mouthwash was prescribed Recommend soft diet until evaluated by dentist Maintain oral hygiene care Follow up with dentist as soon as possible for further evaluation and treatment  Return or go to the ED if you have any new or worsening symptoms such as fever, chills, difficulty swallowing, painful swallowing, oral or neck swelling, nausea, vomiting, chest pain, SOB, etc...  Reviewed expectations re: course of current medical issues. Questions  answered. Outlined signs and symptoms indicating need for more acute intervention. Patient verbalized understanding. After Visit Summary given.    Emerson Monte, FNP 06/14/22 1339

## 2022-06-27 ENCOUNTER — Ambulatory Visit
Admission: RE | Admit: 2022-06-27 | Discharge: 2022-06-27 | Disposition: A | Discharge status: Home / Self Care | Payer: Medicare Other | Source: Ambulatory Visit | Attending: Family Medicine | Admitting: Family Medicine

## 2022-06-27 DIAGNOSIS — Z1231 Encounter for screening mammogram for malignant neoplasm of breast: Secondary | ICD-10-CM | POA: Diagnosis not present

## 2022-07-18 ENCOUNTER — Ambulatory Visit: Payer: Medicare Other | Attending: Cardiology

## 2022-07-18 ENCOUNTER — Encounter: Payer: Self-pay | Admitting: *Deleted

## 2022-07-18 ENCOUNTER — Other Ambulatory Visit: Payer: Self-pay | Admitting: *Deleted

## 2022-07-18 DIAGNOSIS — Z01812 Encounter for preprocedural laboratory examination: Secondary | ICD-10-CM

## 2022-07-18 DIAGNOSIS — I480 Paroxysmal atrial fibrillation: Secondary | ICD-10-CM

## 2022-07-19 LAB — BASIC METABOLIC PANEL
BUN/Creatinine Ratio: 18 (ref 12–28)
BUN: 18 mg/dL (ref 8–27)
CO2: 21 mmol/L (ref 20–29)
Calcium: 9.2 mg/dL (ref 8.7–10.3)
Chloride: 106 mmol/L (ref 96–106)
Creatinine, Ser: 1.02 mg/dL — ABNORMAL HIGH (ref 0.57–1.00)
Glucose: 98 mg/dL (ref 70–99)
Potassium: 3.4 mmol/L — ABNORMAL LOW (ref 3.5–5.2)
Sodium: 143 mmol/L (ref 134–144)
eGFR: 60 mL/min/{1.73_m2} (ref >59)

## 2022-07-19 LAB — CBC
Hematocrit: 45.5 % (ref 34.0–46.6)
Hemoglobin: 15.3 g/dL (ref 11.1–15.9)
MCH: 31.8 pg (ref 26.6–33.0)
MCHC: 33.6 g/dL (ref 31.5–35.7)
MCV: 95 fL (ref 79–97)
Platelets: 274 10*3/uL (ref 150–450)
RBC: 4.81 x10E6/uL (ref 3.77–5.28)
RDW: 12.7 % (ref 11.7–15.4)
WBC: 10.5 10*3/uL (ref 3.4–10.8)

## 2022-07-19 LAB — SPECIMEN STATUS REPORT

## 2022-07-21 ENCOUNTER — Telehealth (HOSPITAL_COMMUNITY): Payer: Self-pay | Admitting: *Deleted

## 2022-07-21 NOTE — Telephone Encounter (Signed)
Patient returning call about her upcoming cardiac imaging study; pt verbalizes understanding of appt date/time, parking situation and where to check in, pre-test NPO status, and verified current allergies; name and call back number provided for further questions should they arise  Larey Brick RN Navigator Cardiac Imaging [facility] [phone] office [phone] cell  Patient aware to arrive at 2pm.

## 2022-07-21 NOTE — Telephone Encounter (Signed)
Attempted to call patient regarding upcoming cardiac CT appointment. °Left message on voicemail with name and callback number ° °Abdul Beirne RN Navigator Cardiac Imaging °[facility] °[phone] Office °[phone] Cell ° °

## 2022-07-22 ENCOUNTER — Ambulatory Visit (HOSPITAL_COMMUNITY)
Admission: RE | Admit: 2022-07-22 | Discharge: 2022-07-22 | Disposition: A | Discharge status: Home / Self Care | Payer: Medicare Other | Source: Ambulatory Visit | Attending: Cardiology | Admitting: Cardiology

## 2022-07-22 DIAGNOSIS — I480 Paroxysmal atrial fibrillation: Secondary | ICD-10-CM | POA: Diagnosis not present

## 2022-07-22 MED ORDER — IOHEXOL 350 MG/ML SOLN
95.0000 mL | Freq: Once | INTRAVENOUS | Status: AC | PRN
  Administered 2022-07-22: 95 mL via INTRAVENOUS

## 2022-07-23 NOTE — Pre-Procedure Instructions (Signed)
Instructed patient on the following items: Arrival time 0800 Nothing to eat or drink after midnight No meds AM of procedure Responsible person to drive you home and stay with you for 24 hrs  Have you missed any doses of anti-coagulant Eliquis- takes twice a day, hasn't missed any doses.  Don't take dose in the morning.

## 2022-07-24 ENCOUNTER — Ambulatory Visit (HOSPITAL_COMMUNITY): Payer: Medicare Other

## 2022-07-24 ENCOUNTER — Encounter (HOSPITAL_COMMUNITY)
Admission: RE | Disposition: A | Discharge status: Home / Self Care | Payer: Self-pay | Source: Home / Self Care | Attending: Cardiology

## 2022-07-24 ENCOUNTER — Ambulatory Visit (HOSPITAL_COMMUNITY)
Admission: RE | Admit: 2022-07-24 | Discharge: 2022-07-24 | Disposition: A | Discharge status: Home / Self Care | Payer: Medicare Other | Attending: Cardiology | Admitting: Cardiology

## 2022-07-24 ENCOUNTER — Ambulatory Visit (HOSPITAL_BASED_OUTPATIENT_CLINIC_OR_DEPARTMENT_OTHER): Payer: Medicare Other

## 2022-07-24 ENCOUNTER — Other Ambulatory Visit: Payer: Self-pay

## 2022-07-24 DIAGNOSIS — I10 Essential (primary) hypertension: Secondary | ICD-10-CM | POA: Insufficient documentation

## 2022-07-24 DIAGNOSIS — E785 Hyperlipidemia, unspecified: Secondary | ICD-10-CM | POA: Diagnosis not present

## 2022-07-24 DIAGNOSIS — F17210 Nicotine dependence, cigarettes, uncomplicated: Secondary | ICD-10-CM | POA: Insufficient documentation

## 2022-07-24 DIAGNOSIS — I480 Paroxysmal atrial fibrillation: Secondary | ICD-10-CM

## 2022-07-24 DIAGNOSIS — I4891 Unspecified atrial fibrillation: Secondary | ICD-10-CM | POA: Diagnosis not present

## 2022-07-24 DIAGNOSIS — I25119 Atherosclerotic heart disease of native coronary artery with unspecified angina pectoris: Secondary | ICD-10-CM | POA: Diagnosis not present

## 2022-07-24 DIAGNOSIS — F17200 Nicotine dependence, unspecified, uncomplicated: Secondary | ICD-10-CM | POA: Diagnosis not present

## 2022-07-24 DIAGNOSIS — I2510 Atherosclerotic heart disease of native coronary artery without angina pectoris: Secondary | ICD-10-CM | POA: Diagnosis not present

## 2022-07-24 HISTORY — PX: ATRIAL FIBRILLATION ABLATION: EP1191

## 2022-07-24 LAB — POCT ACTIVATED CLOTTING TIME
Activated Clotting Time: 385 seconds
Activated Clotting Time: 450 seconds

## 2022-07-24 LAB — POTASSIUM: Potassium: 3 mmol/L — ABNORMAL LOW (ref 3.5–5.1)

## 2022-07-24 SURGERY — ATRIAL FIBRILLATION ABLATION
Anesthesia: General

## 2022-07-24 MED ORDER — DOBUTAMINE INFUSION FOR EP/ECHO/NUC (1000 MCG/ML)
INTRAVENOUS | Status: AC
  Filled 2022-07-24: qty 250

## 2022-07-24 MED ORDER — HEPARIN (PORCINE) IN NACL 1000-0.9 UT/500ML-% IV SOLN
INTRAVENOUS | Status: DC | PRN
  Administered 2022-07-24 (×4): 500 mL

## 2022-07-24 MED ORDER — ONDANSETRON HCL 4 MG/2ML IJ SOLN: 4.0000 mg | Freq: Four times a day (QID) | INTRAMUSCULAR | Status: DC | PRN

## 2022-07-24 MED ORDER — DOBUTAMINE INFUSION FOR EP/ECHO/NUC (1000 MCG/ML)
INTRAVENOUS | Status: DC | PRN
  Administered 2022-07-24: 20 ug/kg/min via INTRAVENOUS

## 2022-07-24 MED ORDER — HEPARIN SODIUM (PORCINE) 1000 UNIT/ML IJ SOLN
INTRAMUSCULAR | Status: DC | PRN
  Administered 2022-07-24: 14000 [IU] via INTRAVENOUS

## 2022-07-24 MED ORDER — FENTANYL CITRATE (PF) 250 MCG/5ML IJ SOLN
INTRAMUSCULAR | Status: DC | PRN
  Administered 2022-07-24: 50 ug via INTRAVENOUS

## 2022-07-24 MED ORDER — POTASSIUM CHLORIDE CRYS ER 20 MEQ PO TBCR
30.0000 meq | EXTENDED_RELEASE_TABLET | Freq: Once | ORAL | Status: AC
  Administered 2022-07-24: 30 meq via ORAL
  Filled 2022-07-24: qty 1.5

## 2022-07-24 MED ORDER — SODIUM CHLORIDE 0.9% FLUSH: 3.0000 mL | Freq: Two times a day (BID) | INTRAVENOUS | Status: DC

## 2022-07-24 MED ORDER — SODIUM CHLORIDE 0.9 % IV SOLN: 250.0000 mL | INTRAVENOUS | Status: DC | PRN

## 2022-07-24 MED ORDER — SUGAMMADEX SODIUM 200 MG/2ML IV SOLN
INTRAVENOUS | Status: DC | PRN
  Administered 2022-07-24: 200 mg via INTRAVENOUS

## 2022-07-24 MED ORDER — HEPARIN SODIUM (PORCINE) 1000 UNIT/ML IJ SOLN
INTRAMUSCULAR | Status: AC
  Filled 2022-07-24: qty 10

## 2022-07-24 MED ORDER — SODIUM CHLORIDE 0.9% FLUSH: 3.0000 mL | INTRAVENOUS | Status: DC | PRN

## 2022-07-24 MED ORDER — ACETAMINOPHEN 325 MG PO TABS
650.0000 mg | ORAL_TABLET | ORAL | Status: DC | PRN
  Administered 2022-07-24: 650 mg via ORAL
  Filled 2022-07-24: qty 2

## 2022-07-24 MED ORDER — PROPOFOL 10 MG/ML IV BOLUS
INTRAVENOUS | Status: DC | PRN
  Administered 2022-07-24: 110 mg via INTRAVENOUS

## 2022-07-24 MED ORDER — PROTAMINE SULFATE 10 MG/ML IV SOLN
INTRAVENOUS | Status: DC | PRN
  Administered 2022-07-24: 20 mg via INTRAVENOUS
  Administered 2022-07-24 (×2): 10 mg via INTRAVENOUS

## 2022-07-24 MED ORDER — PHENYLEPHRINE 80 MCG/ML (10ML) SYRINGE FOR IV PUSH (FOR BLOOD PRESSURE SUPPORT)
PREFILLED_SYRINGE | INTRAVENOUS | Status: DC | PRN
  Administered 2022-07-24: 80 ug via INTRAVENOUS

## 2022-07-24 MED ORDER — DEXAMETHASONE SODIUM PHOSPHATE 10 MG/ML IJ SOLN
INTRAMUSCULAR | Status: DC | PRN
  Administered 2022-07-24: 10 mg via INTRAVENOUS

## 2022-07-24 MED ORDER — LIDOCAINE 2% (20 MG/ML) 5 ML SYRINGE
INTRAMUSCULAR | Status: DC | PRN
  Administered 2022-07-24: 70 mg via INTRAVENOUS

## 2022-07-24 MED ORDER — PHENYLEPHRINE HCL-NACL 20-0.9 MG/250ML-% IV SOLN
INTRAVENOUS | Status: DC | PRN
  Administered 2022-07-24: 20 ug/min via INTRAVENOUS

## 2022-07-24 MED ORDER — HEPARIN SODIUM (PORCINE) 1000 UNIT/ML IJ SOLN
INTRAMUSCULAR | Status: DC | PRN
  Administered 2022-07-24: 1000 [IU] via INTRAVENOUS

## 2022-07-24 MED ORDER — SODIUM CHLORIDE 0.9 % IV SOLN: INTRAVENOUS | Status: DC

## 2022-07-24 MED ORDER — ONDANSETRON HCL 4 MG/2ML IJ SOLN
INTRAMUSCULAR | Status: DC | PRN
  Administered 2022-07-24: 4 mg via INTRAVENOUS

## 2022-07-24 MED ORDER — ROCURONIUM BROMIDE 10 MG/ML (PF) SYRINGE
PREFILLED_SYRINGE | INTRAVENOUS | Status: DC | PRN
  Administered 2022-07-24: 60 mg via INTRAVENOUS

## 2022-07-24 SURGICAL SUPPLY — 20 items
BAG SNAP BAND KOVER 36X36 (MISCELLANEOUS) IMPLANT
CATH 8FR REPROCESSED SOUNDSTAR (CATHETERS) ×1 IMPLANT
CATH 8FR SOUNDSTAR REPROCESSED (CATHETERS) IMPLANT
CATH ABLAT QDOT MICRO BI TC DF (CATHETERS) IMPLANT
CATH OCTARAY 2.0 F 3-3-3-3-3 (CATHETERS) IMPLANT
CATH PIGTAIL STEERABLE D1 8.7 (WIRE) IMPLANT
CATH S-M CIRCA TEMP PROBE (CATHETERS) IMPLANT
CATH WEB BI DIR CSDF CRV REPRO (CATHETERS) IMPLANT
CLOSURE PERCLOSE PROSTYLE (VASCULAR PRODUCTS) IMPLANT
COVER SWIFTLINK CONNECTOR (BAG) ×1 IMPLANT
PACK EP LATEX FREE (CUSTOM PROCEDURE TRAY) ×1
PACK EP LF (CUSTOM PROCEDURE TRAY) ×1 IMPLANT
PAD DEFIB RADIO PHYSIO CONN (PAD) ×1 IMPLANT
PATCH CARTO3 (PAD) IMPLANT
SHEATH CARTO VIZIGO MED CURVE (SHEATH) IMPLANT
SHEATH PINNACLE 7F 10CM (SHEATH) IMPLANT
SHEATH PINNACLE 8F 10CM (SHEATH) IMPLANT
SHEATH PINNACLE 9F 10CM (SHEATH) IMPLANT
SHEATH PROBE COVER 6X72 (BAG) IMPLANT
TUBING SMART ABLATE COOLFLOW (TUBING) IMPLANT

## 2022-07-24 NOTE — Anesthesia Preprocedure Evaluation (Addendum)
Anesthesia Evaluation  Patient identified by MRN, date of birth, ID band Patient awake    Reviewed: Allergy & Precautions, NPO status , Patient's Chart, lab work & pertinent test results  Airway Mallampati: II  TM Distance: >3 FB Neck ROM: Full    Dental no notable dental hx. (+) Dental Advisory Given, Teeth Intact   Pulmonary pneumonia, Current Smoker   Pulmonary exam normal breath sounds clear to auscultation       Cardiovascular hypertension, Pt. on medications and Pt. on home beta blockers + angina  + CAD  Normal cardiovascular exam+ dysrhythmias Atrial Fibrillation  Rhythm:Regular Rate:Normal  Stress MPS 04/2019  Nuclear stress EF: 61%.  EKG nondiagnostic due to baseline changes  Normal perfusion. No ischemia or scar  This is a low risk study.   Echo 2020    Neuro/Psych negative neurological ROS     GI/Hepatic Neg liver ROS,GERD  ,,  Endo/Other  negative endocrine ROS    Renal/GU negative Renal ROS     Musculoskeletal negative musculoskeletal ROS (+)    Abdominal   Peds  Hematology negative hematology ROS (+)   Anesthesia Other Findings   Reproductive/Obstetrics                             Anesthesia Physical Anesthesia Plan  ASA: 3  Anesthesia Plan: General   Post-op Pain Management: Tylenol PO (pre-op)* and Minimal or no pain anticipated   Induction: Intravenous  PONV Risk Score and Plan: 2 and Ondansetron, Dexamethasone, Treatment may vary due to age or medical condition and Midazolam  Airway Management Planned: Oral ETT  Additional Equipment:   Intra-op Plan:   Post-operative Plan: Extubation in OR  Informed Consent: I have reviewed the patients History and Physical, chart, labs and discussed the procedure including the risks, benefits and alternatives for the proposed anesthesia with the patient or authorized representative who has indicated his/her  understanding and acceptance.     Dental advisory given  Plan Discussed with: CRNA  Anesthesia Plan Comments:         Anesthesia Quick Evaluation

## 2022-07-24 NOTE — Anesthesia Procedure Notes (Signed)
Procedure Name: Intubation Date/Time: 07/24/2022 10:18 AM  Performed by: Margarita Rana, CRNAPre-anesthesia Checklist: Patient identified, Patient being monitored, Timeout performed, Emergency Drugs available and Suction available Patient Re-evaluated:Patient Re-evaluated prior to induction Oxygen Delivery Method: Circle System Utilized Preoxygenation: Pre-oxygenation with 100% oxygen Induction Type: IV induction Ventilation: Mask ventilation without difficulty and Oral airway inserted - appropriate to patient size Laryngoscope Size: Mac and 3 Grade View: Grade I Tube type: Oral Tube size: 7.0 mm Number of attempts: 1 Airway Equipment and Method: Stylet Placement Confirmation: ETT inserted through vocal cords under direct vision, positive ETCO2 and breath sounds checked- equal and bilateral Secured at: 23 cm Tube secured with: Tape Dental Injury: Teeth and Oropharynx as per pre-operative assessment

## 2022-07-24 NOTE — H&P (Signed)
Electrophysiology Office Note   Date:  07/24/2022   ID:  [name], [name] [birth year], MRN [id number]  PCP:  Sigmund Hazel, MD  Cardiologist:  Al Pimple Primary Electrophysiologist:  Regan Lemming, MD    Chief Complaint: AF   History of Present Illness: Kari Harrison is a 67 y.o. female who is being seen today for the evaluation of AF at the request of No ref. provider found. Presenting today for electrophysiology evaluation.  She has a history significant for hypertension, hyperlipidemia, coronary disease, tobacco abuse, atrial fibrillation.  Atrial fibrillation was diagnosed in 2020 when she presented emergency room with palpitations.  She was started on rate control with diltiazem.  She converted to sinus rhythm without intervention.    She presented to A-fib clinic 05/01/2022 after being in atrial fibrillation for 1 week.  She did convert to sinus rhythm.  She usually convert to sinus rhythm after 2 days.  This was the longest episode she has had.  She is started Multaq.  Today, denies symptoms of palpitations, chest pain, shortness of breath, orthopnea, PND, lower extremity edema, claudication, dizziness, presyncope, syncope, bleeding, or neurologic sequela. The patient is tolerating medications without difficulties. Plan ablation today.    Past Medical History:  Diagnosis Date   CAD (coronary artery disease)    a. Cardiac CTA: severe 3VD;  b. 05/2014 Cath/PCI: LM nl, LAD 50-60p/m, 22m, D1 50ost/m, RI small/plaque, LCX dominant 20-30p, 47m, 60/90d (2.25x24 Promus DES), OM2 40-19m, RCA nondom, mod diff plaque, EF 55%.   Hyperlipidemia    Hypertension    Pneumonia 03/01/2020   Pulmonary nodules    a. 05/2014 CTA: left lung pulm nodules w/ rec for 3 month f/u noncontrast CT.   Tobacco abuse    Past Surgical History:  Procedure Laterality Date   CESAREAN SECTION     LEFT HEART CATHETERIZATION WITH CORONARY ANGIOGRAM N/A 06/16/2014   Procedure: LEFT HEART  CATHETERIZATION WITH CORONARY ANGIOGRAM;  Surgeon: Dolores Patty, MD;  Location: [facility] CATH LAB;  Service: Cardiovascular;  Laterality: N/A;     No current facility-administered medications for this encounter.    Allergies:   Patient has no known allergies.   Social History:  The patient  reports that she has been smoking cigarettes. She has a 0.48 pack-year smoking history. She has never used smokeless tobacco. She reports that she does not currently use drugs after having used the following drugs: Marijuana. She reports that she does not drink alcohol.   Family History:  The patient's family history includes Arthritis in her mother; Heart attack in her father; Hypertension in her sister; Macular degeneration in her mother.   ROS:  Please see the history of present illness.   Otherwise, review of systems is positive for none.   All other systems are reviewed and negative.   PHYSICAL EXAM: VS:  There were no vitals taken for this visit. , BMI There is no height or weight on file to calculate BMI. GEN: Well nourished, well developed, in no acute distress  HEENT: normal  Neck: no JVD, carotid bruits, or masses Cardiac: RRR; no murmurs, rubs, or gallops,no edema  Respiratory:  clear to auscultation bilaterally, normal work of breathing GI: soft, nontender, nondistended, + BS MS: no deformity or atrophy  Skin: warm and dry Neuro:  Strength and sensation are intact Psych: euthymic mood, full affect  Recent Labs: 04/30/2022: Magnesium 1.8 07/18/2022: BUN 18; Creatinine, Ser 1.02; Hemoglobin 15.3; Platelets 274; Potassium 3.4; Sodium 143  Lipid Panel     Component Value Date/Time   CHOL 71 03/01/2020 0305   CHOL 131 09/12/2019 0853   TRIG 237 (H) 03/01/2020 0305   HDL <10 (L) 03/01/2020 0305   HDL 42 09/12/2019 0853   CHOLHDL NOT CALCULATED 03/01/2020 0305   VLDL 47 (H) 03/01/2020 0305   LDLCALC NOT CALCULATED 03/01/2020 0305   LDLCALC 62 09/12/2019 0853     Wt Readings  from Last 3 Encounters:  05/22/22 71.2 kg  05/01/22 69.8 kg  08/23/20 69.3 kg      Other studies Reviewed: Additional studies/ records that were reviewed today include: TTE 02/20/2019  Review of the above records today demonstrates:   1. Left ventricular ejection fraction, by visual estimation, is 60 to  65%. The left ventricle has normal function. There is mildly increased  left ventricular hypertrophy.   2. Left ventricular diastolic parameters are indeterminate.   3. Global right ventricle has normal systolic function.The right  ventricular size is normal. No increase in right ventricular wall  thickness.   4. Left atrial size was normal.   5. Right atrial size was normal.   6. Moderate aortic valve annular calcification.   7. The mitral valve is normal in structure. No evidence of mitral valve  regurgitation. No evidence of mitral stenosis.   8. The tricuspid valve is normal in structure. Tricuspid valve  regurgitation is not demonstrated.   9. The aortic valve was not well visualized. Aortic valve regurgitation  is not visualized. No evidence of aortic valve sclerosis or stenosis.  10. There is Moderate calcification of the aortic valve.  11. There is Moderate thickening of the aortic valve.  12. The pulmonic valve was not well visualized. Pulmonic valve  regurgitation is not visualized.  13. The inferior vena cava is normal in size with greater than 50%  respiratory variability, suggesting right atrial pressure of 3 mmHg.    ASSESSMENT AND PLAN:  1.  Paroxysmal atrial fibrillation: Kari Harrison has presented today for surgery, with the diagnosis of AF.  The various methods of treatment have been discussed with the patient and family. After consideration of risks, benefits and other options for treatment, the patient has consented to  Procedure(s): Catheter ablation as a surgical intervention .  Risks include but not limited to complete heart block, stroke, esophageal  damage, nerve damage, bleeding, vascular damage, tamponade, perforation, MI, and death. The patient's history has been reviewed, patient examined, no change in status, stable for surgery.  I have reviewed the patient's chart and labs.  Questions were answered to the patient's satisfaction.    Aadam Zhen Elberta Fortis, MD 07/24/2022 7:43 AM

## 2022-07-24 NOTE — Transfer of Care (Signed)
Immediate Anesthesia Transfer of Care Note  Patient: Kari Harrison  Procedure(s) Performed: ATRIAL FIBRILLATION ABLATION  Patient Location: Cath Lab  Anesthesia Type:General  Level of Consciousness: awake, patient cooperative, and responds to stimulation  Airway & Oxygen Therapy: Patient Spontanous Breathing and Patient connected to nasal cannula oxygen  Post-op Assessment: Report given to RN and Post -op Vital signs reviewed and stable  Post vital signs: Reviewed and stable  Last Vitals:  Vitals Value Taken Time  BP 128/64 12:17  Temp    Pulse 77   Resp 14   SpO2 95     Last Pain:  Vitals:   07/24/22 0757  TempSrc: Oral         Complications: There were no known notable events for this encounter.

## 2022-07-24 NOTE — Discharge Instructions (Signed)

## 2022-07-25 ENCOUNTER — Encounter (HOSPITAL_COMMUNITY): Payer: Self-pay | Admitting: Cardiology

## 2022-07-25 NOTE — Anesthesia Postprocedure Evaluation (Signed)
Anesthesia Post Note  Patient: Kari Harrison  Procedure(s) Performed: ATRIAL FIBRILLATION ABLATION     Patient location during evaluation: Cath Lab Anesthesia Type: General Level of consciousness: sedated and patient cooperative Pain management: pain level controlled Vital Signs Assessment: post-procedure vital signs reviewed and stable Respiratory status: spontaneous breathing Cardiovascular status: stable Anesthetic complications: no   There were no known notable events for this encounter.  Last Vitals:  Vitals:   07/24/22 1500 07/24/22 1600  BP: 105/68 105/67  Pulse: 67 72  Resp: 13 20  Temp:    SpO2: 94% 91%    Last Pain:  Vitals:   07/25/22 0933  TempSrc:   PainSc: 0-No pain                 Lewie Loron

## 2022-08-21 ENCOUNTER — Ambulatory Visit (HOSPITAL_COMMUNITY)
Admission: RE | Admit: 2022-08-21 | Discharge: 2022-08-21 | Disposition: A | Discharge status: Home / Self Care | Payer: Medicare Other | Source: Ambulatory Visit | Attending: Physician Assistant | Admitting: Physician Assistant

## 2022-08-21 VITALS — BP 114/80 | HR 60 | Ht 62.0 in | Wt 154.6 lb

## 2022-08-21 DIAGNOSIS — I2510 Atherosclerotic heart disease of native coronary artery without angina pectoris: Secondary | ICD-10-CM | POA: Diagnosis not present

## 2022-08-21 DIAGNOSIS — Z955 Presence of coronary angioplasty implant and graft: Secondary | ICD-10-CM | POA: Diagnosis not present

## 2022-08-21 DIAGNOSIS — I10 Essential (primary) hypertension: Secondary | ICD-10-CM | POA: Insufficient documentation

## 2022-08-21 DIAGNOSIS — Z5181 Encounter for therapeutic drug level monitoring: Secondary | ICD-10-CM

## 2022-08-21 DIAGNOSIS — F17210 Nicotine dependence, cigarettes, uncomplicated: Secondary | ICD-10-CM | POA: Diagnosis not present

## 2022-08-21 DIAGNOSIS — G4733 Obstructive sleep apnea (adult) (pediatric): Secondary | ICD-10-CM | POA: Diagnosis not present

## 2022-08-21 DIAGNOSIS — E785 Hyperlipidemia, unspecified: Secondary | ICD-10-CM | POA: Diagnosis not present

## 2022-08-21 DIAGNOSIS — D6869 Other thrombophilia: Secondary | ICD-10-CM | POA: Insufficient documentation

## 2022-08-21 DIAGNOSIS — Z7901 Long term (current) use of anticoagulants: Secondary | ICD-10-CM | POA: Diagnosis not present

## 2022-08-21 DIAGNOSIS — I480 Paroxysmal atrial fibrillation: Secondary | ICD-10-CM | POA: Insufficient documentation

## 2022-08-21 DIAGNOSIS — Z79899 Other long term (current) drug therapy: Secondary | ICD-10-CM | POA: Insufficient documentation

## 2022-08-21 NOTE — Progress Notes (Signed)
Primary Care Physician: Sigmund Hazel, MD Primary Cardiologist: Dr McLean/Dr Elease Hashimoto Primary Electrophysiologist: Dr Elberta Fortis Referring Physician: Dr Tonny Bollman Kari Harrison is a 67 y.o. female with a history of hypertension, hyperlipidemia, coronary artery disease, tobacco abuse and paroxysmal atrial fibrillation who presents for follow up in the [facility].  The patient was initially diagnosed with atrial fibrillation 02/20/19 after presenting to the ER with symptoms of tachy palpitations and was found to be in afib with RVR. She was admitted and rate controlled on diltiazem drip. She was started on Eliquis for a CHADS2VASC score of 3. A TEE/DCCV was planned but she converted to SR on her own. She denies any alcohol use.   She was seen in the ED 04/30/22 for ongoing afib x one week. She was being seen at PCP office and had afib with RVR and soft BP. She was directed to the ED. The pt was found to mildly dehydrated and with a K+ of 3.2. She received fluids and K+ was repleted. She spontaneously converted to SR after discharge. She was started on Multaq and referred to EP to discuss ablation. She is s/p afib ablation with Dr Elberta Fortis on 07/24/22.  On follow up today, patient reports that she has had two brief episodes of afib since her ablation. None in the past couple weeks. She denies chest pain, swallowing pain, or groin issues.   Today, she denies symptoms of palpitations, chest pain, shortness of breath, orthopnea, PND, lower extremity edema, dizziness, presyncope, syncope, bleeding, or neurologic sequela. The patient is tolerating medications without difficulties and is otherwise without complaint today.    Atrial Fibrillation Risk Factors:  she does have symptoms or diagnosis of sleep apnea. she does not have a history of rheumatic fever. she does not have a history of alcohol use. The patient does not have a history of early familial atrial fibrillation or  other arrhythmias.  she has a BMI of Body mass index is 28.28 kg/m.Marland Kitchen Filed Weights   08/21/22 1113  Weight: 70.1 kg    Family History  Problem Relation Age of Onset   Arthritis Mother    Macular degeneration Mother    Heart attack Father    Hypertension Sister    Atrial fibrillation Neg Hx      Atrial Fibrillation Management history:  Previous antiarrhythmic drugs: Multaq  Previous cardioversions: none Previous ablations: 07/24/22 Anticoagulation history: Eliquis   Past Medical History:  Diagnosis Date   CAD (coronary artery disease)    a. Cardiac CTA: severe 3VD;  b. 05/2014 Cath/PCI: LM nl, LAD 50-60p/m, 58m, D1 50ost/m, RI small/plaque, LCX dominant 20-30p, 58m, 60/90d (2.25x24 Promus DES), OM2 40-42m, RCA nondom, mod diff plaque, EF 55%.   Hyperlipidemia    Hypertension    Pneumonia 03/01/2020   Pulmonary nodules    a. 05/2014 CTA: left lung pulm nodules w/ rec for 3 month f/u noncontrast CT.   Tobacco abuse    Past Surgical History:  Procedure Laterality Date   ATRIAL FIBRILLATION ABLATION N/A 07/24/2022   Procedure: ATRIAL FIBRILLATION ABLATION;  Surgeon: Regan Lemming, MD;  Location: [facility] INVASIVE CV LAB;  Service: Cardiovascular;  Laterality: N/A;   CESAREAN SECTION     LEFT HEART CATHETERIZATION WITH CORONARY ANGIOGRAM N/A 06/16/2014   Procedure: LEFT HEART CATHETERIZATION WITH CORONARY ANGIOGRAM;  Surgeon: Dolores Patty, MD;  Location: [facility] CATH LAB;  Service: Cardiovascular;  Laterality: N/A;    Current Outpatient Medications  Medication Sig Dispense  Refill   albuterol (VENTOLIN HFA) 108 (90 Base) MCG/ACT inhaler Inhale 2 puffs into the lungs every 6 (six) hours as needed.     apixaban (ELIQUIS) 5 MG TABS tablet Take 5 mg by mouth 2 (two) times daily.     Ascorbic Acid (VITAMIN C) 1000 MG tablet Take 1,000 mg by mouth daily.     atorvastatin (LIPITOR) 80 MG tablet take 1 tablet by mouth once daily ; MAKE DOCTOR'S APPOINTMENT ! 15 tablet 0    cholecalciferol (VITAMIN D3) 25 MCG (1000 UNIT) tablet Take 1,000 Units by mouth daily.     dronedarone (MULTAQ) 400 MG tablet Take 1 tablet (400 mg total) by mouth 2 (two) times daily with a meal. 60 tablet 3   lisinopril-hydrochlorothiazide (ZESTORETIC) 20-12.5 MG tablet Take 2 tablets by mouth daily. Please make overdue appt with Dr. Shari Prows before anymore refills. Thank you 3rd and Final attempt 30 tablet 0   loratadine (CLARITIN) 10 MG tablet Take 10 mg by mouth daily as needed for allergies.     metoprolol succinate (TOPROL-XL) 50 MG 24 hr tablet Take 25 mg by mouth daily.     metoprolol tartrate (LOPRESSOR) 25 MG tablet Take 25 mg by mouth See admin instructions. Once daily with food as needed for pulse over 100     Multiple Minerals (CALCIUM/MAGNESIUM/ZINC PO) Take 1 tablet by mouth daily.     omeprazole (PRILOSEC) 20 MG capsule Take 20 mg by mouth daily.     venlafaxine XR (EFFEXOR-XR) 75 MG 24 hr capsule Take 75 mg by mouth daily.  0   No current facility-administered medications for this encounter.    No Known Allergies  Social History   Socioeconomic History   Marital status: Married    Spouse name: Not on file   Number of children: Not on file   Years of education: Not on file   Highest education level: Not on file  Occupational History   Occupation: Agricultural consultant  Tobacco Use   Smoking status: Every Day    Packs/day: 0.02    Years: 24.00    Additional pack years: 0.00    Total pack years: 0.48    Types: Cigarettes   Smokeless tobacco: Never   Tobacco comments:    denies need for a patch/ less than 6 a day  Vaping Use   Vaping Use: Never used  Substance and Sexual Activity   Alcohol use: No    Alcohol/week: 0.0 standard drinks of alcohol   Drug use: Not Currently    Types: Marijuana    Comment: occasional use   Sexual activity: Not on file  Other Topics Concern   Not on file  Social History Narrative   Not on file   Social Determinants of Health    Financial Resource Strain: Not on file  Food Insecurity: Not on file  Transportation Needs: Not on file  Physical Activity: Not on file  Stress: Not on file  Social Connections: Not on file  Intimate Partner Violence: Not on file     ROS- All systems are reviewed and negative except as per the HPI above.  Physical Exam: Vitals:   08/21/22 1113  BP: 114/80  Pulse: 60  Weight: 70.1 kg  Height: 5\' 2"  (1.575 m)    GEN- The patient is a well appearing female, alert and oriented x 3 today.   HEENT-head normocephalic, atraumatic, sclera clear, conjunctiva pink, hearing intact, trachea midline. Lungs- Clear to ausculation bilaterally, normal work of breathing Heart-  Regular rate and rhythm, no murmurs, rubs or gallops  GI- soft, NT, ND, + BS Extremities- no clubbing, cyanosis, or edema MS- no significant deformity or atrophy Skin- no rash or lesion Psych- euthymic mood, full affect Neuro- strength and sensation are intact   Wt Readings from Last 3 Encounters:  08/21/22 70.1 kg  07/24/22 68 kg  05/22/22 71.2 kg    EKG today demonstrates  SR Vent. rate 60 BPM PR interval 170 ms QRS duration 76 ms QT/QTcB 426/426 ms  Echo 02/20/19 demonstrated  1. Left ventricular ejection fraction, by visual estimation, is 60 to 65%. The left ventricle has normal function. There is mildly increased left ventricular hypertrophy.  2. Left ventricular diastolic parameters are indeterminate.  3. Global right ventricle has normal systolic function.The right ventricular size is normal. No increase in right ventricular wall thickness.  4. Left atrial size was normal.  5. Right atrial size was normal.  6. Moderate aortic valve annular calcification.  7. The mitral valve is normal in structure. No evidence of mitral valve regurgitation. No evidence of mitral stenosis.  8. The tricuspid valve is normal in structure. Tricuspid valve regurgitation is not demonstrated.  9. The aortic valve was not  well visualized. Aortic valve regurgitation is not visualized. No evidence of aortic valve sclerosis or stenosis. 10. There is Moderate calcification of the aortic valve. 11. There is Moderate thickening of the aortic valve. 12. The pulmonic valve was not well visualized. Pulmonic valve regurgitation is not visualized. 13. The inferior vena cava is normal in size with greater than 50% respiratory variability, suggesting right atrial pressure of 3 mmHg.    Epic records are reviewed at length today   CHA2DS2-VASc Score = 4  The patient's score is based upon: CHF History: 0 HTN History: 1 Diabetes History: 0 Stroke History: 0 Vascular Disease History: 1 Age Score: 1 Gender Score: 1       ASSESSMENT AND PLAN: 1. Paroxysmal Atrial Fibrillation (ICD10:  I48.0) The patient's CHA2DS2-VASc score is 4, indicating a 4.8% annual risk of stroke.   S/p afib ablation 07/24/22 Patient in SR today.  Continue Eliquis 5 mg BID with no missed doses for 3 months post ablation. Continue Multaq 400 mg BID for now.  Continue metoprolol 50 mg daily  Continue Eliquis 5 mg BID  2. Secondary Hypercoagulable State (ICD10:  D68.69) The patient is at significant risk for stroke/thromboembolism based upon her CHA2DS2-VASc Score of 4.  Continue Apixaban (Eliquis).   3. OSA Patient not currently on CPAP, never received machine after titration. She has deferred repeat study for now.   4. CAD S/p PCI in 2016. Low risk myoview 04/08/19 No anginal symptoms.  5. HTN Stable, no changes today.   Follow up with Dr Elberta Fortis as scheduled.    Jorja Loa PA-C Afib Clinic [facility] [address] [place], [place] [address] [phone]

## 2022-08-26 ENCOUNTER — Other Ambulatory Visit (HOSPITAL_COMMUNITY): Payer: Self-pay | Admitting: *Deleted

## 2022-08-26 MED ORDER — MULTAQ 400 MG PO TABS: 400.0000 mg | ORAL_TABLET | Freq: Two times a day (BID) | ORAL | 6 refills | Status: DC

## 2022-09-11 ENCOUNTER — Ambulatory Visit: Payer: Medicare Other | Attending: Cardiology

## 2022-09-11 DIAGNOSIS — I480 Paroxysmal atrial fibrillation: Secondary | ICD-10-CM

## 2022-09-12 LAB — CBC
Hematocrit: 45.9 % (ref 34.0–46.6)
Hemoglobin: 15.5 g/dL (ref 11.1–15.9)
MCH: 31.4 pg (ref 26.6–33.0)
MCHC: 33.8 g/dL (ref 31.5–35.7)
MCV: 93 fL (ref 79–97)
Platelets: 288 10*3/uL (ref 150–450)
RBC: 4.94 x10E6/uL (ref 3.77–5.28)
RDW: 12.3 % (ref 11.7–15.4)
WBC: 9.6 10*3/uL (ref 3.4–10.8)

## 2022-09-12 LAB — BASIC METABOLIC PANEL
BUN/Creatinine Ratio: 17 (ref 12–28)
BUN: 18 mg/dL (ref 8–27)
CO2: 24 mmol/L (ref 20–29)
Calcium: 10.5 mg/dL — ABNORMAL HIGH (ref 8.7–10.3)
Chloride: 106 mmol/L (ref 96–106)
Creatinine, Ser: 1.05 mg/dL — ABNORMAL HIGH (ref 0.57–1.00)
Glucose: 101 mg/dL — ABNORMAL HIGH (ref 70–99)
Potassium: 4 mmol/L (ref 3.5–5.2)
Sodium: 144 mmol/L (ref 134–144)
eGFR: 58 mL/min/{1.73_m2} — ABNORMAL LOW (ref >59)

## 2022-09-16 ENCOUNTER — Other Ambulatory Visit (HOSPITAL_COMMUNITY): Payer: Medicare Other

## 2022-10-21 ENCOUNTER — Ambulatory Visit (HOSPITAL_COMMUNITY): Payer: Medicare Other | Admitting: Physician Assistant

## 2022-10-24 ENCOUNTER — Encounter: Payer: Self-pay | Admitting: Cardiology

## 2022-10-24 ENCOUNTER — Ambulatory Visit: Payer: Medicare Other | Attending: Cardiology | Admitting: Cardiology

## 2022-10-24 VITALS — BP 118/70 | HR 59 | Ht 62.0 in | Wt 156.6 lb

## 2022-10-24 DIAGNOSIS — D6869 Other thrombophilia: Secondary | ICD-10-CM

## 2022-10-24 DIAGNOSIS — I480 Paroxysmal atrial fibrillation: Secondary | ICD-10-CM | POA: Diagnosis not present

## 2022-10-24 DIAGNOSIS — G4733 Obstructive sleep apnea (adult) (pediatric): Secondary | ICD-10-CM

## 2022-10-24 DIAGNOSIS — I10 Essential (primary) hypertension: Secondary | ICD-10-CM | POA: Diagnosis not present

## 2022-10-24 NOTE — Progress Notes (Signed)
Electrophysiology Office Note:   Date:  10/24/2022  ID:  Kari Harrison, DOB 25-Dec-1955, MRN [id number]  Primary Cardiologist: Kristeen Miss, MD Electrophysiologist: Regan Lemming, MD      History of Present Illness:   Kari Harrison is a 67 y.o. female with h/o atrial fibrillation seen today for routine electrophysiology followup.  Since last being seen in our clinic the patient reports doing well.  She has had 2 episodes of atrial fibrillation immediately following ablation that lasted approximately 30 minutes.  Aside from that, she has had no further episodes.  She is feeling well.  She has been having an increase in diarrhea which she attributes to her Multaq.  She would like to stop this medication.  she denies chest pain, palpitations, dyspnea, PND, orthopnea, nausea, vomiting, dizziness, syncope, edema, weight gain, or early satiety.     She has a history of hypertension, hyperlipidemia, coronary artery disease, tobacco abuse, atrial fibrillation.  Atrial fibrillation was diagnosed in 2020 when she presented to the emergency room with palpitations.  She was started on diltiazem.  She unfortunately continued to have a episodes of atrial fibrillation was started on Multaq.  She is now post ablation 07/24/2022.     Review of systems complete and found to be negative unless listed in HPI.   EP Information / Studies Reviewed:    EKG is ordered today. Personal review as below.  EKG Interpretation Date/Time:  Friday October 24 2022 15:28:26 EDT Ventricular Rate:  59 PR Interval:  176 QRS Duration:  74 QT Interval:  464 QTC Calculation: 459 R Axis:   25  Text Interpretation: Sinus bradycardia T wave abnormality, consider lateral ischemia When compared with ECG of 21-Aug-2022 11:41, No significant change was found Confirmed by Narada Uzzle ([id number]) on 10/24/2022 3:34:40 PM     Risk Assessment/Calculations:    CHA2DS2-VASc Score = 4   This indicates a 4.8% annual risk of  stroke. The patient's score is based upon: CHF History: 0 HTN History: 1 Diabetes History: 0 Stroke History: 0 Vascular Disease History: 1 Age Score: 1 Gender Score: 1             Physical Exam:   VS:  BP 118/70 (BP Location: Left Arm, Patient Position: Sitting, Cuff Size: Large)   Pulse (!) 59   Ht 5\' 2"  (1.575 m)   Wt 156 lb 9.6 oz (71 kg)   SpO2 96%   BMI 28.64 kg/m    Wt Readings from Last 3 Encounters:  10/24/22 156 lb 9.6 oz (71 kg)  08/21/22 154 lb 9.6 oz (70.1 kg)  07/24/22 150 lb (68 kg)     GEN: Well nourished, well developed in no acute distress NECK: No JVD; No carotid bruits CARDIAC: Regular rate and rhythm, no murmurs, rubs, gallops RESPIRATORY:  Clear to auscultation without rales, wheezing or rhonchi  ABDOMEN: Soft, non-tender, non-distended EXTREMITIES:  No edema; No deformity   ASSESSMENT AND PLAN:    1.  Paroxysmal atrial fibrillation: Currently on Multaq and Eliquis.  Status post ablation 07/24/2022.  She had 2 episodes of atrial fibrillation within the first few weeks after ablation but no further episodes.  She would like to stop her Multaq due to diarrhea.  Kari Harrison stop it today.  2.  Obstructive sleep apnea: CPAP compliance encouraged  3.  Obesity: Lifestyle modification encouraged  4.  Coronary artery disease: PCI in 2016.  Low risk Myoview in 2021.  Plan per primary cardiology.  5.  Hypertension: Currently  well-controlled  6.  Secondary hypercoagulable state: Currently on Eliquis for atrial fibrillation  Follow up with Afib Clinic in 6 months  Signed, Liela Rylee Jorja Loa, MD

## 2022-10-24 NOTE — Patient Instructions (Addendum)
Medication Instructions:  Your physician has recommended you make the following change in your medication:  STOP Multaq  *If you need a refill on your cardiac medications before your next appointment, please call your pharmacy*   Lab Work: None ordered If you have labs (blood work) drawn today and your tests are completely normal, you will receive your results only by: MyChart Message (if you have MyChart) OR A paper copy in the mail If you have any lab test that is abnormal or we need to change your treatment, we will call you to review the results.   Testing/Procedures: None ordered   Follow-Up: At [facility], you and your health needs are our priority.  As part of our continuing mission to provide you with exceptional heart care, we have created designated Provider Care Teams.  These Care Teams include your primary Cardiologist (physician) and Advanced Practice Providers (APPs -  Physician Assistants and Nurse Practitioners) who all work together to provide you with the care you need, when you need it.  Your next appointment:   6 month(s)  The format for your next appointment:   In Person  Provider:   Afib clinic   Thank you for choosing [facility]!!   Dory Horn, RN [phone]

## 2022-10-28 ENCOUNTER — Ambulatory Visit: Payer: Medicare Other | Admitting: Cardiology

## 2022-11-09 IMAGING — MG MM DIGITAL SCREENING BILAT W/ TOMO AND CAD
8 series · 9 of 24 positions shown · non-contrast
Comparison: Previous exam(s).

CLINICAL DATA: Screening.

EXAM:
DIGITAL SCREENING BILATERAL MAMMOGRAM WITH TOMOSYNTHESIS AND CAD
TECHNIQUE: Bilateral screening digital craniocaudal and mediolateral oblique
mammograms were obtained. Bilateral screening digital breast
tomosynthesis was performed. The images were evaluated with
computer-aided detection.

[R CC synth-2D]
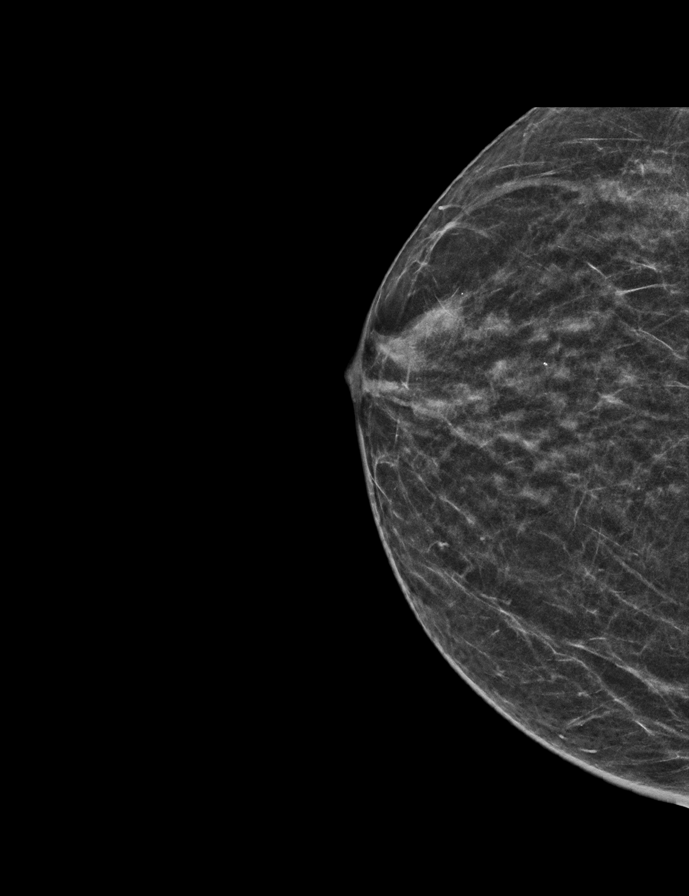

[L MLO synth-2D]
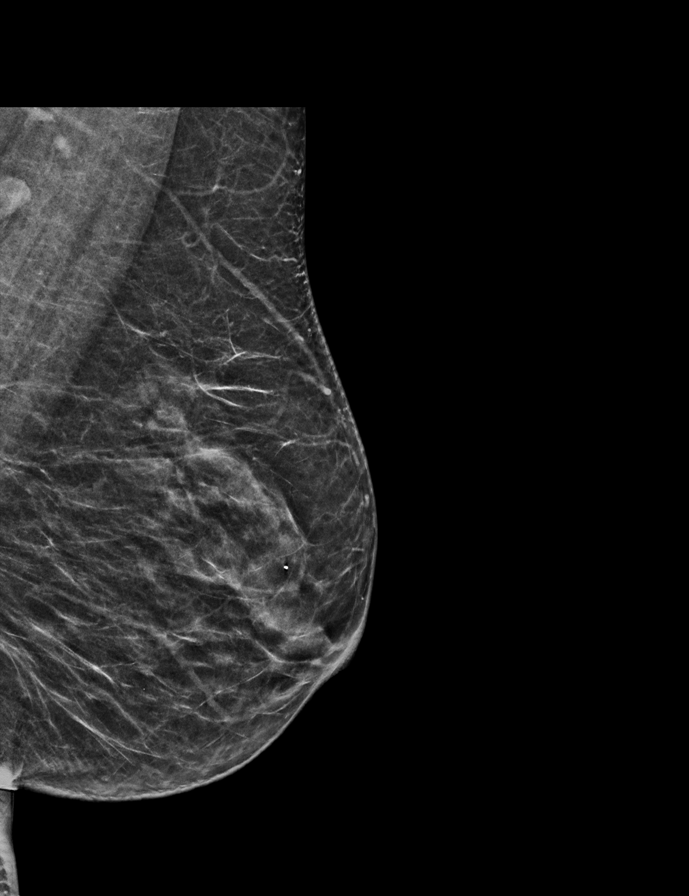

[L CC synth-2D]
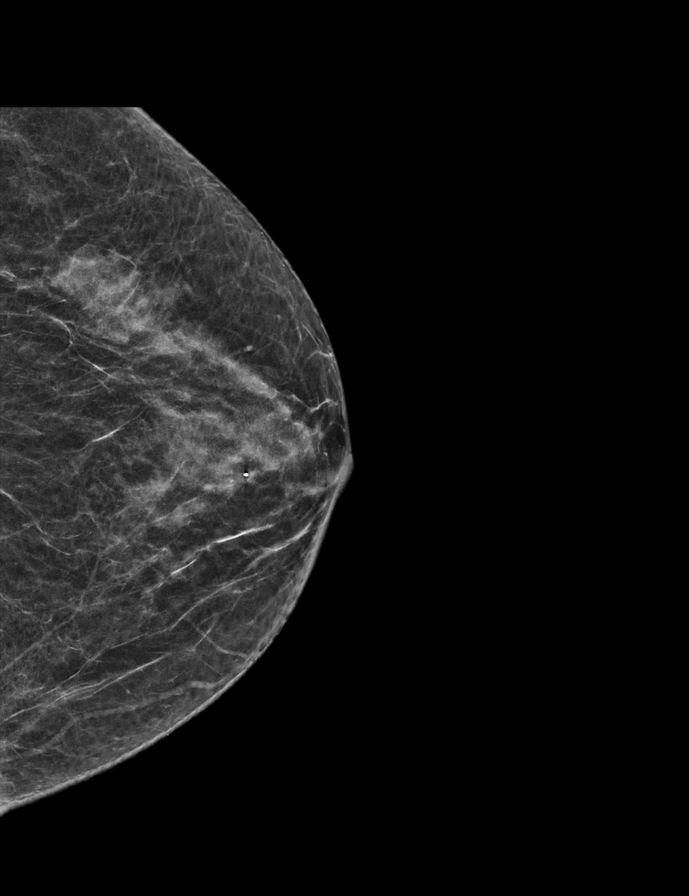

[R MLO synth-2D]
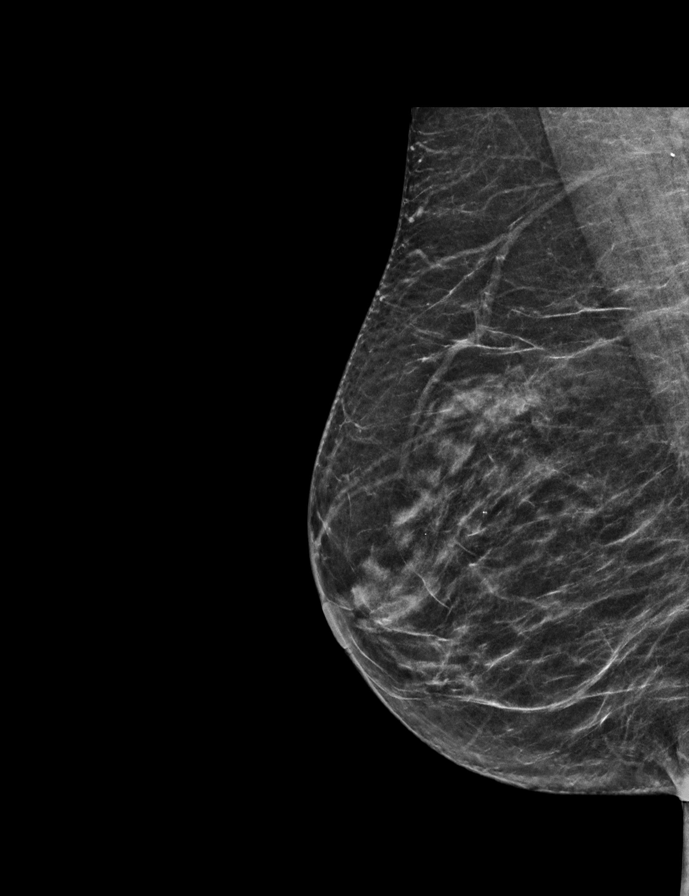

[R MLO tomo · 2 of 51 frames shown]
[frame 17/51]
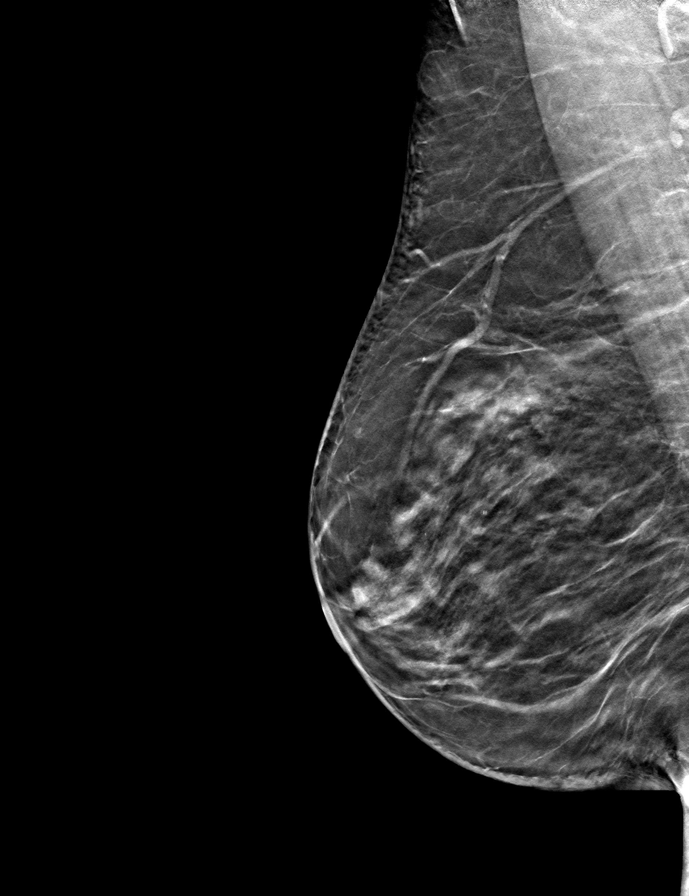
[frame 26/51]
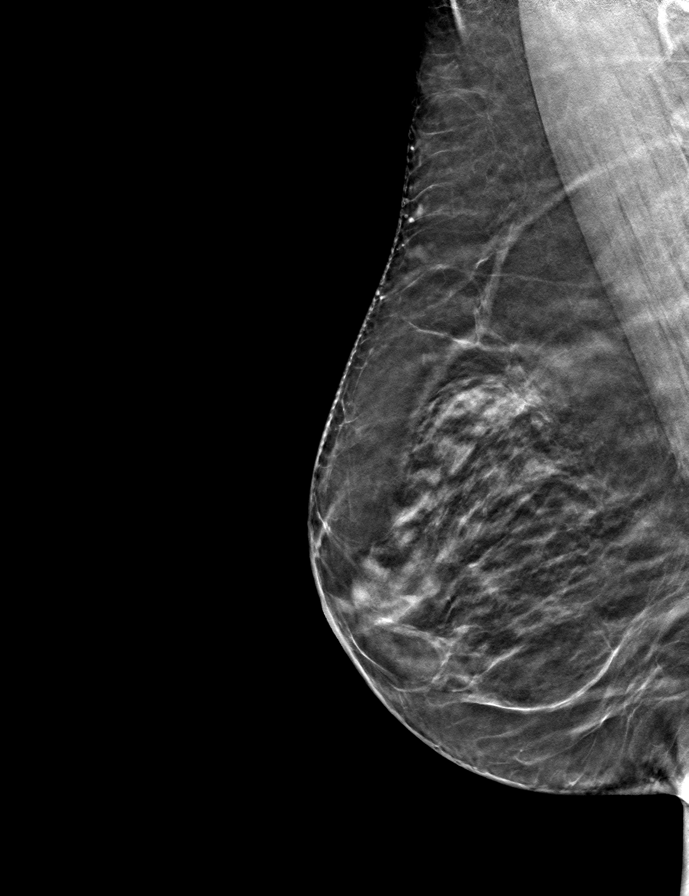

[L MLO tomo · tomo slice 25/49.0]
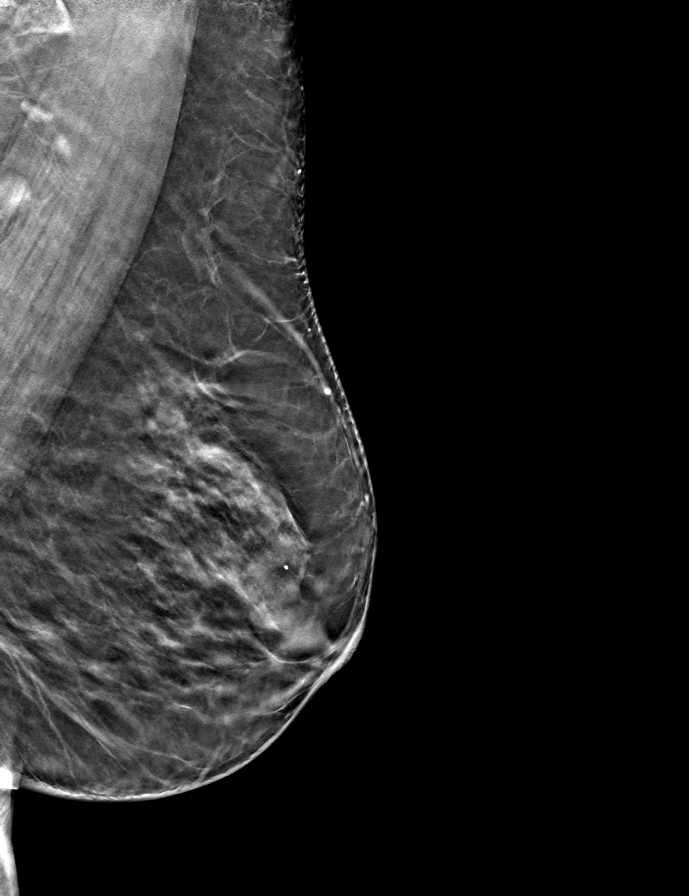

[R CC tomo · tomo slice 21/42.0]
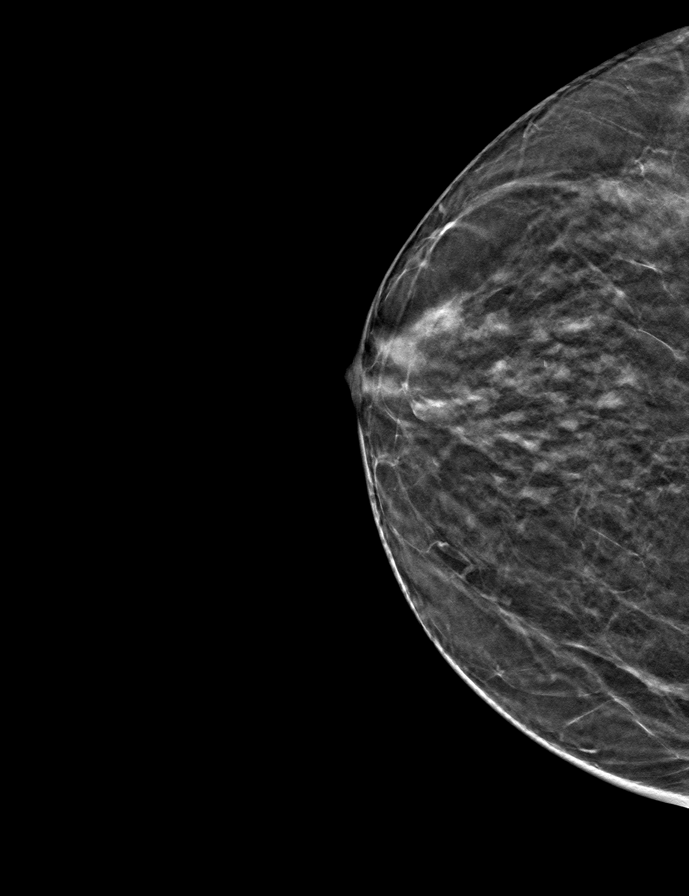

[L CC tomo · tomo slice 23/46.0]
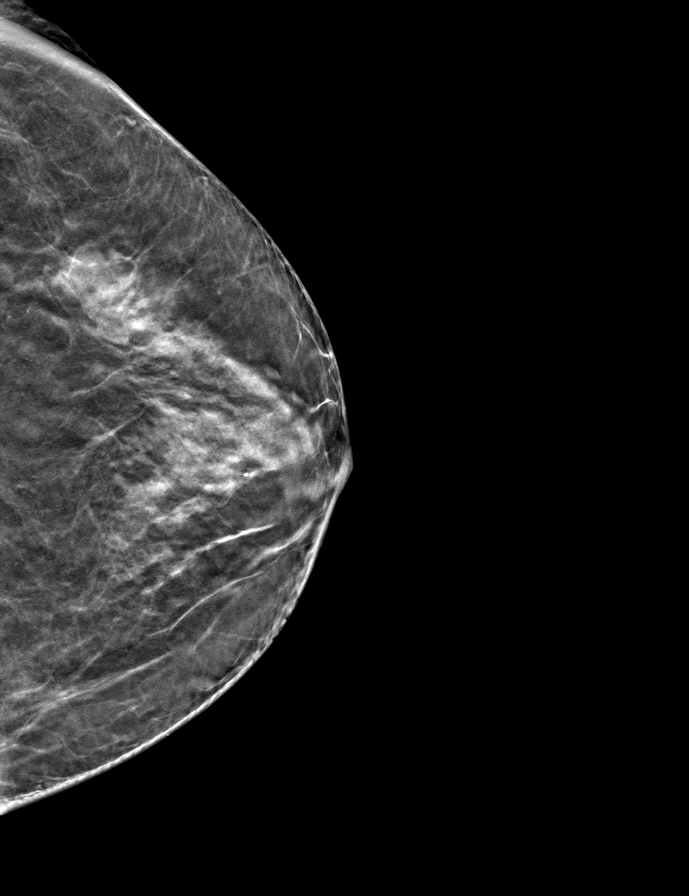

[9 of 24 positions shown; findings below may reference images not displayed]

ACR Breast Density Category c: The breast tissue is heterogeneously
dense, which may obscure small masses.
FINDINGS: There are no findings suspicious for malignancy. The images were
evaluated with computer-aided detection.
IMPRESSION: No mammographic evidence of malignancy. A result letter of this
screening mammogram will be mailed directly to the patient.

RECOMMENDATION:
Screening mammogram in one year. (Code:T4-5-GWO)

BI-RADS CATEGORY  1: Negative.

## 2022-12-24 ENCOUNTER — Ambulatory Visit: Payer: Medicare Other | Admitting: Cardiology

## 2022-12-29 DIAGNOSIS — G4733 Obstructive sleep apnea (adult) (pediatric): Secondary | ICD-10-CM | POA: Diagnosis not present

## 2022-12-29 DIAGNOSIS — D6869 Other thrombophilia: Secondary | ICD-10-CM | POA: Diagnosis not present

## 2022-12-29 DIAGNOSIS — Z9989 Dependence on other enabling machines and devices: Secondary | ICD-10-CM | POA: Diagnosis not present

## 2022-12-29 DIAGNOSIS — I10 Essential (primary) hypertension: Secondary | ICD-10-CM | POA: Diagnosis not present

## 2022-12-29 DIAGNOSIS — R7303 Prediabetes: Secondary | ICD-10-CM | POA: Diagnosis not present

## 2022-12-29 DIAGNOSIS — I700 Atherosclerosis of aorta: Secondary | ICD-10-CM | POA: Diagnosis not present

## 2022-12-29 DIAGNOSIS — I480 Paroxysmal atrial fibrillation: Secondary | ICD-10-CM | POA: Diagnosis not present

## 2022-12-29 DIAGNOSIS — E7800 Pure hypercholesterolemia, unspecified: Secondary | ICD-10-CM | POA: Diagnosis not present

## 2023-04-27 ENCOUNTER — Ambulatory Visit (HOSPITAL_COMMUNITY)
Admission: RE | Admit: 2023-04-27 | Discharge: 2023-04-27 | Disposition: A | Discharge status: Home / Self Care | Payer: Medicare Other | Source: Ambulatory Visit | Attending: Physician Assistant | Admitting: Physician Assistant

## 2023-04-27 ENCOUNTER — Encounter (HOSPITAL_COMMUNITY): Payer: Self-pay | Admitting: Physician Assistant

## 2023-04-27 VITALS — BP 132/90 | HR 72 | Ht 62.0 in | Wt 157.0 lb

## 2023-04-27 DIAGNOSIS — D6869 Other thrombophilia: Secondary | ICD-10-CM | POA: Diagnosis not present

## 2023-04-27 DIAGNOSIS — E785 Hyperlipidemia, unspecified: Secondary | ICD-10-CM | POA: Diagnosis not present

## 2023-04-27 DIAGNOSIS — I2510 Atherosclerotic heart disease of native coronary artery without angina pectoris: Secondary | ICD-10-CM | POA: Insufficient documentation

## 2023-04-27 DIAGNOSIS — Z7901 Long term (current) use of anticoagulants: Secondary | ICD-10-CM | POA: Diagnosis not present

## 2023-04-27 DIAGNOSIS — I480 Paroxysmal atrial fibrillation: Secondary | ICD-10-CM | POA: Diagnosis not present

## 2023-04-27 DIAGNOSIS — G4733 Obstructive sleep apnea (adult) (pediatric): Secondary | ICD-10-CM | POA: Insufficient documentation

## 2023-04-27 DIAGNOSIS — I10 Essential (primary) hypertension: Secondary | ICD-10-CM | POA: Insufficient documentation

## 2023-04-27 DIAGNOSIS — R9431 Abnormal electrocardiogram [ECG] [EKG]: Secondary | ICD-10-CM | POA: Insufficient documentation

## 2023-04-27 DIAGNOSIS — F17200 Nicotine dependence, unspecified, uncomplicated: Secondary | ICD-10-CM | POA: Insufficient documentation

## 2023-04-27 NOTE — Progress Notes (Signed)
Primary Care Physician: Sigmund Hazel, MD Primary Cardiologist: Dr McLean/Dr Elease Hashimoto Primary Electrophysiologist: Dr Elberta Fortis Referring Physician: Dr Tonny Bollman Kari Harrison is a 68 y.o. female with a history of hypertension, hyperlipidemia, coronary artery disease, tobacco abuse and paroxysmal atrial fibrillation who presents for follow up in the [facility].  The patient was initially diagnosed with atrial fibrillation 02/20/19 after presenting to the ER with symptoms of tachy palpitations and was found to be in afib with RVR. She was admitted and rate controlled on diltiazem drip. She was started on Eliquis for a CHADS2VASC score of 3. A TEE/DCCV was planned but she converted to SR on her own. She denies any alcohol use.   She was seen in the ED 04/30/22 for ongoing afib x one week. She was being seen at PCP office and had afib with RVR and soft BP. She was directed to the ED. The pt was found to mildly dehydrated and with a K+ of 3.2. She received fluids and K+ was repleted. She spontaneously converted to SR after discharge. She was started on Multaq and referred to EP to discuss ablation. She is s/p afib ablation with Dr Elberta Fortis on 07/24/22.  Patient returns for follow up for atrial fibrillation. She reports that since her ablation, she has had a total of 3 episodes of tachypalpitations. None lasted longer than 10-15 minutes. There were no specific triggers that she could identify.   Today, he denies symptoms of chest pain, orthopnea, PND, lower extremity edema, dizziness, presyncope, syncope, snoring, daytime somnolence, bleeding, or neurologic sequela. The patient is tolerating medications without difficulties and is otherwise without complaint today.    Atrial Fibrillation Risk Factors:  she does have symptoms or diagnosis of sleep apnea. she does not have a history of rheumatic fever. she does not have a history of alcohol use. The patient does not have a  history of early familial atrial fibrillation or other arrhythmias.   Atrial Fibrillation Management history:  Previous antiarrhythmic drugs: Multaq  Previous cardioversions: none Previous ablations: 07/24/22 Anticoagulation history: Eliquis   Past Medical History:  Diagnosis Date   CAD (coronary artery disease)    a. Cardiac CTA: severe 3VD;  b. 05/2014 Cath/PCI: LM nl, LAD 50-60p/m, 96m, D1 50ost/m, RI small/plaque, LCX dominant 20-30p, 53m, 60/90d (2.25x24 Promus DES), OM2 40-41m, RCA nondom, mod diff plaque, EF 55%.   Hyperlipidemia    Hypertension    Pneumonia 03/01/2020   Pulmonary nodules    a. 05/2014 CTA: left lung pulm nodules w/ rec for 3 month f/u noncontrast CT.   Tobacco abuse     Current Outpatient Medications  Medication Sig Dispense Refill   albuterol (VENTOLIN HFA) 108 (90 Base) MCG/ACT inhaler Inhale 2 puffs into the lungs every 6 (six) hours as needed.     apixaban (ELIQUIS) 5 MG TABS tablet Take 5 mg by mouth 2 (two) times daily.     Ascorbic Acid (VITAMIN C) 1000 MG tablet Take 1,000 mg by mouth daily.     atorvastatin (LIPITOR) 80 MG tablet take 1 tablet by mouth once daily ; MAKE DOCTOR'S APPOINTMENT ! 15 tablet 0   cholecalciferol (VITAMIN D3) 25 MCG (1000 UNIT) tablet Take 1,000 Units by mouth daily.     lisinopril-hydrochlorothiazide (ZESTORETIC) 20-12.5 MG tablet Take 2 tablets by mouth daily. Please make overdue appt with Dr. Shari Prows before anymore refills. Thank you 3rd and Final attempt 30 tablet 0   loratadine (CLARITIN) 10 MG tablet Take  10 mg by mouth daily as needed for allergies.     metoprolol succinate (TOPROL-XL) 50 MG 24 hr tablet Take 25 mg by mouth daily.     metoprolol tartrate (LOPRESSOR) 25 MG tablet Take 25 mg by mouth See admin instructions. Once daily with food as needed for pulse over 100     Multiple Minerals (CALCIUM/MAGNESIUM/ZINC PO) Take 1 tablet by mouth daily.     omeprazole (PRILOSEC) 20 MG capsule Take 20 mg by mouth daily.      venlafaxine XR (EFFEXOR-XR) 75 MG 24 hr capsule Take 75 mg by mouth daily.  0   No current facility-administered medications for this encounter.    ROS- All systems are reviewed and negative except as per the HPI above.  Physical Exam: Vitals:   04/27/23 1430  BP: (!) 132/90  Pulse: 72  Weight: 71.2 kg  Height: 5\' 2"  (1.575 m)    GEN: Well nourished, well developed in no acute distress CARDIAC: Regular rate and rhythm, no murmurs, rubs, gallops RESPIRATORY:  Clear to auscultation without rales, wheezing or rhonchi  ABDOMEN: Soft, non-tender, non-distended EXTREMITIES:  No edema; No deformity    Wt Readings from Last 3 Encounters:  04/27/23 71.2 kg  10/24/22 71 kg  08/21/22 70.1 kg    EKG today demonstrates  SR Vent. rate 72 BPM PR interval 164 ms QRS duration 74 ms QT/QTcB 394/431 ms   Echo 02/20/19 demonstrated  1. Left ventricular ejection fraction, by visual estimation, is 60 to 65%. The left ventricle has normal function. There is mildly increased left ventricular hypertrophy.  2. Left ventricular diastolic parameters are indeterminate.  3. Global right ventricle has normal systolic function.The right ventricular size is normal. No increase in right ventricular wall thickness.  4. Left atrial size was normal.  5. Right atrial size was normal.  6. Moderate aortic valve annular calcification.  7. The mitral valve is normal in structure. No evidence of mitral valve regurgitation. No evidence of mitral stenosis.  8. The tricuspid valve is normal in structure. Tricuspid valve regurgitation is not demonstrated.  9. The aortic valve was not well visualized. Aortic valve regurgitation is not visualized. No evidence of aortic valve sclerosis or stenosis. 10. There is Moderate calcification of the aortic valve. 11. There is Moderate thickening of the aortic valve. 12. The pulmonic valve was not well visualized. Pulmonic valve regurgitation is not visualized. 13. The  inferior vena cava is normal in size with greater than 50% respiratory variability, suggesting right atrial pressure of 3 mmHg.    Epic records are reviewed at length today   CHA2DS2-VASc Score = 4  The patient's score is based upon: CHF History: 0 HTN History: 1 Diabetes History: 0 Stroke History: 0 Vascular Disease History: 1 Age Score: 1 Gender Score: 1       ASSESSMENT AND PLAN: Paroxysmal Atrial Fibrillation (ICD10:  I48.0) The patient's CHA2DS2-VASc score is 4, indicating a 4.8% annual risk of stroke.   S/p afib ablation 07/24/22, now off Multaq Patient appears to be maintaining SR Continue Toprol 25 mg daily with Lopressor 25 mg PRN q 6 hours for heart racing. Continue Eliquis 5 mg BID  Secondary Hypercoagulable State (ICD10:  D68.69) The patient is at significant risk for stroke/thromboembolism based upon her CHA2DS2-VASc Score of 4.  Continue Apixaban (Eliquis). No bleeding issues.   OSA  Patient deferred CPAP for now  CAD S/p PCI 2016. Low risk myoview 2021 No anginal symptoms  HTN Stable on current regimen  Follow up in the AF clinic in 6 months.    Jorja Loa PA-C Afib Clinic [facility] [address] [place], [place] [address] [phone]

## 2023-04-30 DIAGNOSIS — Z1211 Encounter for screening for malignant neoplasm of colon: Secondary | ICD-10-CM | POA: Diagnosis not present

## 2023-05-04 DIAGNOSIS — Z0000 Encounter for general adult medical examination without abnormal findings: Secondary | ICD-10-CM | POA: Diagnosis not present

## 2023-05-29 ENCOUNTER — Other Ambulatory Visit: Payer: Self-pay | Admitting: Family Medicine

## 2023-05-29 DIAGNOSIS — Z1231 Encounter for screening mammogram for malignant neoplasm of breast: Secondary | ICD-10-CM

## 2023-06-29 ENCOUNTER — Ambulatory Visit
Admission: RE | Admit: 2023-06-29 | Discharge: 2023-06-29 | Disposition: A | Discharge status: Home / Self Care | Payer: Medicare Other | Source: Ambulatory Visit | Attending: Family Medicine | Admitting: Family Medicine

## 2023-06-29 DIAGNOSIS — E7800 Pure hypercholesterolemia, unspecified: Secondary | ICD-10-CM | POA: Diagnosis not present

## 2023-06-29 DIAGNOSIS — I10 Essential (primary) hypertension: Secondary | ICD-10-CM | POA: Diagnosis not present

## 2023-06-29 DIAGNOSIS — K219 Gastro-esophageal reflux disease without esophagitis: Secondary | ICD-10-CM | POA: Diagnosis not present

## 2023-06-29 DIAGNOSIS — R7303 Prediabetes: Secondary | ICD-10-CM | POA: Diagnosis not present

## 2023-06-29 DIAGNOSIS — Z1231 Encounter for screening mammogram for malignant neoplasm of breast: Secondary | ICD-10-CM

## 2023-06-29 DIAGNOSIS — Z87891 Personal history of nicotine dependence: Secondary | ICD-10-CM | POA: Diagnosis not present

## 2023-06-29 DIAGNOSIS — I480 Paroxysmal atrial fibrillation: Secondary | ICD-10-CM | POA: Diagnosis not present

## 2023-09-10 DIAGNOSIS — L243 Irritant contact dermatitis due to cosmetics: Secondary | ICD-10-CM | POA: Diagnosis not present

## 2023-09-28 DIAGNOSIS — E7800 Pure hypercholesterolemia, unspecified: Secondary | ICD-10-CM | POA: Diagnosis not present

## 2023-09-28 DIAGNOSIS — I10 Essential (primary) hypertension: Secondary | ICD-10-CM | POA: Diagnosis not present

## 2023-09-28 DIAGNOSIS — I480 Paroxysmal atrial fibrillation: Secondary | ICD-10-CM | POA: Diagnosis not present

## 2023-10-28 ENCOUNTER — Ambulatory Visit (HOSPITAL_COMMUNITY)
Admission: RE | Admit: 2023-10-28 | Discharge: 2023-10-28 | Disposition: A | Discharge status: Home / Self Care | Payer: Medicare Other | Source: Ambulatory Visit | Attending: Physician Assistant | Admitting: Physician Assistant

## 2023-10-28 ENCOUNTER — Encounter (HOSPITAL_COMMUNITY): Payer: Self-pay | Admitting: Physician Assistant

## 2023-10-28 VITALS — BP 122/78 | HR 66 | Ht 62.0 in | Wt 156.4 lb

## 2023-10-28 DIAGNOSIS — I480 Paroxysmal atrial fibrillation: Secondary | ICD-10-CM

## 2023-10-28 DIAGNOSIS — D6869 Other thrombophilia: Secondary | ICD-10-CM

## 2023-10-28 NOTE — Progress Notes (Signed)
 Primary Care Physician: Cleotilde Planas, MD Primary Cardiologist: Dr McLean/Dr Alveta Primary Electrophysiologist: Dr Inocencio Referring Physician: Dr Cindy Kid Kari Harrison is a 68 y.o. female with a history of hypertension, hyperlipidemia, coronary artery disease, tobacco abuse and paroxysmal atrial fibrillation who presents for follow up in the [facility].  The patient was initially diagnosed with atrial fibrillation 02/20/19 after presenting to the ER with symptoms of tachy palpitations and was found to be in afib with RVR. She was admitted and rate controlled on diltiazem  drip. She was started on Eliquis  for a CHADS2VASC score of 3. A TEE/DCCV was planned but she converted to SR on her own. She denies any alcohol use.   She was seen in the ED 04/30/22 for ongoing afib x one week. She was being seen at PCP office and had afib with RVR and soft BP. She was directed to the ED. The pt was found to mildly dehydrated and with a K+ of 3.2. She received fluids and K+ was repleted. She spontaneously converted to SR after discharge. She was started on Multaq  and referred to EP to discuss ablation. She is s/p afib ablation with Dr Inocencio on 07/24/22. Multaq  discontinued post ablation.   Patient returns for follow up for atrial fibrillation. She reports that she has done very well since her last visit. She has not had any episodes of tachypalpitations or episodes noted on her smart watch. No bleeding issues on anticoagulation.   Today, she  denies symptoms of palpitations, chest pain, shortness of breath, orthopnea, PND, lower extremity edema, dizziness, presyncope, syncope, bleeding, or neurologic sequela. The patient is tolerating medications without difficulties and is otherwise without complaint today.    Atrial Fibrillation Risk Factors:  she does have symptoms or diagnosis of sleep apnea. she does not have a history of rheumatic fever. she does not have a history of  alcohol use. The patient does not have a history of early familial atrial fibrillation or other arrhythmias.   Atrial Fibrillation Management history:  Previous antiarrhythmic drugs: Multaq   Previous cardioversions: none Previous ablations: 07/24/22 Anticoagulation history: Eliquis    Past Medical History:  Diagnosis Date   CAD (coronary artery disease)    a. Cardiac CTA: severe 3VD;  b. 05/2014 Cath/PCI: LM nl, LAD 50-60p/m, 43m, D1 50ost/m, RI small/plaque, LCX dominant 20-30p, 47m, 60/90d (2.25x24 Promus DES), OM2 40-88m, RCA nondom, mod diff plaque, EF 55%.   Hyperlipidemia    Hypertension    Pneumonia 03/01/2020   Pulmonary nodules    a. 05/2014 CTA: left lung pulm nodules w/ rec for 3 month f/u noncontrast CT.   Tobacco abuse     Current Outpatient Medications  Medication Sig Dispense Refill   albuterol (VENTOLIN HFA) 108 (90 Base) MCG/ACT inhaler Inhale 2 puffs into the lungs every 6 (six) hours as needed.     apixaban  (ELIQUIS ) 5 MG TABS tablet Take 5 mg by mouth 2 (two) times daily.     Ascorbic Acid  (VITAMIN C) 1000 MG tablet Take 1,000 mg by mouth daily.     atorvastatin  (LIPITOR ) 80 MG tablet take 1 tablet by mouth once daily ; MAKE DOCTOR'S APPOINTMENT ! 15 tablet 0   cholecalciferol (VITAMIN D3) 25 MCG (1000 UNIT) tablet Take 1,000 Units by mouth daily.     lisinopril -hydrochlorothiazide  (ZESTORETIC ) 20-12.5 MG tablet Take 2 tablets by mouth daily. Please make overdue appt with Dr. Hobart before anymore refills. Thank you 3rd and Final attempt 30 tablet 0  loratadine  (CLARITIN ) 10 MG tablet Take 10 mg by mouth daily as needed for allergies.     metoprolol  succinate (TOPROL -XL) 50 MG 24 hr tablet Take 25 mg by mouth daily.     metoprolol  tartrate (LOPRESSOR ) 25 MG tablet Take 25 mg by mouth See admin instructions. Once daily with food as needed for pulse over 100     Multiple Minerals (CALCIUM /MAGNESIUM /ZINC  PO) Take 1 tablet by mouth daily.     omeprazole (PRILOSEC)  20 MG capsule Take 20 mg by mouth daily.     venlafaxine  XR (EFFEXOR -XR) 75 MG 24 hr capsule Take 75 mg by mouth daily.  0   No current facility-administered medications for this encounter.    ROS- All systems are reviewed and negative except as per the HPI above.  Physical Exam: Vitals:   10/28/23 0855  BP: 122/78  Pulse: 66  Weight: 70.9 kg  Height: 5' 2 (1.575 m)    GEN: Well nourished, well developed in no acute distress CARDIAC: Regular rate and rhythm, no murmurs, rubs, gallops RESPIRATORY:  Clear to auscultation without rales, wheezing or rhonchi  ABDOMEN: Soft, non-tender, non-distended EXTREMITIES:  No edema; No deformity    Wt Readings from Last 3 Encounters:  10/28/23 70.9 kg  04/27/23 71.2 kg  10/24/22 71 kg    EKG today demonstrates  SR Vent. rate 66 BPM PR interval 164 ms QRS duration 72 ms QT/QTcB 386/404 ms   Echo 02/20/19 demonstrated  1. Left ventricular ejection fraction, by visual estimation, is 60 to 65%. The left ventricle has normal function. There is mildly increased left ventricular hypertrophy.  2. Left ventricular diastolic parameters are indeterminate.  3. Global right ventricle has normal systolic function.The right ventricular size is normal. No increase in right ventricular wall thickness.  4. Left atrial size was normal.  5. Right atrial size was normal.  6. Moderate aortic valve annular calcification.  7. The mitral valve is normal in structure. No evidence of mitral valve regurgitation. No evidence of mitral stenosis.  8. The tricuspid valve is normal in structure. Tricuspid valve regurgitation is not demonstrated.  9. The aortic valve was not well visualized. Aortic valve regurgitation is not visualized. No evidence of aortic valve sclerosis or stenosis. 10. There is Moderate calcification of the aortic valve. 11. There is Moderate thickening of the aortic valve. 12. The pulmonic valve was not well visualized. Pulmonic valve  regurgitation is not visualized. 13. The inferior vena cava is normal in size with greater than 50% respiratory variability, suggesting right atrial pressure of 3 mmHg.    Epic records are reviewed at length today   CHA2DS2-VASc Score = 4  The patient's score is based upon: CHF History: 0 HTN History: 1 Diabetes History: 0 Stroke History: 0 Vascular Disease History: 1 Age Score: 1 Gender Score: 1       ASSESSMENT AND PLAN: Paroxysmal Atrial Fibrillation (ICD10:  I48.0) The patient's CHA2DS2-VASc score is 4, indicating a 4.8% annual risk of stroke.   S/p afib ablation 07/24/22 Patient appears to be maintaining SR Continue Toprol  25 mg daily with Lopressor  25 mg PRN q 6 hours for heart racing.  Continue Eliquis  5 mg BID  Secondary Hypercoagulable State (ICD10:  D68.69) The patient is at significant risk for stroke/thromboembolism based upon her CHA2DS2-VASc Score of 4.  Continue Apixaban  (Eliquis ). No bleeding issues.   OSA  Patient is not currently on CPAP  CAD S/p PCI 2016 No anginal symptoms  HTN Stable on current regimen  Follow up with Dr Inocencio in one year.    Daril Kicks PA-C Afib Clinic [facility] [address] [place], [place] [address] [phone]

## 2023-10-29 DIAGNOSIS — I480 Paroxysmal atrial fibrillation: Secondary | ICD-10-CM | POA: Diagnosis not present

## 2023-10-29 DIAGNOSIS — I10 Essential (primary) hypertension: Secondary | ICD-10-CM | POA: Diagnosis not present

## 2023-10-29 DIAGNOSIS — E7800 Pure hypercholesterolemia, unspecified: Secondary | ICD-10-CM | POA: Diagnosis not present

## 2023-11-29 DIAGNOSIS — I10 Essential (primary) hypertension: Secondary | ICD-10-CM | POA: Diagnosis not present

## 2023-11-29 DIAGNOSIS — E7800 Pure hypercholesterolemia, unspecified: Secondary | ICD-10-CM | POA: Diagnosis not present

## 2023-11-29 DIAGNOSIS — I480 Paroxysmal atrial fibrillation: Secondary | ICD-10-CM | POA: Diagnosis not present

## 2023-12-29 DIAGNOSIS — I480 Paroxysmal atrial fibrillation: Secondary | ICD-10-CM | POA: Diagnosis not present

## 2023-12-29 DIAGNOSIS — I10 Essential (primary) hypertension: Secondary | ICD-10-CM | POA: Diagnosis not present

## 2023-12-29 DIAGNOSIS — E7800 Pure hypercholesterolemia, unspecified: Secondary | ICD-10-CM | POA: Diagnosis not present

## 2023-12-30 DIAGNOSIS — I480 Paroxysmal atrial fibrillation: Secondary | ICD-10-CM | POA: Diagnosis not present

## 2023-12-30 DIAGNOSIS — K219 Gastro-esophageal reflux disease without esophagitis: Secondary | ICD-10-CM | POA: Diagnosis not present

## 2023-12-30 DIAGNOSIS — E7800 Pure hypercholesterolemia, unspecified: Secondary | ICD-10-CM | POA: Diagnosis not present

## 2023-12-30 DIAGNOSIS — R7303 Prediabetes: Secondary | ICD-10-CM | POA: Diagnosis not present

## 2023-12-30 DIAGNOSIS — I10 Essential (primary) hypertension: Secondary | ICD-10-CM | POA: Diagnosis not present

## 2023-12-30 DIAGNOSIS — Z23 Encounter for immunization: Secondary | ICD-10-CM | POA: Diagnosis not present
